# Patient Record
Sex: Male | Born: 1960 | Race: White | Hispanic: No | Marital: Married | Smoking: Current every day smoker
Health system: Southern US, Community
[De-identification: ages and names within clinical notes are randomized; demographics above are authoritative.]

## PROBLEM LIST (undated history)

## (undated) DIAGNOSIS — M792 Neuralgia and neuritis, unspecified: Secondary | ICD-10-CM

## (undated) DIAGNOSIS — M1990 Unspecified osteoarthritis, unspecified site: Secondary | ICD-10-CM

## (undated) DIAGNOSIS — I2510 Atherosclerotic heart disease of native coronary artery without angina pectoris: Secondary | ICD-10-CM

---

## 1999-11-25 ENCOUNTER — Encounter: Payer: Self-pay | Admitting: Neurosurgery

## 1999-11-29 ENCOUNTER — Observation Stay (HOSPITAL_COMMUNITY): Admission: RE | Admit: 1999-11-29 | Discharge: 1999-11-29 | Payer: Self-pay | Admitting: Neurosurgery

## 1999-11-29 ENCOUNTER — Encounter: Payer: Self-pay | Admitting: Neurosurgery

## 1999-12-13 ENCOUNTER — Encounter: Payer: Self-pay | Admitting: Neurosurgery

## 1999-12-13 ENCOUNTER — Encounter: Admission: RE | Admit: 1999-12-13 | Discharge: 1999-12-13 | Payer: Self-pay | Admitting: Neurosurgery

## 2000-01-16 ENCOUNTER — Encounter: Payer: Self-pay | Admitting: Neurosurgery

## 2000-01-16 ENCOUNTER — Encounter: Admission: RE | Admit: 2000-01-16 | Discharge: 2000-01-16 | Payer: Self-pay | Admitting: Neurosurgery

## 2014-03-13 ENCOUNTER — Other Ambulatory Visit: Payer: Self-pay | Admitting: Internal Medicine

## 2014-03-15 ENCOUNTER — Other Ambulatory Visit: Payer: Self-pay | Admitting: Internal Medicine

## 2016-11-11 ENCOUNTER — Emergency Department (HOSPITAL_BASED_OUTPATIENT_CLINIC_OR_DEPARTMENT_OTHER)
Admission: EM | Admit: 2016-11-11 | Discharge: 2016-11-11 | Disposition: A | Discharge status: Home / Self Care | Payer: Managed Care, Other (non HMO) | Attending: Emergency Medicine | Admitting: Emergency Medicine

## 2016-11-11 ENCOUNTER — Encounter (HOSPITAL_BASED_OUTPATIENT_CLINIC_OR_DEPARTMENT_OTHER): Payer: Self-pay | Admitting: Adult Health

## 2016-11-11 DIAGNOSIS — Z791 Long term (current) use of non-steroidal anti-inflammatories (NSAID): Secondary | ICD-10-CM | POA: Diagnosis not present

## 2016-11-11 DIAGNOSIS — F17210 Nicotine dependence, cigarettes, uncomplicated: Secondary | ICD-10-CM | POA: Insufficient documentation

## 2016-11-11 DIAGNOSIS — L03114 Cellulitis of left upper limb: Secondary | ICD-10-CM | POA: Diagnosis not present

## 2016-11-11 DIAGNOSIS — L02219 Cutaneous abscess of trunk, unspecified: Secondary | ICD-10-CM

## 2016-11-11 DIAGNOSIS — L02211 Cutaneous abscess of abdominal wall: Secondary | ICD-10-CM | POA: Diagnosis present

## 2016-11-11 DIAGNOSIS — L03311 Cellulitis of abdominal wall: Secondary | ICD-10-CM | POA: Diagnosis not present

## 2016-11-11 DIAGNOSIS — L03319 Cellulitis of trunk, unspecified: Secondary | ICD-10-CM

## 2016-11-11 HISTORY — DX: Neuralgia and neuritis, unspecified: M79.2

## 2016-11-11 HISTORY — DX: Unspecified osteoarthritis, unspecified site: M19.90

## 2016-11-11 LAB — BASIC METABOLIC PANEL
ANION GAP: 8 (ref 5–15)
BUN: 16 mg/dL (ref 6–20)
CALCIUM: 8.8 mg/dL — AB (ref 8.9–10.3)
CO2: 23 mmol/L (ref 22–32)
Chloride: 106 mmol/L (ref 101–111)
Creatinine, Ser: 1.21 mg/dL (ref 0.61–1.24)
GLUCOSE: 96 mg/dL (ref 65–99)
Potassium: 4.1 mmol/L (ref 3.5–5.1)
Sodium: 137 mmol/L (ref 135–145)

## 2016-11-11 LAB — CBC WITH DIFFERENTIAL/PLATELET
BASOS ABS: 0 10*3/uL (ref 0.0–0.1)
BASOS PCT: 0 %
EOS ABS: 0.3 10*3/uL (ref 0.0–0.7)
Eosinophils Relative: 3 %
HCT: 46.6 % (ref 39.0–52.0)
Hemoglobin: 15.8 g/dL (ref 13.0–17.0)
Lymphocytes Relative: 24 %
Lymphs Abs: 2 10*3/uL (ref 0.7–4.0)
MCH: 30.9 pg (ref 26.0–34.0)
MCHC: 33.9 g/dL (ref 30.0–36.0)
MCV: 91.2 fL (ref 78.0–100.0)
MONO ABS: 0.9 10*3/uL (ref 0.1–1.0)
Monocytes Relative: 11 %
Neutro Abs: 5 10*3/uL (ref 1.7–7.7)
Neutrophils Relative %: 62 %
PLATELETS: 163 10*3/uL (ref 150–400)
RBC: 5.11 MIL/uL (ref 4.22–5.81)
RDW: 13.9 % (ref 11.5–15.5)
WBC: 8.2 10*3/uL (ref 4.0–10.5)

## 2016-11-11 MED ORDER — CEPHALEXIN 250 MG PO CAPS
500.0000 mg | ORAL_CAPSULE | Freq: Once | ORAL | Status: AC
  Administered 2016-11-11: 500 mg via ORAL
  Filled 2016-11-11: qty 2

## 2016-11-11 MED ORDER — SULFAMETHOXAZOLE-TRIMETHOPRIM 800-160 MG PO TABS
1.0000 | ORAL_TABLET | Freq: Once | ORAL | Status: AC
  Administered 2016-11-11: 1 via ORAL
  Filled 2016-11-11: qty 1

## 2016-11-11 MED ORDER — SULFAMETHOXAZOLE-TRIMETHOPRIM 800-160 MG PO TABS: 1.0000 | ORAL_TABLET | Freq: Two times a day (BID) | ORAL | 0 refills | Status: AC

## 2016-11-11 MED ORDER — CEPHALEXIN 500 MG PO CAPS
500.0000 mg | ORAL_CAPSULE | Freq: Four times a day (QID) | ORAL | 0 refills | Status: DC
Start: 2016-11-11 — End: 2017-08-28

## 2016-11-11 MED ORDER — MUPIROCIN CALCIUM 2 % NA OINT: TOPICAL_OINTMENT | NASAL | 0 refills | Status: DC

## 2016-11-11 NOTE — ED Notes (Signed)
EDPA at [facility], wife at [facility], pt al;ert, NAD, calm, interactive.

## 2016-11-11 NOTE — Discharge Instructions (Signed)
Please read and follow all provided instructions.  Your diagnoses today include:  1. Cellulitis and abscess of trunk   2. Cellulitis of left hand     Tests performed today include:  Vital signs. See below for your results today.   Medications prescribed:   Bactrim (trimethoprim/sulfamethoxazole) - antibiotic  You have been prescribed an antibiotic medicine: take the entire course of medicine even if you are feeling better. Stopping early can cause the antibiotic not to work.   Keflex (cephalexin) - antibiotic  You have been prescribed an antibiotic medicine: take the entire course of medicine even if you are feeling better. Stopping early can cause the antibiotic not to work.  Take any prescribed medications only as directed.   Home care instructions:   Follow any educational materials contained in this packet  Follow-up instructions: Please follow-up with your primary care provider in 48 hours for a recheck.   Return instructions:  Return to the Emergency Department if you have:  Fever  Worsening symptoms  Worsening pain  Worsening swelling  Redness of the skin that moves away from the affected area, especially if it streaks away from the affected area   Any other emergent concerns  Additional Information: If you have recurrent abscesses, try both the following. Use a Qtip to apply an over-the-counter antibiotic to the inside of your nostrils, twice a day for 5 days. Wash your body with over-the-counter Hibaclens once a day for one week and then once every two weeks. This can reduce the amount of bacterial on your skin that causes boils and lead to fewer boils. If you continue to have multiple or recurrent boils, you should see a dermatologist (skin doctor).   Your vital signs today were: BP 144/89 (BP Location: Left Arm)    Pulse 80    Temp 98 F (36.7 C) (Oral)    Resp 18    Ht 5\' 11"  (1.803 m)    Wt 111.1 kg    SpO2 97%    BMI 34.17 kg/m  If your blood  pressure (BP) was elevated above 135/85 this visit, please have this repeated by your doctor within one month. --------------

## 2016-11-11 NOTE — ED Provider Notes (Signed)
[facility] Provider Note   CSN: [id number] Arrival date & time: 11/11/16  2006  By signing my name below, I, Emmanuella Mensah, attest that this documentation has been prepared under the direction and in the presence of Renne Crigler, PA-C. Electronically Signed: Angelene Giovanni, ED Scribe. 11/11/16. 10:10 PM.   History   Chief Complaint Chief Complaint  Patient presents with  . Abscess    HPI Comments: Larry [name] is a 55 y.o. male who presents to the Emergency Department complaining of gradually worsening moderately painful area of swelling, induration, and redness to the LLQ of his abdomen onset one week ago. He notes that the area began as a pimple and as it grew, he cut into the area with positive purulent drainage. No other alleviating factors noted. Pt has not tried any medications PTA. He has NKDA. He denies that he has been evaluated by a provider for these symptoms. He states that he has these recurrent areas in multiple places on his body, mainly his left hand. He denies any fever, chills, nausea, vomiting, or any other symptoms.   The history is provided by the patient. No language interpreter was used.    Past Medical History:  Diagnosis Date  . Arthritis   . Nerve pain     There are no active problems to display for this patient.   History reviewed. No pertinent surgical history.     Home Medications    Prior to Admission medications   Medication Sig Start Date End Date Taking? Authorizing Provider  gabapentin (NEURONTIN) 300 MG capsule Take 300 mg by mouth 3 (three) times daily.   Yes Historical Provider, MD  meloxicam (MOBIC) 15 MG tablet Take 15 mg by mouth daily.   Yes Historical Provider, MD  naproxen (NAPROSYN) 500 MG tablet Take 500 mg by mouth 2 (two) times daily with a meal.   Yes Historical Provider, MD  zolpidem (AMBIEN) 10 MG tablet Take 10 mg by mouth at bedtime as needed for sleep.   Yes Historical Provider, MD    Family  History History reviewed. No pertinent family history.  Social History Social History  Substance Use Topics  . Smoking status: Current Every Day Smoker    Types: Cigarettes  . Smokeless tobacco: Never Used  . Alcohol use No     Allergies   Patient has no known allergies.   Review of Systems Review of Systems  Constitutional: Negative for chills and fever.  HENT: Negative for rhinorrhea and sore throat.   Eyes: Negative for redness.  Respiratory: Negative for cough.   Cardiovascular: Negative for chest pain.  Gastrointestinal: Negative for abdominal pain, diarrhea, nausea and vomiting.  Genitourinary: Negative for dysuria.  Musculoskeletal: Negative for myalgias.  Skin: Positive for color change and wound. Negative for rash.       Positive for abscess  Neurological: Negative for headaches.  Hematological: Negative for adenopathy.   Physical Exam Updated Vital Signs BP 144/89 (BP Location: Left Arm)   Pulse 80   Temp 98 F (36.7 C) (Oral)   Resp 18   Ht 5\' 11"  (1.803 m)   Wt 245 lb (111.1 kg)   SpO2 97%   BMI 34.17 kg/m   Physical Exam  Constitutional: He appears well-developed and well-nourished.  HENT:  Head: Normocephalic and atraumatic.  Eyes: Conjunctivae are normal.  Neck: Normal range of motion. Neck supple.  Cardiovascular: Normal rate.   Pulmonary/Chest: Effort normal. No respiratory distress.  Musculoskeletal:  Left hand: 3  cm irregular area of cellulitis on the dorsum of the hand, between thumb and index fingers.   Left lower abdomen: 6cm x 3cm area of cellulitis and healing abscess. No drainage.   Neurological: He is alert.  Skin: Skin is warm and dry.  Psychiatric: He has a normal mood and affect.  Nursing note and vitals reviewed.        ED Treatments / Results  DIAGNOSTIC STUDIES: Oxygen Saturation is 97% on RA, normal by my interpretation.    COORDINATION OF CARE: 10:09 PM- Pt advised of plan for treatment and pt agrees. Pt will  receive Keflex and Bactrim. Advised to follow up with PCP. Recommended to use OTC Hibaclens and Bactroban for one week. Pt will keep area covered while at work.    Labs (all labs ordered are listed, but only abnormal results are displayed) Labs Reviewed  BASIC METABOLIC PANEL - Abnormal; Notable for the following:       Result Value   Calcium 8.8 (*)    All other components within normal limits  CBC WITH DIFFERENTIAL/PLATELET    Procedures Procedures (including critical care time)  Medications Ordered in ED Medications  sulfamethoxazole-trimethoprim (BACTRIM DS,SEPTRA DS) 800-160 MG per tablet 1 tablet (1 tablet Oral Given 11/11/16 2209)  cephALEXin (KEFLEX) capsule 500 mg (500 mg Oral Given 11/11/16 2210)     Initial Impression / Assessment and Plan / ED Course  Renne Crigler, PA-C has reviewed the triage vital signs and the nursing notes.  Pertinent labs & imaging results that were available during my care of the patient were reviewed by me and considered in my medical decision making (see chart for details).  Clinical Course    Patient seen and examined. Work-up initiated. Medications ordered.   Vital signs reviewed and are as follows: BP 132/78 (BP Location: Right Arm)   Pulse 64   Temp 98 F (36.7 C) (Oral)   Resp 18   Ht 5\' 11"  (1.803 m)   Wt 111.1 kg   SpO2 96%   BMI 34.17 kg/m   Patient updated on blood work results.   The patient was urged to return to the Emergency Department urgently with worsening pain, swelling, expanding erythema especially if it streaks away from the affected area, fever, or if they have any other concerns.   The patient was urged to return to the Emergency Department or go to their PCP in 48-72 hours for wound recheck.  The patient verbalized understanding and stated agreement with this plan.   Final Clinical Impressions(s) / ED Diagnoses   Final diagnoses:  Cellulitis and abscess of trunk  Cellulitis of left hand    New  Prescriptions Discharge Medication List as of 11/11/2016 10:51 PM    START taking these medications   Details  cephALEXin (KEFLEX) 500 MG capsule Take 1 capsule (500 mg total) by mouth 4 (four) times daily., Starting Sat 11/11/2016, Print    mupirocin nasal ointment (BACTROBAN) 2 % Apply in each nostril twice daily for 5 days., Print    sulfamethoxazole-trimethoprim (BACTRIM DS,SEPTRA DS) 800-160 MG tablet Take 1 tablet by mouth 2 (two) times daily., Starting Sat 11/11/2016, Until Sat 11/18/2016, Print       I personally performed the services described in this documentation, which was scribed in my presence. The recorded information has been reviewed and is accurate.     Renne Crigler, PA-C 11/11/16 6812    Arby Barrette, MD 11/13/16 0030

## 2016-11-11 NOTE — ED Notes (Signed)
ED PA at [facility] 

## 2016-11-11 NOTE — ED Triage Notes (Signed)
Presents with two red, indurated areas, one to left hand and one to left lower abdomen. He endorses pain and purulent drainage. Endorses cutting into the one on his hand and squeezing the opus out a couple days ago. Both sites are indurated and red. Denies fevers, denies chills reports not feeling well a couple days ago but is feeling okay today.

## 2017-08-28 ENCOUNTER — Inpatient Hospital Stay (HOSPITAL_COMMUNITY): Payer: 59

## 2017-08-28 ENCOUNTER — Inpatient Hospital Stay (HOSPITAL_COMMUNITY)
Admission: EM | Admit: 2017-08-28 | Discharge: 2017-08-30 | DRG: 638 | Disposition: A | Discharge status: Home / Self Care | Payer: 59 | Attending: Internal Medicine | Admitting: Internal Medicine

## 2017-08-28 ENCOUNTER — Emergency Department (HOSPITAL_COMMUNITY): Payer: 59

## 2017-08-28 ENCOUNTER — Encounter (HOSPITAL_COMMUNITY): Payer: Self-pay | Admitting: Family Medicine

## 2017-08-28 DIAGNOSIS — M4802 Spinal stenosis, cervical region: Secondary | ICD-10-CM | POA: Diagnosis not present

## 2017-08-28 DIAGNOSIS — R739 Hyperglycemia, unspecified: Secondary | ICD-10-CM | POA: Diagnosis present

## 2017-08-28 DIAGNOSIS — D696 Thrombocytopenia, unspecified: Secondary | ICD-10-CM | POA: Diagnosis present

## 2017-08-28 DIAGNOSIS — R7989 Other specified abnormal findings of blood chemistry: Secondary | ICD-10-CM | POA: Diagnosis not present

## 2017-08-28 DIAGNOSIS — T380X5A Adverse effect of glucocorticoids and synthetic analogues, initial encounter: Secondary | ICD-10-CM | POA: Diagnosis present

## 2017-08-28 DIAGNOSIS — M1990 Unspecified osteoarthritis, unspecified site: Secondary | ICD-10-CM | POA: Diagnosis present

## 2017-08-28 DIAGNOSIS — E0910 Drug or chemical induced diabetes mellitus with ketoacidosis without coma: Secondary | ICD-10-CM

## 2017-08-28 DIAGNOSIS — Z886 Allergy status to analgesic agent status: Secondary | ICD-10-CM | POA: Diagnosis not present

## 2017-08-28 DIAGNOSIS — Z79899 Other long term (current) drug therapy: Secondary | ICD-10-CM

## 2017-08-28 DIAGNOSIS — E1110 Type 2 diabetes mellitus with ketoacidosis without coma: Secondary | ICD-10-CM | POA: Diagnosis present

## 2017-08-28 DIAGNOSIS — R945 Abnormal results of liver function studies: Secondary | ICD-10-CM

## 2017-08-28 DIAGNOSIS — Z7952 Long term (current) use of systemic steroids: Secondary | ICD-10-CM | POA: Diagnosis not present

## 2017-08-28 DIAGNOSIS — E871 Hypo-osmolality and hyponatremia: Secondary | ICD-10-CM | POA: Diagnosis present

## 2017-08-28 DIAGNOSIS — Z791 Long term (current) use of non-steroidal anti-inflammatories (NSAID): Secondary | ICD-10-CM

## 2017-08-28 DIAGNOSIS — F17210 Nicotine dependence, cigarettes, uncomplicated: Secondary | ICD-10-CM | POA: Diagnosis present

## 2017-08-28 HISTORY — DX: Thrombocytopenia, unspecified: D69.6

## 2017-08-28 LAB — URINALYSIS, ROUTINE W REFLEX MICROSCOPIC
Bacteria, UA: NONE SEEN
Bilirubin Urine: NEGATIVE
HGB URINE DIPSTICK: NEGATIVE
KETONES UR: 5 mg/dL — AB
LEUKOCYTES UA: NEGATIVE
Nitrite: NEGATIVE
PROTEIN: NEGATIVE mg/dL
SQUAMOUS EPITHELIAL / LPF: NONE SEEN
Specific Gravity, Urine: 1.031 — ABNORMAL HIGH (ref 1.005–1.030)
pH: 5 (ref 5.0–8.0)

## 2017-08-28 LAB — CBC WITH DIFFERENTIAL/PLATELET
BASOS ABS: 0 10*3/uL (ref 0.0–0.1)
BASOS PCT: 0 %
Eosinophils Absolute: 0 10*3/uL (ref 0.0–0.7)
Eosinophils Relative: 0 %
HCT: 50 % (ref 39.0–52.0)
Hemoglobin: 17.4 g/dL — ABNORMAL HIGH (ref 13.0–17.0)
Lymphocytes Relative: 17 %
Lymphs Abs: 1.6 10*3/uL (ref 0.7–4.0)
MCH: 31.1 pg (ref 26.0–34.0)
MCHC: 34.8 g/dL (ref 30.0–36.0)
MCV: 89.3 fL (ref 78.0–100.0)
MONO ABS: 0.7 10*3/uL (ref 0.1–1.0)
Monocytes Relative: 7 %
NEUTROS ABS: 7 10*3/uL (ref 1.7–7.7)
Neutrophils Relative %: 76 %
PLATELETS: 109 10*3/uL — AB (ref 150–400)
RBC: 5.6 MIL/uL (ref 4.22–5.81)
RDW: 13.8 % (ref 11.5–15.5)
WBC: 9.3 10*3/uL (ref 4.0–10.5)

## 2017-08-28 LAB — BASIC METABOLIC PANEL
Anion gap: 10 (ref 5–15)
BUN: 17 mg/dL (ref 6–20)
CO2: 23 mmol/L (ref 22–32)
Calcium: 8 mg/dL — ABNORMAL LOW (ref 8.9–10.3)
Chloride: 103 mmol/L (ref 101–111)
Creatinine, Ser: 0.76 mg/dL (ref 0.61–1.24)
GLUCOSE: 289 mg/dL — AB (ref 65–99)
POTASSIUM: 3.5 mmol/L (ref 3.5–5.1)
Sodium: 136 mmol/L (ref 135–145)

## 2017-08-28 LAB — COMPREHENSIVE METABOLIC PANEL
ALBUMIN: 3.5 g/dL (ref 3.5–5.0)
ALT: 245 U/L — ABNORMAL HIGH (ref 17–63)
ANION GAP: 14 (ref 5–15)
AST: 53 U/L — AB (ref 15–41)
Alkaline Phosphatase: 141 U/L — ABNORMAL HIGH (ref 38–126)
BUN: 19 mg/dL (ref 6–20)
CHLORIDE: 95 mmol/L — AB (ref 101–111)
CO2: 21 mmol/L — ABNORMAL LOW (ref 22–32)
Calcium: 8.5 mg/dL — ABNORMAL LOW (ref 8.9–10.3)
Creatinine, Ser: 0.99 mg/dL (ref 0.61–1.24)
GFR calc Af Amer: >60 mL/min (ref >60)
GFR calc non Af Amer: >60 mL/min (ref >60)
GLUCOSE: 563 mg/dL — AB (ref 65–99)
POTASSIUM: 4.5 mmol/L (ref 3.5–5.1)
Sodium: 130 mmol/L — ABNORMAL LOW (ref 135–145)
Total Bilirubin: 1.5 mg/dL — ABNORMAL HIGH (ref 0.3–1.2)
Total Protein: 5.6 g/dL — ABNORMAL LOW (ref 6.5–8.1)

## 2017-08-28 LAB — CBG MONITORING, ED
GLUCOSE-CAPILLARY: 505 mg/dL — AB (ref 65–99)
Glucose-Capillary: 337 mg/dL — ABNORMAL HIGH (ref 65–99)

## 2017-08-28 LAB — GLUCOSE, CAPILLARY
GLUCOSE-CAPILLARY: 176 mg/dL — AB (ref 65–99)
Glucose-Capillary: 170 mg/dL — ABNORMAL HIGH (ref 65–99)
Glucose-Capillary: 243 mg/dL — ABNORMAL HIGH (ref 65–99)
Glucose-Capillary: 278 mg/dL — ABNORMAL HIGH (ref 65–99)

## 2017-08-28 LAB — MRSA PCR SCREENING: MRSA by PCR: NEGATIVE

## 2017-08-28 MED ORDER — ENOXAPARIN SODIUM 40 MG/0.4ML ~~LOC~~ SOLN
40.0000 mg | SUBCUTANEOUS | Status: DC
  Administered 2017-08-28: 40 mg via SUBCUTANEOUS
  Filled 2017-08-28: qty 0.4

## 2017-08-28 MED ORDER — DEXAMETHASONE 2 MG PO TABS
3.0000 mg | ORAL_TABLET | Freq: Three times a day (TID) | ORAL | Status: DC
  Administered 2017-08-28: 3 mg via ORAL
  Filled 2017-08-28: qty 2

## 2017-08-28 MED ORDER — ONDANSETRON HCL 4 MG/2ML IJ SOLN: 4.0000 mg | Freq: Four times a day (QID) | INTRAMUSCULAR | Status: DC | PRN

## 2017-08-28 MED ORDER — ZOLPIDEM TARTRATE 10 MG PO TABS: 10.0000 mg | ORAL_TABLET | Freq: Every evening | ORAL | Status: DC | PRN

## 2017-08-28 MED ORDER — CHLORHEXIDINE GLUCONATE 0.12 % MT SOLN
15.0000 mL | Freq: Two times a day (BID) | OROMUCOSAL | Status: DC
  Administered 2017-08-29 – 2017-08-30 (×4): 15 mL via OROMUCOSAL
  Filled 2017-08-28 (×3): qty 15

## 2017-08-28 MED ORDER — MELOXICAM 15 MG PO TABS
15.0000 mg | ORAL_TABLET | Freq: Every day | ORAL | Status: DC
  Administered 2017-08-28 – 2017-08-30 (×3): 15 mg via ORAL
  Filled 2017-08-28 (×3): qty 1

## 2017-08-28 MED ORDER — ORAL CARE MOUTH RINSE
15.0000 mL | Freq: Two times a day (BID) | OROMUCOSAL | Status: DC
  Administered 2017-08-29: 15 mL via OROMUCOSAL

## 2017-08-28 MED ORDER — DULOXETINE HCL 30 MG PO CPEP
60.0000 mg | ORAL_CAPSULE | Freq: Every day | ORAL | Status: DC
  Administered 2017-08-28 – 2017-08-30 (×3): 60 mg via ORAL
  Filled 2017-08-28 (×3): qty 2

## 2017-08-28 MED ORDER — SODIUM CHLORIDE 0.9 % IV SOLN
INTRAVENOUS | Status: DC
  Administered 2017-08-28: 19:00:00 via INTRAVENOUS

## 2017-08-28 MED ORDER — FAMOTIDINE 20 MG PO TABS
20.0000 mg | ORAL_TABLET | Freq: Two times a day (BID) | ORAL | Status: DC
  Administered 2017-08-28 – 2017-08-30 (×4): 20 mg via ORAL
  Filled 2017-08-28 (×4): qty 1

## 2017-08-28 MED ORDER — CALCIUM CARBONATE ANTACID 500 MG PO CHEW: 1.0000 | CHEWABLE_TABLET | Freq: Once | ORAL | Status: DC

## 2017-08-28 MED ORDER — GABAPENTIN 300 MG PO CAPS
300.0000 mg | ORAL_CAPSULE | Freq: Four times a day (QID) | ORAL | Status: DC
  Administered 2017-08-28 – 2017-08-30 (×6): 300 mg via ORAL
  Filled 2017-08-28 (×6): qty 1

## 2017-08-28 MED ORDER — SODIUM CHLORIDE 0.9 % IV BOLUS (SEPSIS)
2000.0000 mL | Freq: Once | INTRAVENOUS | Status: AC
  Administered 2017-08-28: 2000 mL via INTRAVENOUS

## 2017-08-28 MED ORDER — POTASSIUM CHLORIDE 10 MEQ/100ML IV SOLN
10.0000 meq | INTRAVENOUS | Status: AC
  Administered 2017-08-28: 10 meq via INTRAVENOUS
  Filled 2017-08-28: qty 100

## 2017-08-28 MED ORDER — DEXTROSE-NACL 5-0.45 % IV SOLN
INTRAVENOUS | Status: DC
  Administered 2017-08-28: 23:00:00 via INTRAVENOUS

## 2017-08-28 MED ORDER — SODIUM CHLORIDE 0.9 % IV SOLN
INTRAVENOUS | Status: DC
  Administered 2017-08-28: 2.8 [IU]/h via INTRAVENOUS
  Filled 2017-08-28: qty 1

## 2017-08-28 MED ORDER — ACETAMINOPHEN 325 MG PO TABS: 650.0000 mg | ORAL_TABLET | Freq: Four times a day (QID) | ORAL | Status: DC | PRN

## 2017-08-28 MED ORDER — HYDROCODONE-ACETAMINOPHEN 5-325 MG PO TABS: 1.0000 | ORAL_TABLET | ORAL | Status: DC | PRN

## 2017-08-28 NOTE — ED Notes (Signed)
RN notified of abnormal lab 

## 2017-08-28 NOTE — ED Provider Notes (Signed)
[facility]-EMERGENCY DEPT Provider Note   CSN: [id number] Arrival date & time: 08/28/17  1522     History   Chief Complaint Chief Complaint  Patient presents with  . Hyperglycemia    HPI Larry [name] is a 56 y.o. male.  Patient complains of weakness polydipsia polyuria. He was placed on Decadron 4 mg 3 times a day for approximately 3-4 weeks now after he had neck surgery.   The history is provided by the patient.  Weakness  Primary symptoms include no focal weakness. This is a new problem. The current episode started less than 1 hour ago. The problem has not changed since onset.There was no focality noted. There has been no fever. Pertinent negatives include no shortness of breath, no chest pain and no headaches. Associated medical issues do not include trauma.    Past Medical History:  Diagnosis Date  . Arthritis   . Nerve pain     Patient Active Problem List   Diagnosis Date Noted  . Cervical stenosis of spinal canal 08/28/2017  . Hyponatremia 08/28/2017  . Elevated LFTs 08/28/2017  . Steroid-induced hyperglycemia 08/28/2017  . DKA (diabetic ketoacidoses) (HCC) 08/28/2017    History reviewed. No pertinent surgical history.     Home Medications    Prior to Admission medications   Medication Sig Start Date End Date Taking? Authorizing Provider  acetaminophen (TYLENOL) 500 MG tablet Take 500 mg by mouth daily.   Yes [provider]  dexamethasone (DECADRON) 4 MG tablet Take 4 mg by mouth 3 (three) times daily. 07/24/17  Yes [provider]  DULoxetine (CYMBALTA) 60 MG capsule Take 60 mg by mouth daily. 08/07/17  Yes [provider]  famotidine (PEPCID) 20 MG tablet Take 20 mg by mouth 2 (two) times daily.   Yes [provider]  gabapentin (NEURONTIN) 300 MG capsule Take 300 mg by mouth 4 (four) times daily.    Yes [provider]  meloxicam (MOBIC) 15 MG tablet Take 15 mg by mouth daily.   Yes [provider]    zolpidem (AMBIEN) 10 MG tablet Take 10 mg by mouth at bedtime as needed for sleep.   Yes [provider]  cephALEXin (KEFLEX) 500 MG capsule Take 1 capsule (500 mg total) by mouth 4 (four) times daily. Patient not taking: Reported on 08/28/2017 11/11/16   Renne Crigler, PA-C  mupirocin nasal ointment (BACTROBAN) 2 % Apply in each nostril twice daily for 5 days. Patient not taking: Reported on 08/28/2017 11/11/16   Renne Crigler, PA-C    Family History History reviewed. No pertinent family history.  Social History Social History  Substance Use Topics  . Smoking status: Current Every Day Smoker    Types: Cigarettes  . Smokeless tobacco: Never Used  . Alcohol use No     Allergies   Aspirin   Review of Systems Review of Systems  Constitutional: Negative for appetite change and fatigue.  HENT: Negative for congestion, ear discharge and sinus pressure.   Eyes: Negative for discharge.  Respiratory: Negative for cough and shortness of breath.   Cardiovascular: Negative for chest pain.  Gastrointestinal: Negative for abdominal pain and diarrhea.  Genitourinary: Positive for frequency. Negative for hematuria.  Musculoskeletal: Negative for back pain.  Skin: Negative for rash.  Neurological: Positive for weakness. Negative for focal weakness, seizures and headaches.  Psychiatric/Behavioral: Negative for hallucinations.     Physical Exam Updated Vital Signs BP 136/85   Pulse 74   Temp 97.8 F (36.6 C)  Resp 18   Ht  (1.753 m)   Wt 108.9 kg (240 lb)   SpO2 96%   BMI 35.44 kg/m   Physical Exam  Constitutional: He is oriented to person, place, and time. He appears well-developed.  HENT:  Head: Normocephalic.  Tract mucous membranes  Eyes: Conjunctivae and EOM are normal. No scleral icterus.  Neck: Neck supple. No thyromegaly present.  Cardiovascular: Normal rate and regular rhythm.  Exam reveals no gallop and no friction rub.   No murmur  heard. Pulmonary/Chest: No stridor. He has no wheezes. He has no rales. He exhibits no tenderness.  Abdominal: He exhibits no distension. There is no tenderness. There is no rebound.  Musculoskeletal: Normal range of motion. He exhibits no edema.  Lymphadenopathy:    He has no cervical adenopathy.  Neurological: He is oriented to person, place, and time. He exhibits normal muscle tone. Coordination normal.  Skin: No rash noted. No erythema.  Psychiatric: He has a normal mood and affect. His behavior is normal.     ED Treatments / Results  Labs (all labs ordered are listed, but only abnormal results are displayed) Labs Reviewed  CBC WITH DIFFERENTIAL/PLATELET - Abnormal; Notable for the following:       Result Value   Hemoglobin 17.4 (*)    Platelets 109 (*)    All other components within normal limits  COMPREHENSIVE METABOLIC PANEL - Abnormal; Notable for the following:    Sodium 130 (*)    Chloride 95 (*)    CO2 21 (*)    Glucose, Bld 563 (*)    Calcium 8.5 (*)    Total Protein 5.6 (*)    AST 53 (*)    ALT 245 (*)    Alkaline Phosphatase 141 (*)    Total Bilirubin 1.5 (*)    All other components within normal limits  URINALYSIS, ROUTINE W REFLEX MICROSCOPIC - Abnormal; Notable for the following:    Color, Urine STRAW (*)    Specific Gravity, Urine 1.031 (*)    Glucose, UA >=500 (*)    Ketones, ur 5 (*)    All other components within normal limits  CBG MONITORING, ED - Abnormal; Notable for the following:    Glucose-Capillary 505 (*)    All other components within normal limits    EKG  EKG Interpretation None       Radiology Dg Chest Port 1 View  Result Date: 08/28/2017 CLINICAL DATA:  Hypoglycemia. Generalized "numbness" from the neck down. EXAM: PORTABLE CHEST 1 VIEW COMPARISON:  None. FINDINGS: Suboptimal inspiration accounts for mild atelectasis at the right lung base and accentuates the cardiac silhouette. Taking this into account, cardiac silhouette mildly  to moderately enlarged. Hilar and mediastinal contours otherwise unremarkable. Lungs otherwise clear without airspace consolidation. Normal pulmonary vascularity. No visible pleural effusions. Degenerative changes in both shoulder joints with calcified loose bodies in the joints. IMPRESSION: 1. Suboptimal inspiration accounts for right basilar atelectasis. No acute cardiopulmonary disease otherwise. 2. Mild-to-moderate cardiomegaly without pulmonary edema. Electronically Signed   By: Hulan Saas M.D.   On: 08/28/2017 15:56    Procedures Procedures (including critical care time)  Medications Ordered in ED Medications  sodium chloride 0.9 % bolus 2,000 mL (2,000 mLs Intravenous New Bag/Given 08/28/17 1605)     Initial Impression / Assessment and Plan / ED Course  I have reviewed the triage vital signs and the nursing notes.  Pertinent labs & imaging results that were available during my care of the  patient were reviewed by me and considered in my medical decision making (see chart for details). Patient with  Hyperglycemia.  Patient will be admitted to medicine    Final Clinical Impressions(s) / ED Diagnoses   Final diagnoses:  Hyperglycemia    New Prescriptions New Prescriptions   No medications on file     Bethann Berkshire, MD 08/28/17 1844

## 2017-08-28 NOTE — H&P (Signed)
History and Physical    Larry [name] WGN:[id number] DOB: 08-05-1961 DOA: 08/28/2017  PCP: Patient, No Pcp Per   Patient coming from: Home  Chief Complaint: Polyuria, polydipsia, malaise  HPI: Larry [name] is a 56 y.o. male with medical history significant for osteoarthritis with cervical spinal stenosis status post discectomy last month, now presenting to the emergency department for generalized weakness, polyuria, polydipsia, and malaise.Patient reports that he been recovering well from the surgery he developed the aforementioned symptoms approximately 4 days ago. Since that time, he has had persistent polyuria, polydipsia, generalized weakness, and malaise. Denies headache, change in vision or hearing, or focal numbness or weakness, and denies chest pain or palpitations. There has not been any fevers, chills, dyspnea, or cough associated with this. He has never experienced similar symptoms previously. He has been taking Decadron 4 mg 3 times daily since the surgery. Denies any history of diabetes mellitus. No dysuria or flank pain.   ED Course: Upon arrival to the ED, patient is found to be afebrile, saturating well on room air, and with vitals otherwise stable.Chest x-ray is notable for mild to moderate cardiomegaly without pulmonary edema, and is otherwise negative for acute cardiopulmonary disease.Chemistry panel reveals a sodium of 1:30, glucose 563, total bilirubin 1.5, and ALT of 245. CBC is notable for a new thrombocytopenia with platelets 109,000. Urinalysis features glucosuria and ketonuria. Patient was treated with 2 L normal saline and glucose remained greater than 500. He has remained hemodynamically stable, in no apparent respiratory distress, and will be admitted to stepdown unit for ongoing evaluation and management of mild DKA, possibly steroid-induced.  Review of Systems:  All other systems reviewed and apart from HPI, are negative.  Past Medical History:  Diagnosis Date  .  Arthritis   . Nerve pain     History reviewed. No pertinent surgical history.   reports that he has been smoking Cigarettes.  He has never used smokeless tobacco. He reports that he does not drink alcohol or use drugs.  Allergies  Allergen Reactions  . Aspirin     Bleeding    History reviewed. No pertinent family history.   Prior to Admission medications   Medication Sig Start Date End Date Taking? Authorizing Provider  acetaminophen (TYLENOL) 500 MG tablet Take 500 mg by mouth daily.   Yes [provider]  dexamethasone (DECADRON) 4 MG tablet Take 4 mg by mouth 3 (three) times daily. 07/24/17  Yes [provider]  DULoxetine (CYMBALTA) 60 MG capsule Take 60 mg by mouth daily. 08/07/17  Yes [provider]  famotidine (PEPCID) 20 MG tablet Take 20 mg by mouth 2 (two) times daily.   Yes [provider]  gabapentin (NEURONTIN) 300 MG capsule Take 300 mg by mouth 4 (four) times daily.    Yes [provider]  meloxicam (MOBIC) 15 MG tablet Take 15 mg by mouth daily.   Yes [provider]  zolpidem (AMBIEN) 10 MG tablet Take 10 mg by mouth at bedtime as needed for sleep.   Yes [provider]    Physical Exam: Vitals:   08/28/17 1626 08/28/17 1717 08/28/17 1759 08/28/17 1836  BP: (!) 140/97 126/84 134/86 136/85  Pulse: 79 83 73 74  Resp: Temp:   97.8 F (36.6 C)   TempSrc:      SpO2: 94% 97% 98% 96%  Weight:      Height:          Constitutional:  No respiratory distress, calm, lethargic, easily roused Eyes: PERTLA, lids and conjunctivae normal ENMT: Mucous membranes are dry. Posterior pharynx clear of any exudate or lesions.   Neck: normal, supple, no masses, no thyromegaly Respiratory: Slightly diminished bilaterally, no wheezing, no crackles. Normal respiratory effort.   Cardiovascular: S1 & S2 heard, regular rate and rhythm. No extremity edema. No significant JVD. Abdomen: No distension, mild  generalized tenderness. No masses palpated. Bowel sounds active.  Musculoskeletal: no clubbing / cyanosis. No joint deformity upper and lower extremities.   Skin: no significant rashes, lesions, ulcers. Poor turgor. Neurologic: CN 2-12 grossly intact. Sensation intact. Strength 5/5 in all 4 limbs.  Psychiatric: Alert and oriented x 3. Calm and cooperative.     Labs on Admission: I have personally reviewed following labs and imaging studies  CBC:  Recent Labs Lab 08/28/17 1605  WBC 9.3  NEUTROABS 7.0  HGB 17.4*  HCT 50.0  MCV 89.3  PLT 109*   Basic Metabolic Panel:  Recent Labs Lab 08/28/17 1605  NA 130*  K 4.5  CL 95*  CO2 21*  GLUCOSE 563*  BUN 19  CREATININE 0.99  CALCIUM 8.5*   GFR: Estimated Creatinine Clearance: 102.6 mL/min (by C-G formula based on SCr of 0.99 mg/dL). Liver Function Tests:  Recent Labs Lab 08/28/17 1605  AST 53*  ALT 245*  ALKPHOS 141*  BILITOT 1.5*  PROT 5.6*  ALBUMIN 3.5   No results for input(s): LIPASE, AMYLASE in the last 168 hours. No results for input(s): AMMONIA in the last 168 hours. Coagulation Profile: No results for input(s): INR, PROTIME in the last 168 hours. Cardiac Enzymes: No results for input(s): CKTOTAL, CKMB, CKMBINDEX, TROPONINI in the last 168 hours. BNP (last 3 results) No results for input(s): PROBNP in the last 8760 hours. HbA1C: No results for input(s): HGBA1C in the last 72 hours. CBG:  Recent Labs Lab 08/28/17 1722  GLUCAP 505*   Lipid Profile: No results for input(s): CHOL, HDL, LDLCALC, TRIG, CHOLHDL, LDLDIRECT in the last 72 hours. Thyroid Function Tests: No results for input(s): TSH, T4TOTAL, FREET4, T3FREE, THYROIDAB in the last 72 hours. Anemia Panel: No results for input(s): VITAMINB12, FOLATE, FERRITIN, TIBC, IRON, RETICCTPCT in the last 72 hours. Urine analysis:    Component Value Date/Time   COLORURINE STRAW (A) 08/28/2017 1801   APPEARANCEUR CLEAR 08/28/2017 1801   LABSPEC 1.031  (H) 08/28/2017 1801   PHURINE 5.0 08/28/2017 1801   GLUCOSEU >=500 (A) 08/28/2017 1801   HGBUR NEGATIVE 08/28/2017 1801   BILIRUBINUR NEGATIVE 08/28/2017 1801   KETONESUR 5 (A) 08/28/2017 1801   PROTEINUR NEGATIVE 08/28/2017 1801   NITRITE NEGATIVE 08/28/2017 1801   LEUKOCYTESUR NEGATIVE 08/28/2017 1801   Sepsis Labs: (procalcitonin:4,lacticidven:4) )No results found for this or any previous visit (from the past 240 hour(s)).   Radiological Exams on Admission: Dg Chest Port 1 View  Result Date: 08/28/2017 CLINICAL DATA:  Hypoglycemia. Generalized "numbness" from the neck down. EXAM: PORTABLE CHEST 1 VIEW COMPARISON:  None. FINDINGS: Suboptimal inspiration accounts for mild atelectasis at the right lung base and accentuates the cardiac silhouette. Taking this into account, cardiac silhouette mildly to moderately enlarged. Hilar and mediastinal contours otherwise unremarkable. Lungs otherwise clear without airspace consolidation. Normal pulmonary vascularity. No visible pleural effusions. Degenerative changes in both shoulder joints with calcified loose bodies in the joints. IMPRESSION: 1. Suboptimal inspiration accounts for right basilar atelectasis. No acute cardiopulmonary disease otherwise. 2. Mild-to-moderate cardiomegaly without pulmonary edema. Electronically Signed   By: Hulan Saas  M.D.   On: 08/28/2017 15:56    EKG: Not performed.   Assessment/Plan  1. DKA, mild  - Pt presents with 4 days of gen weakness, malaise, polyuria, and polydipsia  - Found to have serum glucose of 563 with urine ketones and depressed serum bicarbonate  - No hx of DM; likely secondary to Decadron, which he has been taking 4 mg TID since the cervical spine surgery almost 1 month ago  - He was given 2 liters NS in ED, but CBG remained >500  - Plan to start insulin infusion with frequent CBG's, continue IVF hydration, follow serial chem panels, reduce Decadron to 3 mg  2. Cervical spinal  stenosis  - Pt underwent discectomy from anterior approach last month  - He was scheduled to follow-up with NSG at [facility] the day of admission, but was too ill  - He has been managed with Mobic and Decadron 4 mg TID  - Plan to continue Mobic; in light of marked hyperglycemia, may need to taper Decadron  3. Hyponatremia  - Serum sodium is 130 in setting of hypovolemia and marked hyperglycemia  - Anticipate resolution with treatment of #1   - Following serial chem panels    4. Elevated LFT's  - Uncertain etiology  - Mild generalized abdominal discomfort in setting of DKA  - Check RUQ [place], repeat CMP in am    5. Thrombocytopenia  - Platelets 109,000 on admission, previously wnl  - No bleeding identified, other cell-lines okay  - Confirm with repeat CBC in am    DVT prophylaxis: sq Lovenox  Code Status: Full  Family Communication: Family updated at bedside Disposition Plan: Admit to SDU Consults called: None Admission status: Inpatient    Briscoe Deutscher, MD [facility] Pager [phone]  If 7PM-7AM, please contact night-coverage [email or web address] Password [facility]  08/28/2017, 6:42 PM

## 2017-08-28 NOTE — ED Triage Notes (Addendum)
Pt arrives to [facility] via [facility] c/o weakness, increased thirst, urinary frequency. EMS reports blood glucose of 576. No history of diabetes. Pt's wife reports pt has been forgetful since yesterday. Pt reports numbness and tingling from neck down.

## 2017-08-28 NOTE — Care Management (Signed)
CM consult reviewed. Patient is being admitted. CM will follow for discharge needs. Verenise Moulin RN CCM  

## 2017-08-28 NOTE — ED Notes (Signed)
Pt cannot use restroom at this time, aware urine specimen is needed.  

## 2017-08-29 LAB — BASIC METABOLIC PANEL
Anion gap: 8 (ref 5–15)
Anion gap: 8 (ref 5–15)
BUN: 16 mg/dL (ref 6–20)
BUN: 16 mg/dL (ref 6–20)
CALCIUM: 8.1 mg/dL — AB (ref 8.9–10.3)
CO2: 25 mmol/L (ref 22–32)
CO2: 24 mmol/L (ref 22–32)
CREATININE: 0.61 mg/dL (ref 0.61–1.24)
Calcium: 8.2 mg/dL — ABNORMAL LOW (ref 8.9–10.3)
Chloride: 104 mmol/L (ref 101–111)
Chloride: 104 mmol/L (ref 101–111)
Creatinine, Ser: 0.7 mg/dL (ref 0.61–1.24)
GFR calc Af Amer: >60 mL/min (ref >60)
GFR calc Af Amer: >60 mL/min (ref >60)
GFR calc non Af Amer: >60 mL/min (ref >60)
GLUCOSE: 176 mg/dL — AB (ref 65–99)
GLUCOSE: 179 mg/dL — AB (ref 65–99)
POTASSIUM: 3.6 mmol/L (ref 3.5–5.1)
Potassium: 3.7 mmol/L (ref 3.5–5.1)
Sodium: 137 mmol/L (ref 135–145)
Sodium: 136 mmol/L (ref 135–145)

## 2017-08-29 LAB — HEPATIC FUNCTION PANEL
ALK PHOS: 95 U/L (ref 38–126)
ALT: 192 U/L — AB (ref 17–63)
AST: 45 U/L — ABNORMAL HIGH (ref 15–41)
Albumin: 2.9 g/dL — ABNORMAL LOW (ref 3.5–5.0)
BILIRUBIN DIRECT: 0.2 mg/dL (ref 0.1–0.5)
BILIRUBIN INDIRECT: 0.7 mg/dL (ref 0.3–0.9)
TOTAL PROTEIN: 4.9 g/dL — AB (ref 6.5–8.1)
Total Bilirubin: 0.9 mg/dL (ref 0.3–1.2)

## 2017-08-29 LAB — HIV ANTIBODY (ROUTINE TESTING W REFLEX): HIV Screen 4th Generation wRfx: NONREACTIVE

## 2017-08-29 LAB — GLUCOSE, CAPILLARY
GLUCOSE-CAPILLARY: 180 mg/dL — AB (ref 65–99)
GLUCOSE-CAPILLARY: 202 mg/dL — AB (ref 65–99)
GLUCOSE-CAPILLARY: 221 mg/dL — AB (ref 65–99)
GLUCOSE-CAPILLARY: 357 mg/dL — AB (ref 65–99)
GLUCOSE-CAPILLARY: 330 mg/dL — AB (ref 65–99)
Glucose-Capillary: 176 mg/dL — ABNORMAL HIGH (ref 65–99)
Glucose-Capillary: 173 mg/dL — ABNORMAL HIGH (ref 65–99)
Glucose-Capillary: 189 mg/dL — ABNORMAL HIGH (ref 65–99)

## 2017-08-29 LAB — HEMOGLOBIN A1C
Hgb A1c MFr Bld: 10.3 % — ABNORMAL HIGH (ref 4.8–5.6)
Mean Plasma Glucose: 248.91 mg/dL

## 2017-08-29 MED ORDER — INSULIN ASPART 100 UNIT/ML ~~LOC~~ SOLN
0.0000 [IU] | Freq: Every day | SUBCUTANEOUS | Status: DC
  Administered 2017-08-29: 4 [IU] via SUBCUTANEOUS

## 2017-08-29 MED ORDER — INSULIN GLARGINE 100 UNIT/ML ~~LOC~~ SOLN: 12.0000 [IU] | Freq: Every day | SUBCUTANEOUS | Status: DC

## 2017-08-29 MED ORDER — INSULIN GLARGINE 100 UNIT/ML ~~LOC~~ SOLN
15.0000 [IU] | Freq: Every day | SUBCUTANEOUS | Status: DC
  Administered 2017-08-29: 15 [IU] via SUBCUTANEOUS
  Filled 2017-08-29 (×2): qty 0.15

## 2017-08-29 MED ORDER — INSULIN STARTER KIT- PEN NEEDLES (ENGLISH)
1.0000 | Freq: Once | Status: AC
  Administered 2017-08-29: 1
  Filled 2017-08-29: qty 1

## 2017-08-29 MED ORDER — LIVING WELL WITH DIABETES BOOK
Freq: Once | Status: AC
  Administered 2017-08-29: 09:00:00
  Filled 2017-08-29: qty 1

## 2017-08-29 MED ORDER — INSULIN GLARGINE 100 UNIT/ML ~~LOC~~ SOLN
8.0000 [IU] | Freq: Every day | SUBCUTANEOUS | Status: DC
  Administered 2017-08-29: 8 [IU] via SUBCUTANEOUS
  Filled 2017-08-29: qty 0.08

## 2017-08-29 MED ORDER — DEXAMETHASONE 2 MG PO TABS
3.0000 mg | ORAL_TABLET | Freq: Two times a day (BID) | ORAL | Status: DC
  Administered 2017-08-29 – 2017-08-30 (×3): 3 mg via ORAL
  Filled 2017-08-29 (×3): qty 2

## 2017-08-29 MED ORDER — PANTOPRAZOLE SODIUM 40 MG PO TBEC
40.0000 mg | DELAYED_RELEASE_TABLET | Freq: Every day | ORAL | Status: DC
  Administered 2017-08-29 – 2017-08-30 (×2): 40 mg via ORAL
  Filled 2017-08-29 (×2): qty 1

## 2017-08-29 MED ORDER — INSULIN ASPART 100 UNIT/ML ~~LOC~~ SOLN
0.0000 [IU] | Freq: Three times a day (TID) | SUBCUTANEOUS | Status: DC
  Administered 2017-08-29: 3 [IU] via SUBCUTANEOUS
  Administered 2017-08-29: 9 [IU] via SUBCUTANEOUS
  Administered 2017-08-29: 3 [IU] via SUBCUTANEOUS
  Administered 2017-08-30: 5 [IU] via SUBCUTANEOUS
  Administered 2017-08-30: 9 [IU] via SUBCUTANEOUS

## 2017-08-29 NOTE — Progress Notes (Signed)
Spoke with pt & his wife when giving the 10pm insulin and provided instruction on using insulin syringe to give himself injection and they report being instructed on the Pen.  It was mentioned that right now they have no insurance now as the [employer] has not kicked in because they do not have the money to pay the [facility] payment, thus making it harder for them to purchase the needed diabetic supplies upon discharge.

## 2017-08-29 NOTE — Progress Notes (Signed)
Received pt from ICU via wheelchair with nursing staff and family present. Pt denies pain at this time. Pts assessment is unchanged from previous documentation. Will continue to monitor.

## 2017-08-29 NOTE — Progress Notes (Signed)
PROGRESS NOTE        PATIENT DETAILS Name: Larry [name] Age: 56 y.o. Sex: male Date of Birth: 1961/01/08 Admit Date: 08/28/2017 Admitting Physician Briscoe Deutscher, MD RUE:[id number], No Pcp Per  Brief Narrative: Patient is a 56 y.o. male history of cervical stenosis with C5/C6 myelopathy-status post C5/C6 cervical anterior discectomy on 07/30/2017-subsequently placed on Decadron-admitted for mild DKA. Initially placed on insulin infusion, however upon improvement and resolution of anion gap acidosis-transition to subcutaneous insulin. See below for further details.  Subjective: He feels better-no longer has polyuria or polydipsia.  Assessment/Plan: DKA: Resolved-no longer on insulin infusion-transition to subcutaneous insulin.  DM-2: Probably had underlying diabetes-she was probably worsened due to addition of Decadron post cervical surgery. Await A1c-will plan on discharging patient on insulin. Increase Lantus to 15 units, continue SSI. Probably will add metformin on discharge. Begin insulin administration education, show patient diabetic videos-we'll have the patient be evaluated by diabetic coordinator as well.  Elevated liver enzymes: Not sure what the exact etiology is-his AST is significantly elevated compared to his AST-is not having any abdominal pain, Limited RUQ ultrasound showed a fatty liver but without any acute abnormalities. Plans are to follow-hopefully the enzymes were down trend further-if not-we can then consider further workup.  History of cervical stenosis with C5/C6 myelopathy-s/p C5/C6 cervical anterior discectomy on 07/30/2017: Per spouse-he is almost done with a month course of Decadron-we will start tapering. Suspect his sugars will further improve once he is off the Decadron. Per's patients spouse-patient continues to have tingling and numbness even after cervical spine surgery-he does have follow-up with neurosurgery/neurology-since these  are chronic issues-we will defer further the outpatient setting. We will obtain PT evaluation prior to discharge.  Hyponatremia: Suspect this was pseudohyponatremia in a setting of uncontrolled diabetes-this has resolved. Follow electrolytes periodically.  Mild thrombocytopenia: Unsure what the etiology is-but no evidence of bleeding-appears very mild-should be stable for outpatient follow-up.  Telemetry (independently reviewed):NSR  Labs/Imaging ordered: yes  DVT Prophylaxis: SCD's  Code Status: Full code  Family Communication: Spouse at bedside  Disposition Plan: Remain inpatient-transfer to MedSurg-hopefully home on 9/13  Antimicrobial agents: Anti-infectives    None      Procedures: None  CONSULTS:  None  Time spent: 25 minutes-Greater than 50% of this time was spent in counseling, explanation of diagnosis, planning of further management, and coordination of care.  MEDICATIONS: Scheduled Meds: . calcium carbonate  1 tablet Oral Once  . chlorhexidine  15 mL Mouth Rinse BID  . dexamethasone  3 mg Oral Q12H  . DULoxetine  60 mg Oral Daily  . enoxaparin (LOVENOX) injection  40 mg Subcutaneous Q24H  . famotidine  20 mg Oral BID  . gabapentin  300 mg Oral QID  . insulin aspart  0-5 Units Subcutaneous QHS  . insulin aspart  0-9 Units Subcutaneous TID WC  . insulin glargine  15 Units Subcutaneous QHS  . mouth rinse  15 mL Mouth Rinse q12n4p  . meloxicam  15 mg Oral Daily  . pantoprazole  40 mg Oral Daily   Continuous Infusions: . insulin (NOVOLIN-R) infusion Stopped (08/29/17 0431)   PRN Meds:.acetaminophen, HYDROcodone-acetaminophen, ondansetron (ZOFRAN) IV, zolpidem   PHYSICAL EXAM: Vital signs: Vitals:   08/28/17 2300 08/29/17 0000 08/29/17 0341 08/29/17 0400  BP: 126/82 (!) 111/94  (!) 129/95  Pulse: 72 66  65  Resp: Temp: 98.1 F (36.7 C)  97.7 F (36.5 C)   TempSrc: Axillary  Oral   SpO2: 96% 95%  94%  Weight:      Height:        Filed Weights   08/28/17 1543 08/28/17 1907 08/28/17 2000  Weight: 108.9 kg (240 lb) 108.9 kg (240 lb) 97.8 kg (215 lb 9.8 oz)   Body mass index is 31.84 kg/m.   General appearance :Awake, alert, not in any distress. Speech Clear. Not toxic Looking Eyes:, pupils equally reactive to light and accomodation,no scleral icterus.Pink conjunctiva HEENT: Atraumatic and Normocephalic Neck: supple, no JVD. No cervical lymphadenopathy. No thyromegaly Resp:Good air entry bilaterally, no added sounds  CVS: S1 S2 regular, no murmurs.  GI: Bowel sounds present, Non tender and not distended with no gaurding, rigidity or rebound.No organomegaly Extremities: B/L Lower Ext shows no edema, both legs are warm to touch Neurology:  speech clear,Non focal Psychiatric: Normal judgment and insight. Alert and oriented x 3. Normal mood. Musculoskeletal:No digital cyanosis Skin:No Rash, warm and dry Wounds:N/A  I have personally reviewed following labs and imaging studies  LABORATORY DATA: CBC:  Recent Labs Lab 08/28/17 1605  WBC 9.3  NEUTROABS 7.0  HGB 17.4*  HCT 50.0  MCV 89.3  PLT 109*    Basic Metabolic Panel:  Recent Labs Lab 08/28/17 1605 08/28/17 2051 08/29/17 0050 08/29/17 0317  NA 130* 136 137 136  K 4.5 3.5 3.6 3.7  CL 95* 103 104 104  CO2 21* GLUCOSE 563* 289* 179* 176*  BUN CREATININE 0.99 0.76 0.70 0.61  CALCIUM 8.5* 8.0* 8.2* 8.1*    GFR: Estimated Creatinine Clearance: 120.3 mL/min (by C-G formula based on SCr of 0.61 mg/dL).  Liver Function Tests:  Recent Labs Lab 08/28/17 1605 08/29/17 0317  AST 53* 45*  ALT 245* 192*  ALKPHOS 141* 95  BILITOT 1.5* 0.9  PROT 5.6* 4.9*  ALBUMIN 3.5 2.9*   No results for input(s): LIPASE, AMYLASE in the last 168 hours. No results for input(s): AMMONIA in the last 168 hours.  Coagulation Profile: No results for input(s): INR, PROTIME in the last 168 hours.  Cardiac Enzymes: No results for  input(s): CKTOTAL, CKMB, CKMBINDEX, TROPONINI in the last 168 hours.  BNP (last 3 results) No results for input(s): PROBNP in the last 8760 hours.  HbA1C: No results for input(s): HGBA1C in the last 72 hours.  CBG:  Recent Labs Lab 08/28/17 2354 08/29/17 0103 08/29/17 0155 08/29/17 0312 08/29/17 0414  GLUCAP 170* 180* 176* 173* 189*    Lipid Profile: No results for input(s): CHOL, HDL, LDLCALC, TRIG, CHOLHDL, LDLDIRECT in the last 72 hours.  Thyroid Function Tests: No results for input(s): TSH, T4TOTAL, FREET4, T3FREE, THYROIDAB in the last 72 hours.  Anemia Panel: No results for input(s): VITAMINB12, FOLATE, FERRITIN, TIBC, IRON, RETICCTPCT in the last 72 hours.  Urine analysis:    Component Value Date/Time   COLORURINE STRAW (A) 08/28/2017 1801   APPEARANCEUR CLEAR 08/28/2017 1801   LABSPEC 1.031 (H) 08/28/2017 1801   PHURINE 5.0 08/28/2017 1801   GLUCOSEU >=500 (A) 08/28/2017 1801   HGBUR NEGATIVE 08/28/2017 1801   BILIRUBINUR NEGATIVE 08/28/2017 1801   KETONESUR 5 (A) 08/28/2017 1801   PROTEINUR NEGATIVE 08/28/2017 1801   NITRITE NEGATIVE 08/28/2017 1801   LEUKOCYTESUR NEGATIVE 08/28/2017 1801    Sepsis Labs: Lactic Acid, Venous No results found for: LATICACIDVEN  MICROBIOLOGY: Recent  Results (from the past 240 hour(s))  MRSA PCR Screening     Status: None   Collection Time: 08/28/17  9:00 PM  Result Value Ref Range Status   MRSA by PCR NEGATIVE NEGATIVE Final    Comment:        The GeneXpert MRSA Assay (FDA approved for NASAL specimens only), is one component of a comprehensive MRSA colonization surveillance program. It is not intended to diagnose MRSA infection nor to guide or monitor treatment for MRSA infections.     RADIOLOGY STUDIES/RESULTS: Dg Chest Port 1 View  Result Date: 08/28/2017 CLINICAL DATA:  Hypoglycemia. Generalized "numbness" from the neck down. EXAM: PORTABLE CHEST 1 VIEW COMPARISON:  None. FINDINGS: Suboptimal inspiration  accounts for mild atelectasis at the right lung base and accentuates the cardiac silhouette. Taking this into account, cardiac silhouette mildly to moderately enlarged. Hilar and mediastinal contours otherwise unremarkable. Lungs otherwise clear without airspace consolidation. Normal pulmonary vascularity. No visible pleural effusions. Degenerative changes in both shoulder joints with calcified loose bodies in the joints. IMPRESSION: 1. Suboptimal inspiration accounts for right basilar atelectasis. No acute cardiopulmonary disease otherwise. 2. Mild-to-moderate cardiomegaly without pulmonary edema. Electronically Signed   By: Hulan Saas M.D.   On: 08/28/2017 15:56   US Abdomen Limited Ruq  Result Date: 08/28/2017 CLINICAL DATA:  Elevated LFTs EXAM: ULTRASOUND ABDOMEN LIMITED RIGHT UPPER QUADRANT COMPARISON:  None. FINDINGS: Gallbladder: No gallstones or wall thickening visualized. No sonographic Murphy sign noted by sonographer. Common bile duct: Diameter: 3.8 mm. Liver: Diffuse increased echogenicity is noted consistent with fatty infiltration. No focal lesion is noted. Portal vein is patent on color Doppler imaging with normal direction of blood flow towards the liver. IMPRESSION: Fatty liver. No other focal abnormality is noted. Electronically Signed   By: Alcide Clever M.D.   On: 08/28/2017 20:13     LOS: 1 day   Jeoffrey Massed, MD  [facility] Pager:[phone]  If 7PM-7AM, please contact night-coverage [email or web address] Password [facility] 08/29/2017, 7:18 AM

## 2017-08-29 NOTE — Progress Notes (Signed)
Inpatient Diabetes Program Recommendations  AACE/ADA: New Consensus Statement on Inpatient Glycemic Control (2015)  Target Ranges:  Prepandial:   less than 140 mg/dL      Peak postprandial:   less than 180 mg/dL (1-2 hours)      Critically ill patients:  140 - 180 mg/dL   Lab Results  Component Value Date   GLUCAP 202 (H) 08/29/2017    Review of Glycemic Control  Diabetes history: No prior hx  Inpatient Diabetes Program Recommendations:   Spoke with pt and wife @ bedside about new diagnosis. Discussed A1C results with them and explained what an A1C is, basic pathophysiology of DM Type 2, basic home care, basic diabetes diet nutrition principles, importance of checking CBGs and maintaining good CBG control to prevent long-term and short-term complications. Reviewed signs and symptoms of hyperglycemia and hypoglycemia and how to treat hypoglycemia at home. Also reviewed blood sugar goals at home.  RNs to provide ongoing basic DM education at bedside with this patient. Have ordered educational booklet, insulin starter kit, and DM videos. Have also placed RD consult for DM diet education for this patient.  Patient's fasting blood glucose @ [facility] was 198 on 07/27/17. Patient states he has been drinking "a lot" of mountain dew and eating a lot of ice cream since he has had symptoms of dry mouth. Will follow while hospitalized. Educated patient and spouse on insulin pen use at home. Reviewed contents of insulin flexpen starter kit. Reviewed all steps if insulin pen including attachment of needle, 2-unit air shot, dialing up dose, giving injection, removing needle, disposal of sharps, storage of unused insulin, disposal of insulin etc. Patient able to provide successful return demonstration. Also reviewed troubleshooting with insulin pen. MD to give patient Rxs for insulin pens and insulin pen needles if discharged in insulin.  Thank you, Nani Gasser. Jayel Scaduto, RN, MSN, CDE  Diabetes Coordinator Inpatient  Glycemic Control Team Team Pager [phone] (8am-5pm) 08/29/2017 10:01 AM

## 2017-08-29 NOTE — Care Management Note (Signed)
Case Management Note  Patient Details  Name: Larry [name] MRN: [id number] Date of Birth: 12/20/1960  Subjective/Objective:   dka                 Action/Plan: Date:  August 29, 2017 Chart reviewed for concurrent status and case management needs. Will continue to follow patient progress. Discharge Planning: following for needs Expected discharge date: [id number] Marcelle Smiling, BSN, [place], [place]   [phone] Expected Discharge Date:   (unknown)               Expected Discharge Plan:  Home/Self Care  In-House Referral:     Discharge planning Services  CM Consult  Post Acute Care Choice:    Choice offered to:     DME Arranged:    DME Agency:     HH Arranged:    HH Agency:     Status of Service:  In process, will continue to follow  If discussed at Long Length of Stay Meetings, dates discussed:    Additional Comments:  Golda Acre, RN 08/29/2017, 8:17 AM

## 2017-08-30 LAB — GLUCOSE, CAPILLARY
GLUCOSE-CAPILLARY: 294 mg/dL — AB (ref 65–99)
GLUCOSE-CAPILLARY: 385 mg/dL — AB (ref 65–99)

## 2017-08-30 LAB — BASIC METABOLIC PANEL
Anion gap: 7 (ref 5–15)
BUN: 18 mg/dL (ref 6–20)
CO2: 23 mmol/L (ref 22–32)
Calcium: 7.9 mg/dL — ABNORMAL LOW (ref 8.9–10.3)
Chloride: 104 mmol/L (ref 101–111)
Creatinine, Ser: 0.65 mg/dL (ref 0.61–1.24)
GFR calc Af Amer: >60 mL/min (ref >60)
GFR calc non Af Amer: >60 mL/min (ref >60)
GLUCOSE: 319 mg/dL — AB (ref 65–99)
Potassium: 5.1 mmol/L (ref 3.5–5.1)
Sodium: 134 mmol/L — ABNORMAL LOW (ref 135–145)

## 2017-08-30 MED ORDER — INSULIN GLARGINE 100 UNIT/ML SOLOSTAR PEN: 25.0000 [IU] | PEN_INJECTOR | Freq: Every day | SUBCUTANEOUS | 0 refills | Status: DC

## 2017-08-30 MED ORDER — INSULIN PEN NEEDLE 32G X 8 MM MISC: 0 refills | Status: DC

## 2017-08-30 MED ORDER — METFORMIN HCL 500 MG PO TABS: 500.0000 mg | ORAL_TABLET | Freq: Two times a day (BID) | ORAL | 0 refills | Status: DC

## 2017-08-30 MED ORDER — INSULIN STARTER KIT- PEN NEEDLES (ENGLISH)
1.0000 | Freq: Once | Status: AC
  Administered 2017-08-30: 1
  Filled 2017-08-30: qty 1

## 2017-08-30 MED ORDER — FREESTYLE LANCETS MISC: 0 refills | Status: DC

## 2017-08-30 MED ORDER — GLUCOSE BLOOD VI STRP: ORAL_STRIP | 0 refills | Status: DC

## 2017-08-30 MED ORDER — FREESTYLE SYSTEM KIT: 1.0000 | PACK | Freq: Three times a day (TID) | 1 refills | Status: DC

## 2017-08-30 MED ORDER — DEXAMETHASONE 4 MG PO TABS: ORAL_TABLET | ORAL | 0 refills | Status: DC

## 2017-08-30 MED ORDER — INSULIN ASPART 100 UNIT/ML FLEXPEN: 6.0000 [IU] | PEN_INJECTOR | Freq: Three times a day (TID) | SUBCUTANEOUS | 0 refills | Status: DC

## 2017-08-30 MED FILL — NOVOLOG FLEXPEN SYRINGE: 100 | 33 days supply | Qty: 6 | Fill #0

## 2017-08-30 MED FILL — FREESTYLE LITE TEST STRIP: 30 days supply | Qty: 100 | Fill #0

## 2017-08-30 MED FILL — UNIFINE PENTIPS 8MM 31G: 31G X 8 MM | 30 days supply | Qty: 100 | Fill #0

## 2017-08-30 MED FILL — metFORMIN HCL 500 MG TABS: 500 | 30 days supply | Qty: 60 | Fill #0

## 2017-08-30 MED FILL — FREESTYLE LITE METER: 30 days supply | Qty: 1 | Fill #0

## 2017-08-30 MED FILL — LANTUS SOLOSTAR 100 UNITS/M: 100 | 34 days supply | Qty: 9 | Fill #0 | Status: TO

## 2017-08-30 NOTE — Progress Notes (Signed)
Inpatient Diabetes Program Recommendations  AACE/ADA: New Consensus Statement on Inpatient Glycemic Control (2015)  Target Ranges:  Prepandial:   less than 140 mg/dL      Peak postprandial:   less than 180 mg/dL (1-2 hours)      Critically ill patients:  140 - 180 mg/dL   Lab Results  Component Value Date   GLUCAP 294 (H) 08/30/2017   HGBA1C 10.3 (H) 08/29/2017    Review of Glycemic Control  Inpatient Diabetes Program Recommendations:  Reviewed insulin regimen with patient and wife @ bedside after discussing with Dr. Jerral Ralph. Reviewed hypoglycemia 15-15 rule again with patient and wife. Consulted Case manager regarding patient is currently in between insurance and COBRA. Gave patient copay cards for lantus and Novolog to be used after receives [facility] program for the first month. Patient plans to buy a Relion meter from [place] @ this time due to decreased cost. Reviewed importance of checking CBGs and followup with physician appts.  Thank you, Billy Fischer. Maycee Blasco, RN, MSN, CDE  Diabetes Coordinator Inpatient Glycemic Control Team Team Pager [phone] (8am-5pm) 08/30/2017 9:29 AM

## 2017-08-30 NOTE — Discharge Summary (Signed)
PATIENT DETAILS Name: Larry [name] Age: 56 y.o. Sex: male Date of Birth: Aug 27, 1961 MRN: [id number]. Admitting Physician: Vianne Bulls, MD JIR:[id number], No Pcp Per  Admit Date: 08/28/2017 Discharge date: 08/30/2017  Recommendations for Outpatient Follow-up:  1. Follow up with PCP in 1-2 weeks 2. Please obtain complete metabolic panel including LFTs-had mild elevation in LFTs of unknown etiology-see below 3. Has mild thrombocytopenia that appears to be chronic-defer further to the outpatient setting 4. Please ensure follow-up with neurosurgery   Admitted From:  Home  Disposition: [place]: No  Equipment/Devices: None  Discharge Condition: Stable  CODE STATUS: FULL CODE  Diet recommendation:  Heart Healthy / Carb Modified   Brief Summary: See H&P, Labs, Consult and Test reports for all details in brief, patient was admitted for evaluation of mild DKA. See below for further details.  Brief Hospital Course: DKA: Resolved-with initiation of IV insulin  DM-2: Probably had underlying diabetes-probably worsened due to addition of Decadron post cervical surgery. A1c is 10.3. Will increase Lantus to 25 units, and add 6 units of NovoLog with meals. We will also add metformin on discharge. Extensive diabetic education-including about appropriate interventions during hypoglycemia was discussed with both patient and spouse at bedside by myself and the diabetic coordinator. Patient asked to keep a record of both the medial and postnasal CBGs, and take it to their next primary care practitioner visit. Will need ongoing optimization of the insulin regimen-suspect that insulin requirement may come down somewhat once patient is off Decadron.  Elevated liver enzymes: Not sure what the exact etiology is-his AST is significantly elevated compared to his AST-is not having any abdominal pain, Limited RUQ ultrasound showed a fatty liver but without any acute abnormalities. Plans  are to follow-hopefully the enzymes were down trend further-if not-we can then consider further workup.  History of cervical stenosis with C5/C6 myelopathy-s/p C5/C6 cervical anterior discectomy on 07/30/2017: Per spouse-he is almost done with a month course of Decadron-we will start tapering. Suspect his sugars will further improve once he is off the Decadron. Per's patients spouse-patient continues to have tingling and numbness even after cervical spine surgery-he does have follow-up with neurosurgery/neurology-since these are chronic issues-we will defer further the outpatient setting. We will obtain PT evaluation prior to discharge.  Hyponatremia: Suspect this was pseudohyponatremia in a setting of uncontrolled diabetes-this has resolved. Follow electrolytes periodically.  Mild thrombocytopenia: Unsure what the etiology is-but no evidence of bleeding-appears very mild-should be stable for outpatient follow-up.   Procedures/Studies: None  Discharge Diagnoses:  Principal Problem:   DKA (diabetic ketoacidoses) (HCC) Active Problems:   Cervical stenosis of spinal canal   Hyponatremia   Elevated LFTs   Steroid-induced hyperglycemia   Thrombocytopenia (HCC)   Discharge Instructions:  Activity:  As tolerated   Discharge Instructions    Call MD for:  difficulty breathing, headache or visual disturbances    Complete by:  As directed    Call MD for:  persistant dizziness or light-headedness    Complete by:  As directed    Diet - low sodium heart healthy    Complete by:  As directed    Discharge instructions    Complete by:  As directed    Follow with Primary MD in 1 week  Please keep a log/record of your blood sugars-and please take it to your next visit to your PCPs office.  Please get a complete blood count and chemistry panel checked by your Primary MD at your  next visit, and again as instructed by your Primary MD.  Get Medicines reviewed and adjusted: Please take all  your medications with you for your next visit with your Primary MD  Laboratory/radiological data: Please request your Primary MD to go over all hospital tests and procedure/radiological results at the follow up, please ask your Primary MD to get all Hospital records sent to his/her office.  In some cases, they will be blood work, cultures and biopsy results pending at the time of your discharge. Please request that your primary care M.D. follows up on these results.  Also Note the following: If you experience worsening of your admission symptoms, develop shortness of breath, life threatening emergency, suicidal or homicidal thoughts you must seek medical attention immediately by calling 911 or calling your MD immediately  if symptoms less severe.  You must read complete instructions/literature along with all the possible adverse reactions/side effects for all the Medicines you take and that have been prescribed to you. Take any new Medicines after you have completely understood and accpet all the possible adverse reactions/side effects.   Do not drive when taking Pain medications or sleeping medications (Benzodaizepines)  Do not take more than prescribed Pain, Sleep and Anxiety Medications. It is not advisable to combine anxiety,sleep and pain medications without talking with your primary care practitioner  Special Instructions: If you have smoked or chewed Tobacco  in the last 2 yrs please stop smoking, stop any regular Alcohol  and or any Recreational drug use.  Wear Seat belts while driving.  Please note: You were cared for by a hospitalist during your hospital stay. Once you are discharged, your primary care physician will handle any further medical issues. Please note that NO REFILLS for any discharge medications will be authorized once you are discharged, as it is imperative that you return to your primary care physician (or establish a relationship with a primary care physician if you do  not have one) for your post hospital discharge needs so that they can reassess your need for medications and monitor your lab values.   Increase activity slowly    Complete by:  As directed      Allergies as of 08/30/2017      Reactions   Aspirin    Bleeding      Medication List    TAKE these medications   acetaminophen 500 MG tablet Commonly known as:  TYLENOL Take 500 mg by mouth daily.   dexamethasone 4 MG tablet Commonly known as:  DECADRON Take 4 mg BID for 2 days, then Take 4 mg daily for 2 days, then Take 2 mg (0.5 tablet) for 2 days and then stop What changed:  how much to take  how to take this  when to take this  additional instructions   DULoxetine 60 MG capsule Commonly known as:  CYMBALTA Take 60 mg by mouth daily.   famotidine 20 MG tablet Commonly known as:  PEPCID Take 20 mg by mouth 2 (two) times daily.   freestyle lancets Use as instructed   gabapentin 300 MG capsule Commonly known as:  NEURONTIN Take 300 mg by mouth 4 (four) times daily.   glucose blood test strip Commonly known as:  FREESTYLE TEST STRIPS Use as instructed   glucose monitoring kit monitoring kit 1 each by Does not apply route 4 (four) times daily - after meals and at bedtime. 1 month Diabetic Testing Supplies for QAC-QHS accuchecks.   insulin aspart 100 UNIT/ML FlexPen Commonly known  as:  NOVOLOG FLEXPEN Inject 6 Units into the skin 3 (three) times daily with meals.   Insulin Glargine 100 UNIT/ML Solostar Pen Commonly known as:  LANTUS Inject 25 Units into the skin daily at 10 pm.   Insulin Pen Needle 32G X 8 MM Misc Use as directed   meloxicam 15 MG tablet Commonly known as:  MOBIC Take 15 mg by mouth daily.   metFORMIN 500 MG tablet Commonly known as:  GLUCOPHAGE Take 1 tablet (500 mg total) by mouth 2 (two) times daily with a meal.   zolpidem 10 MG tablet Commonly known as:  AMBIEN Take 10 mg by mouth at bedtime as needed for sleep.              Discharge Care Instructions        Start     Ordered   08/30/17 0000  dexamethasone (DECADRON) 4 MG tablet     08/30/17 0935   08/30/17 0000  Insulin Glargine (LANTUS) 100 UNIT/ML Solostar Pen  Daily at 10 pm     08/30/17 0935   08/30/17 0000  glucose monitoring kit (FREESTYLE) monitoring kit  3 times daily after meals & bedtime     08/30/17 0935   08/30/17 0000  glucose blood (FREESTYLE TEST STRIPS) test strip     08/30/17 0935   08/30/17 0000  Lancets (FREESTYLE) lancets     08/30/17 0935   08/30/17 0000  insulin aspart (NOVOLOG FLEXPEN) 100 UNIT/ML FlexPen  3 times daily with meals     08/30/17 0935   08/30/17 0000  Insulin Pen Needle 32G X 8 MM MISC    Comments:  Ok to substitute brand   08/30/17 0935   08/30/17 0000  metFORMIN (GLUCOPHAGE) 500 MG tablet  2 times daily with meals     08/30/17 0935   08/30/17 0000  Increase activity slowly     08/30/17 0937   08/30/17 0000  Diet - low sodium heart healthy     08/30/17 1829   08/30/17 0000  Discharge instructions    Comments:  Follow with Primary MD in 1 week  Please keep a log/record of your blood sugars-and please take it to your next visit to your PCPs office.  Please get a complete blood count and chemistry panel checked by your Primary MD at your next visit, and again as instructed by your Primary MD.  Get Medicines reviewed and adjusted: Please take all your medications with you for your next visit with your Primary MD  Laboratory/radiological data: Please request your Primary MD to go over all hospital tests and procedure/radiological results at the follow up, please ask your Primary MD to get all Hospital records sent to his/her office.  In some cases, they will be blood work, cultures and biopsy results pending at the time of your discharge. Please request that your primary care M.D. follows up on these results.  Also Note the following: If you experience worsening of your admission symptoms, develop shortness  of breath, life threatening emergency, suicidal or homicidal thoughts you must seek medical attention immediately by calling 911 or calling your MD immediately  if symptoms less severe.  You must read complete instructions/literature along with all the possible adverse reactions/side effects for all the Medicines you take and that have been prescribed to you. Take any new Medicines after you have completely understood and accpet all the possible adverse reactions/side effects.   Do not drive when taking Pain medications or sleeping medications (Benzodaizepines)  Do not take more than prescribed Pain, Sleep and Anxiety Medications. It is not advisable to combine anxiety,sleep and pain medications without talking with your primary care practitioner  Special Instructions: If you have smoked or chewed Tobacco  in the last 2 yrs please stop smoking, stop any regular Alcohol  and or any Recreational drug use.  Wear Seat belts while driving.  Please note: You were cared for by a hospitalist during your hospital stay. Once you are discharged, your primary care physician will handle any further medical issues. Please note that NO REFILLS for any discharge medications will be authorized once you are discharged, as it is imperative that you return to your primary care physician (or establish a relationship with a primary care physician if you do not have one) for your post hospital discharge needs so that they can reassess your need for medications and monitor your lab values.   08/30/17 0109   08/30/17 0000  Call MD for:  persistant dizziness or light-headedness     08/30/17 0937   08/30/17 0000  Call MD for:  difficulty breathing, headache or visual disturbances     08/30/17 0937     Follow-up Information    Primary Care MD. Schedule an appointment as soon as possible for a visit in 1 day(s).          Allergies  Allergen Reactions  . Aspirin     Bleeding    Consultations:   None  Other  Procedures/Studies: Dg Chest Port 1 View  Result Date: 08/28/2017 CLINICAL DATA:  Hypoglycemia. Generalized "numbness" from the neck down. EXAM: PORTABLE CHEST 1 VIEW COMPARISON:  None. FINDINGS: Suboptimal inspiration accounts for mild atelectasis at the right lung base and accentuates the cardiac silhouette. Taking this into account, cardiac silhouette mildly to moderately enlarged. Hilar and mediastinal contours otherwise unremarkable. Lungs otherwise clear without airspace consolidation. Normal pulmonary vascularity. No visible pleural effusions. Degenerative changes in both shoulder joints with calcified loose bodies in the joints. IMPRESSION: 1. Suboptimal inspiration accounts for right basilar atelectasis. No acute cardiopulmonary disease otherwise. 2. Mild-to-moderate cardiomegaly without pulmonary edema. Electronically Signed   By: Evangeline Dakin M.D.   On: 08/28/2017 15:56   US Abdomen Limited Ruq  Result Date: 08/28/2017 CLINICAL DATA:  Elevated LFTs EXAM: ULTRASOUND ABDOMEN LIMITED RIGHT UPPER QUADRANT COMPARISON:  None. FINDINGS: Gallbladder: No gallstones or wall thickening visualized. No sonographic Murphy sign noted by sonographer. Common bile duct: Diameter: 3.8 mm. Liver: Diffuse increased echogenicity is noted consistent with fatty infiltration. No focal lesion is noted. Portal vein is patent on color Doppler imaging with normal direction of blood flow towards the liver. IMPRESSION: Fatty liver. No other focal abnormality is noted. Electronically Signed   By: Inez Catalina M.D.   On: 08/28/2017 20:13      TODAY-DAY OF DISCHARGE:  Subjective:   Antony [place] today has no headache,no chest abdominal pain,no new weakness tingling or numbness, feels much better wants to go home today.   Objective:   Blood pressure 123/71, pulse 78, temperature 98.3 F (36.8 C), temperature source Oral, resp. rate 18, height 5' 9"  (1.753 m), weight 97.8 kg (215 lb 9.8 oz), SpO2 97  %.  Intake/Output Summary (Last 24 hours) at 08/30/17 0937 Last data filed at 08/30/17 0856  Gross per 24 hour  Intake           661.76 ml  Output                1  ml  Net           660.76 ml   Filed Weights   08/28/17 1543 08/28/17 1907 08/28/17 2000  Weight: 108.9 kg (240 lb) 108.9 kg (240 lb) 97.8 kg (215 lb 9.8 oz)    Exam: Awake Alert, Oriented *3, No new F.N deficits, Normal affect [place].AT,PERRAL Supple Neck,No JVD, No cervical lymphadenopathy appriciated.  Symmetrical Chest wall movement, Good air movement bilaterally, CTAB RRR,No Gallops,Rubs or new Murmurs, No Parasternal Heave +ve B.Sounds, Abd Soft, Non tender, No organomegaly appriciated, No rebound -guarding or rigidity. No Cyanosis, Clubbing or edema, No new Rash or bruise   PERTINENT RADIOLOGIC STUDIES: Dg Chest Port 1 View  Result Date: 08/28/2017 CLINICAL DATA:  Hypoglycemia. Generalized "numbness" from the neck down. EXAM: PORTABLE CHEST 1 VIEW COMPARISON:  None. FINDINGS: Suboptimal inspiration accounts for mild atelectasis at the right lung base and accentuates the cardiac silhouette. Taking this into account, cardiac silhouette mildly to moderately enlarged. Hilar and mediastinal contours otherwise unremarkable. Lungs otherwise clear without airspace consolidation. Normal pulmonary vascularity. No visible pleural effusions. Degenerative changes in both shoulder joints with calcified loose bodies in the joints. IMPRESSION: 1. Suboptimal inspiration accounts for right basilar atelectasis. No acute cardiopulmonary disease otherwise. 2. Mild-to-moderate cardiomegaly without pulmonary edema. Electronically Signed   By: Evangeline Dakin M.D.   On: 08/28/2017 15:56   US Abdomen Limited Ruq  Result Date: 08/28/2017 CLINICAL DATA:  Elevated LFTs EXAM: ULTRASOUND ABDOMEN LIMITED RIGHT UPPER QUADRANT COMPARISON:  None. FINDINGS: Gallbladder: No gallstones or wall thickening visualized. No sonographic Murphy sign noted by  sonographer. Common bile duct: Diameter: 3.8 mm. Liver: Diffuse increased echogenicity is noted consistent with fatty infiltration. No focal lesion is noted. Portal vein is patent on color Doppler imaging with normal direction of blood flow towards the liver. IMPRESSION: Fatty liver. No other focal abnormality is noted. Electronically Signed   By: Inez Catalina M.D.   On: 08/28/2017 20:13     PERTINENT LAB RESULTS: CBC:  Recent Labs  08/28/17 1605  WBC 9.3  HGB 17.4*  HCT 50.0  PLT 109*   CMET CMP     Component Value Date/Time   NA 134 (L) 08/30/2017 0614   K 5.1 08/30/2017 0614   CL 104 08/30/2017 0614   CO2 23 08/30/2017 0614   GLUCOSE 319 (H) 08/30/2017 0614   BUN 18 08/30/2017 0614   CREATININE 0.65 08/30/2017 0614   CALCIUM 7.9 (L) 08/30/2017 0614   PROT 4.9 (L) 08/29/2017 0317   ALBUMIN 2.9 (L) 08/29/2017 0317   AST 45 (H) 08/29/2017 0317   ALT 192 (H) 08/29/2017 0317   ALKPHOS 95 08/29/2017 0317   BILITOT 0.9 08/29/2017 0317   GFRNONAA >60 08/30/2017 0614   GFRAA >60 08/30/2017 0614    GFR Estimated Creatinine Clearance: 120.3 mL/min (by C-G formula based on SCr of 0.65 mg/dL). No results for input(s): LIPASE, AMYLASE in the last 72 hours. No results for input(s): CKTOTAL, CKMB, CKMBINDEX, TROPONINI in the last 72 hours. Invalid input(s): POCBNP No results for input(s): DDIMER in the last 72 hours.  Recent Labs  08/29/17 0823  HGBA1C 10.3*   No results for input(s): CHOL, HDL, LDLCALC, TRIG, CHOLHDL, LDLDIRECT in the last 72 hours. No results for input(s): TSH, T4TOTAL, T3FREE, THYROIDAB in the last 72 hours.  Invalid input(s): FREET3 No results for input(s): VITAMINB12, FOLATE, FERRITIN, TIBC, IRON, RETICCTPCT in the last 72 hours. Coags: No results for input(s): INR in the last 72 hours.  Invalid  input(s): PT Microbiology: Recent Results (from the past 240 hour(s))  MRSA PCR Screening     Status: None   Collection Time: 08/28/17  9:00 PM  Result  Value Ref Range Status   MRSA by PCR NEGATIVE NEGATIVE Final    Comment:        The GeneXpert MRSA Assay (FDA approved for NASAL specimens only), is one component of a comprehensive MRSA colonization surveillance program. It is not intended to diagnose MRSA infection nor to guide or monitor treatment for MRSA infections.     FURTHER DISCHARGE INSTRUCTIONS:  Get Medicines reviewed and adjusted: Please take all your medications with you for your next visit with your Primary MD  Laboratory/radiological data: Please request your Primary MD to go over all hospital tests and procedure/radiological results at the follow up, please ask your Primary MD to get all Hospital records sent to his/her office.  In some cases, they will be blood work, cultures and biopsy results pending at the time of your discharge. Please request that your primary care M.D. goes through all the records of your hospital data and follows up on these results.  Also Note the following: If you experience worsening of your admission symptoms, develop shortness of breath, life threatening emergency, suicidal or homicidal thoughts you must seek medical attention immediately by calling 911 or calling your MD immediately  if symptoms less severe.  You must read complete instructions/literature along with all the possible adverse reactions/side effects for all the Medicines you take and that have been prescribed to you. Take any new Medicines after you have completely understood and accpet all the possible adverse reactions/side effects.   Do not drive when taking Pain medications or sleeping medications (Benzodaizepines)  Do not take more than prescribed Pain, Sleep and Anxiety Medications. It is not advisable to combine anxiety,sleep and pain medications without talking with your primary care practitioner  Special Instructions: If you have smoked or chewed Tobacco  in the last 2 yrs please stop smoking, stop any regular  Alcohol  and or any Recreational drug use.  Wear Seat belts while driving.  Please note: You were cared for by a hospitalist during your hospital stay. Once you are discharged, your primary care physician will handle any further medical issues. Please note that NO REFILLS for any discharge medications will be authorized once you are discharged, as it is imperative that you return to your primary care physician (or establish a relationship with a primary care physician if you do not have one) for your post hospital discharge needs so that they can reassess your need for medications and monitor your lab values.  Total Time spent coordinating discharge including counseling, education and face to face time equals 45 minutes.  SignedOren Binet 08/30/2017 9:37 AM

## 2017-08-30 NOTE — Plan of Care (Signed)
Problem: Food- and Nutrition-Related Knowledge Deficit (NB-1.1) Goal: Nutrition education Formal process to instruct or train a patient/client in a skill or to impart knowledge to help patients/clients voluntarily manage or modify food choices and eating behavior to maintain or improve health. Outcome: Completed/Met Date Met: 08/30/17  RD consulted for nutrition education regarding diabetes.   Lab Results  Component Value Date   HGBA1C 10.3 (H) 08/29/2017    RD provided "Carbohydrate Counting for People with Diabetes" handout from the Academy of [employer]etics. Discussed different food groups and their effects on blood sugar, emphasizing carbohydrate-containing foods. Provided list of carbohydrates and recommended serving sizes of common foods.  Discussed importance of controlled and consistent carbohydrate intake throughout the day. Provided examples of ways to balance meals/snacks and encouraged intake of high-fiber, whole grain complex carbohydrates. Teach back method used.  Expect good compliance. Pt 's wife present during education, she cooks at home.   Body mass index is 31.84 kg/m. Pt meets criteria for obesity based on current BMI.  Current diet order is CHO modified, patient is consuming approximately 100% of meals at this time. Labs and medications reviewed. No further nutrition interventions warranted at this time. If additional nutrition issues arise, please re-consult RD.  Clayton Bibles, MS, RD, LDN Pager: [phone] After Hours Pager: [phone]

## 2017-08-30 NOTE — Evaluation (Signed)
Physical Therapy Evaluation Patient Details Name: Larry [name] MRN: [id number] DOB: 12-Feb-1961 Today's Date: 08/30/2017   History of Present Illness  Pt is a 56 year old male admitted for DKA with history of cervical stenosis with C5/C6 myelopathy-s/p C5/C6 cervical anterior discectomy on 07/30/2017  Clinical Impression  Patient evaluated by Physical Therapy with no further acute PT needs identified. All education has been completed and the patient has no further questions.  Pt mobilizing well.  Pt reports numbness/tingling from neck and below after recent cervical neck surgery and using SPC as needed.  Pt denies any increased weakness.  Pt educated to check feet and wear closed toe shoes with decreased sensation in feet.  Pt agreeable to no further needs and anticipates d/c home today. PT is signing off. Thank you for this referral.     Follow Up Recommendations No PT follow up    Equipment Recommendations  None recommended by PT    Recommendations for Other Services       Precautions / Restrictions Precautions Precaution Comments: pt reports no brace s/p cervical neck surgery      Mobility  Bed Mobility Overal bed mobility: Modified Independent                Transfers Overall transfer level: Modified independent                  Ambulation/Gait Ambulation/Gait assistance: Supervision;Modified independent (Device/Increase time) Ambulation Distance (Feet): 400 Feet Assistive device: None Gait Pattern/deviations: Step-through pattern     General Gait Details: increased toe out however no other obvious deviations, steady, no LOB, slow pace  Stairs            Wheelchair Mobility    Modified Rankin (Stroke Patients Only)       Balance Overall balance assessment: No apparent balance deficits (not formally assessed) (denies any falls)                                           Pertinent Vitals/Pain Pain Assessment: No/denies  pain    Home Living Family/patient expects to be discharged to:: Private residence Living Arrangements: Spouse/significant other   Type of Home: House         Home Equipment: Gilmer Mor - single point      Prior Function Level of Independence: Independent with assistive device(s)         Comments: uses SPC as needed      Hand Dominance        Extremity/Trunk Assessment        Lower Extremity Assessment Lower Extremity Assessment: Overall WFL for tasks assessed (reports numbness/tingling from neck and below after cervical neck surgery)       Communication   Communication: No difficulties  Cognition Arousal/Alertness: Awake/alert Behavior During Therapy: WFL for tasks assessed/performed Overall Cognitive Status: Within Functional Limits for tasks assessed                                        General Comments      Exercises     Assessment/Plan    PT Assessment Patent does not need any further PT services  PT Problem List         PT Treatment Interventions      PT Goals (Current goals  can be found in the Care Plan section)  Acute Rehab PT Goals PT Goal Formulation: All assessment and education complete, DC therapy    Frequency     Barriers to discharge        Co-evaluation               AM-PAC PT "6 Clicks" Daily Activity  Outcome Measure Difficulty turning over in bed (including adjusting bedclothes, sheets and blankets)?: None Difficulty moving from lying on back to sitting on the side of the bed? : None Difficulty sitting down on and standing up from a chair with arms (e.g., wheelchair, bedside commode, etc,.)?: None Help needed moving to and from a bed to chair (including a wheelchair)?: None Help needed walking in hospital room?: None Help needed climbing 3-5 steps with a railing? : None 6 Click Score: 24    End of Session   Activity Tolerance: Patient tolerated treatment well Patient left: in bed;with call  bell/phone within reach;with family/visitor present Nurse Communication: Mobility status PT Visit Diagnosis: Difficulty in walking, not elsewhere classified (R26.2)    Time: 1499-6924 PT Time Calculation (min) (ACUTE ONLY): 14 min   Charges:   PT Evaluation $PT Eval Low Complexity: 1 Low     PT G CodesZenovia Jarred, PT, DPT 08/30/2017 Pager: [phone]  Maida Sale E 08/30/2017, 10:31 AM

## 2017-08-30 NOTE — Care Management Note (Addendum)
Case Management Note  Patient Details  Name: Larry [name] MRN: [id number] Date of Birth: Feb 13, 1961  Subjective/Objective:                  See below  Action/Plan: Date:  August 30, 2017 Chart reviewed for concurrent status and case management needs. Will continue to follow patient progress. Discharge Planning: [facility] program done for Novalog and Lantus Expected discharge date: [id number] Marcelle Smiling, BSN, [place], [place]   [phone]  Expected Discharge Date:  08/30/2017               Expected Discharge Plan:  Home/Self Care  In-House Referral:     Discharge planning Services  CM Consult  Post Acute Care Choice:    Choice offered to:     DME Arranged:    DME Agency:     HH Arranged:    HH Agency:     Status of Service:  In process, will continue to follow  If discussed at Long Length of Stay Meetings, dates discussed:    Additional Comments:  Golda Acre, RN 08/30/2017, 9:02 AM

## 2020-09-11 ENCOUNTER — Other Ambulatory Visit: Payer: Self-pay

## 2020-09-11 ENCOUNTER — Inpatient Hospital Stay (HOSPITAL_COMMUNITY)
Admission: EM | Admit: 2020-09-11 | Discharge: 2020-09-14 | DRG: 202 | Disposition: A | Discharge status: Home / Self Care | Payer: Medicare HMO | Attending: Internal Medicine | Admitting: Internal Medicine

## 2020-09-11 DIAGNOSIS — B338 Other specified viral diseases: Secondary | ICD-10-CM | POA: Diagnosis present

## 2020-09-11 DIAGNOSIS — I110 Hypertensive heart disease with heart failure: Secondary | ICD-10-CM | POA: Diagnosis present

## 2020-09-11 DIAGNOSIS — E669 Obesity, unspecified: Secondary | ICD-10-CM | POA: Diagnosis present

## 2020-09-11 DIAGNOSIS — D696 Thrombocytopenia, unspecified: Secondary | ICD-10-CM | POA: Diagnosis present

## 2020-09-11 DIAGNOSIS — Z8249 Family history of ischemic heart disease and other diseases of the circulatory system: Secondary | ICD-10-CM

## 2020-09-11 DIAGNOSIS — J205 Acute bronchitis due to respiratory syncytial virus: Secondary | ICD-10-CM | POA: Diagnosis not present

## 2020-09-11 DIAGNOSIS — K219 Gastro-esophageal reflux disease without esophagitis: Secondary | ICD-10-CM | POA: Diagnosis present

## 2020-09-11 DIAGNOSIS — R0602 Shortness of breath: Secondary | ICD-10-CM | POA: Diagnosis not present

## 2020-09-11 DIAGNOSIS — F17210 Nicotine dependence, cigarettes, uncomplicated: Secondary | ICD-10-CM | POA: Diagnosis present

## 2020-09-11 DIAGNOSIS — J9601 Acute respiratory failure with hypoxia: Secondary | ICD-10-CM

## 2020-09-11 DIAGNOSIS — G35 Multiple sclerosis: Secondary | ICD-10-CM | POA: Diagnosis present

## 2020-09-11 DIAGNOSIS — E118 Type 2 diabetes mellitus with unspecified complications: Secondary | ICD-10-CM | POA: Diagnosis present

## 2020-09-11 DIAGNOSIS — R55 Syncope and collapse: Secondary | ICD-10-CM | POA: Diagnosis present

## 2020-09-11 DIAGNOSIS — R7989 Other specified abnormal findings of blood chemistry: Secondary | ICD-10-CM | POA: Diagnosis present

## 2020-09-11 DIAGNOSIS — J441 Chronic obstructive pulmonary disease with (acute) exacerbation: Secondary | ICD-10-CM

## 2020-09-11 DIAGNOSIS — Z79890 Hormone replacement therapy (postmenopausal): Secondary | ICD-10-CM

## 2020-09-11 DIAGNOSIS — Z6831 Body mass index (BMI) 31.0-31.9, adult: Secondary | ICD-10-CM

## 2020-09-11 DIAGNOSIS — G629 Polyneuropathy, unspecified: Secondary | ICD-10-CM

## 2020-09-11 DIAGNOSIS — Z20822 Contact with and (suspected) exposure to covid-19: Secondary | ICD-10-CM | POA: Diagnosis present

## 2020-09-11 DIAGNOSIS — M1990 Unspecified osteoarthritis, unspecified site: Secondary | ICD-10-CM | POA: Diagnosis present

## 2020-09-11 DIAGNOSIS — I5021 Acute systolic (congestive) heart failure: Secondary | ICD-10-CM | POA: Diagnosis present

## 2020-09-11 DIAGNOSIS — Z886 Allergy status to analgesic agent status: Secondary | ICD-10-CM

## 2020-09-11 DIAGNOSIS — I429 Cardiomyopathy, unspecified: Secondary | ICD-10-CM

## 2020-09-11 DIAGNOSIS — E1142 Type 2 diabetes mellitus with diabetic polyneuropathy: Secondary | ICD-10-CM | POA: Diagnosis present

## 2020-09-11 DIAGNOSIS — G4733 Obstructive sleep apnea (adult) (pediatric): Secondary | ICD-10-CM | POA: Diagnosis present

## 2020-09-11 DIAGNOSIS — F329 Major depressive disorder, single episode, unspecified: Secondary | ICD-10-CM | POA: Diagnosis present

## 2020-09-11 DIAGNOSIS — Z79899 Other long term (current) drug therapy: Secondary | ICD-10-CM

## 2020-09-11 DIAGNOSIS — E782 Mixed hyperlipidemia: Secondary | ICD-10-CM | POA: Diagnosis present

## 2020-09-11 DIAGNOSIS — Z720 Tobacco use: Secondary | ICD-10-CM | POA: Diagnosis present

## 2020-09-11 NOTE — ED Triage Notes (Signed)
[facility]. His main complaint is SOB. No known exposure to Covid but has had RSV exposure. No known hx of COPD. Pt was 88% on CPAP at home. He is currently saturating 97% on NRB at 10L. He is febrile with temp 100.65f. He had duonebs, 125 of solu medrol, and 2g of mag via EMS

## 2020-09-12 ENCOUNTER — Observation Stay (HOSPITAL_COMMUNITY): Payer: Medicare HMO

## 2020-09-12 ENCOUNTER — Encounter (HOSPITAL_COMMUNITY): Payer: Self-pay | Admitting: Internal Medicine

## 2020-09-12 ENCOUNTER — Emergency Department (HOSPITAL_COMMUNITY): Payer: Medicare HMO

## 2020-09-12 DIAGNOSIS — G35 Multiple sclerosis: Secondary | ICD-10-CM

## 2020-09-12 DIAGNOSIS — R7989 Other specified abnormal findings of blood chemistry: Secondary | ICD-10-CM | POA: Diagnosis present

## 2020-09-12 DIAGNOSIS — E782 Mixed hyperlipidemia: Secondary | ICD-10-CM | POA: Diagnosis not present

## 2020-09-12 DIAGNOSIS — K219 Gastro-esophageal reflux disease without esophagitis: Secondary | ICD-10-CM

## 2020-09-12 DIAGNOSIS — Z720 Tobacco use: Secondary | ICD-10-CM | POA: Diagnosis present

## 2020-09-12 DIAGNOSIS — R55 Syncope and collapse: Secondary | ICD-10-CM | POA: Diagnosis present

## 2020-09-12 DIAGNOSIS — F329 Major depressive disorder, single episode, unspecified: Secondary | ICD-10-CM

## 2020-09-12 DIAGNOSIS — J9601 Acute respiratory failure with hypoxia: Secondary | ICD-10-CM | POA: Diagnosis present

## 2020-09-12 DIAGNOSIS — I429 Cardiomyopathy, unspecified: Secondary | ICD-10-CM

## 2020-09-12 DIAGNOSIS — I5030 Unspecified diastolic (congestive) heart failure: Secondary | ICD-10-CM | POA: Diagnosis not present

## 2020-09-12 DIAGNOSIS — E118 Type 2 diabetes mellitus with unspecified complications: Secondary | ICD-10-CM | POA: Diagnosis present

## 2020-09-12 DIAGNOSIS — E1142 Type 2 diabetes mellitus with diabetic polyneuropathy: Secondary | ICD-10-CM | POA: Diagnosis present

## 2020-09-12 DIAGNOSIS — G629 Polyneuropathy, unspecified: Secondary | ICD-10-CM

## 2020-09-12 DIAGNOSIS — B338 Other specified viral diseases: Secondary | ICD-10-CM | POA: Diagnosis present

## 2020-09-12 DIAGNOSIS — G4733 Obstructive sleep apnea (adult) (pediatric): Secondary | ICD-10-CM | POA: Diagnosis present

## 2020-09-12 HISTORY — DX: Multiple sclerosis: G35

## 2020-09-12 HISTORY — DX: Gastro-esophageal reflux disease without esophagitis: K21.9

## 2020-09-12 HISTORY — DX: Mixed hyperlipidemia: E78.2

## 2020-09-12 HISTORY — DX: Major depressive disorder, single episode, unspecified: F32.9

## 2020-09-12 LAB — RESP PANEL BY RT PCR (RSV, FLU A&B, COVID)
Influenza A by PCR: NEGATIVE
Influenza B by PCR: NEGATIVE
Respiratory Syncytial Virus by PCR: POSITIVE — AB
SARS Coronavirus 2 by RT PCR: NEGATIVE

## 2020-09-12 LAB — CBC WITH DIFFERENTIAL/PLATELET
Abs Immature Granulocytes: 0.11 10*3/uL — ABNORMAL HIGH (ref 0.00–0.07)
Basophils Absolute: 0 10*3/uL (ref 0.0–0.1)
Basophils Relative: 0 %
Eosinophils Absolute: 0 10*3/uL (ref 0.0–0.5)
Eosinophils Relative: 0 %
HCT: 50.1 % (ref 39.0–52.0)
Hemoglobin: 16.5 g/dL (ref 13.0–17.0)
Immature Granulocytes: 1 %
Lymphocytes Relative: 9 %
Lymphs Abs: 0.8 10*3/uL (ref 0.7–4.0)
MCH: 29.7 pg (ref 26.0–34.0)
MCHC: 32.9 g/dL (ref 30.0–36.0)
MCV: 90.3 fL (ref 80.0–100.0)
Monocytes Absolute: 0.7 10*3/uL (ref 0.1–1.0)
Monocytes Relative: 8 %
Neutro Abs: 7 10*3/uL (ref 1.7–7.7)
Neutrophils Relative %: 82 %
Platelets: 109 10*3/uL — ABNORMAL LOW (ref 150–400)
RBC: 5.55 MIL/uL (ref 4.22–5.81)
RDW: 14.2 % (ref 11.5–15.5)
WBC: 8.5 10*3/uL (ref 4.0–10.5)
nRBC: 0 % (ref 0.0–0.2)

## 2020-09-12 LAB — GLUCOSE, CAPILLARY
Glucose-Capillary: 272 mg/dL — ABNORMAL HIGH (ref 70–99)
Glucose-Capillary: 302 mg/dL — ABNORMAL HIGH (ref 70–99)
Glucose-Capillary: 181 mg/dL — ABNORMAL HIGH (ref 70–99)
Glucose-Capillary: 252 mg/dL — ABNORMAL HIGH (ref 70–99)

## 2020-09-12 LAB — HEMOGLOBIN A1C
Hgb A1c MFr Bld: 6.1 % — ABNORMAL HIGH (ref 4.8–5.6)
Mean Plasma Glucose: 128.37 mg/dL

## 2020-09-12 LAB — BLOOD GAS, ARTERIAL
Acid-base deficit: 2.7 mmol/L — ABNORMAL HIGH (ref 0.0–2.0)
Bicarbonate: 22 mmol/L (ref 20.0–28.0)
Drawn by: 11249
O2 Saturation: 96.1 %
Patient temperature: 99.5
pCO2 arterial: 41 mmHg (ref 32.0–48.0)
pH, Arterial: 7.352 (ref 7.350–7.450)
pO2, Arterial: 93.2 mmHg (ref 83.0–108.0)

## 2020-09-12 LAB — BASIC METABOLIC PANEL
Anion gap: 10 (ref 5–15)
BUN: 8 mg/dL (ref 6–20)
CO2: 24 mmol/L (ref 22–32)
Calcium: 8.2 mg/dL — ABNORMAL LOW (ref 8.9–10.3)
Chloride: 102 mmol/L (ref 98–111)
Creatinine, Ser: 1.09 mg/dL (ref 0.61–1.24)
GFR calc Af Amer: >60 mL/min (ref >60)
GFR calc non Af Amer: >60 mL/min (ref >60)
Glucose, Bld: 155 mg/dL — ABNORMAL HIGH (ref 70–99)
Potassium: 3.8 mmol/L (ref 3.5–5.1)
Sodium: 136 mmol/L (ref 135–145)

## 2020-09-12 LAB — HIV ANTIBODY (ROUTINE TESTING W REFLEX): HIV Screen 4th Generation wRfx: NONREACTIVE

## 2020-09-12 LAB — ECHOCARDIOGRAM COMPLETE
Area-P 1/2: 3.21 cm2
S' Lateral: 4.9 cm

## 2020-09-12 LAB — TROPONIN I (HIGH SENSITIVITY): Troponin I (High Sensitivity): 4 ng/L (ref <18)

## 2020-09-12 LAB — MRSA PCR SCREENING: MRSA by PCR: NEGATIVE

## 2020-09-12 LAB — D-DIMER, QUANTITATIVE: D-Dimer, Quant: 0.88 ug/mL-FEU — ABNORMAL HIGH (ref 0.00–0.50)

## 2020-09-12 LAB — BRAIN NATRIURETIC PEPTIDE: B Natriuretic Peptide: 370 pg/mL — ABNORMAL HIGH (ref 0.0–100.0)

## 2020-09-12 MED ORDER — DULOXETINE HCL 30 MG PO CPEP
60.0000 mg | ORAL_CAPSULE | Freq: Every day | ORAL | Status: DC
  Administered 2020-09-12 – 2020-09-14 (×3): 60 mg via ORAL
  Filled 2020-09-12 (×3): qty 2

## 2020-09-12 MED ORDER — PERFLUTREN LIPID MICROSPHERE
1.0000 mL | INTRAVENOUS | Status: AC | PRN
  Administered 2020-09-12: 2 mL via INTRAVENOUS
  Filled 2020-09-12: qty 10

## 2020-09-12 MED ORDER — ALBUTEROL SULFATE HFA 108 (90 BASE) MCG/ACT IN AERS
2.0000 | INHALATION_SPRAY | RESPIRATORY_TRACT | Status: DC | PRN
  Administered 2020-09-12 – 2020-09-13 (×2): 2 via RESPIRATORY_TRACT
  Filled 2020-09-12: qty 6.7

## 2020-09-12 MED ORDER — ONDANSETRON HCL 4 MG PO TABS: 4.0000 mg | ORAL_TABLET | Freq: Four times a day (QID) | ORAL | Status: DC | PRN

## 2020-09-12 MED ORDER — ALBUTEROL SULFATE HFA 108 (90 BASE) MCG/ACT IN AERS
6.0000 | INHALATION_SPRAY | Freq: Once | RESPIRATORY_TRACT | Status: AC
  Administered 2020-09-12: 6 via RESPIRATORY_TRACT
  Filled 2020-09-12: qty 6.7

## 2020-09-12 MED ORDER — ATORVASTATIN CALCIUM 10 MG PO TABS
10.0000 mg | ORAL_TABLET | Freq: Every day | ORAL | Status: DC
  Administered 2020-09-12 – 2020-09-14 (×3): 10 mg via ORAL
  Filled 2020-09-12 (×3): qty 1

## 2020-09-12 MED ORDER — ZOLPIDEM TARTRATE 5 MG PO TABS
10.0000 mg | ORAL_TABLET | Freq: Every evening | ORAL | Status: DC | PRN
  Administered 2020-09-12 – 2020-09-13 (×2): 10 mg via ORAL
  Filled 2020-09-12 (×2): qty 2

## 2020-09-12 MED ORDER — DIROXIMEL FUMARATE 231 MG PO CPDR
462.0000 mg | DELAYED_RELEASE_CAPSULE | Freq: Every day | ORAL | Status: DC
  Administered 2020-09-12: 462 mg via ORAL

## 2020-09-12 MED ORDER — POLYETHYLENE GLYCOL 3350 17 G PO PACK: 17.0000 g | PACK | Freq: Every day | ORAL | Status: DC | PRN

## 2020-09-12 MED ORDER — ONDANSETRON HCL 4 MG/2ML IJ SOLN: 4.0000 mg | Freq: Four times a day (QID) | INTRAMUSCULAR | Status: DC | PRN

## 2020-09-12 MED ORDER — DEXAMETHASONE SODIUM PHOSPHATE 10 MG/ML IJ SOLN
10.0000 mg | Freq: Once | INTRAMUSCULAR | Status: AC
Start: 2020-09-12 — End: 2020-09-12
  Administered 2020-09-12: 10 mg via INTRAVENOUS
  Filled 2020-09-12: qty 1

## 2020-09-12 MED ORDER — ORAL CARE MOUTH RINSE
15.0000 mL | Freq: Two times a day (BID) | OROMUCOSAL | Status: DC
  Administered 2020-09-12 – 2020-09-14 (×5): 15 mL via OROMUCOSAL

## 2020-09-12 MED ORDER — FUROSEMIDE 10 MG/ML IJ SOLN
40.0000 mg | Freq: Once | INTRAMUSCULAR | Status: AC
  Administered 2020-09-12: 40 mg via INTRAVENOUS
  Filled 2020-09-12: qty 4

## 2020-09-12 MED ORDER — PANTOPRAZOLE SODIUM 40 MG PO TBEC
40.0000 mg | DELAYED_RELEASE_TABLET | Freq: Every day | ORAL | Status: DC
  Administered 2020-09-12 – 2020-09-14 (×3): 40 mg via ORAL
  Filled 2020-09-12 (×3): qty 1

## 2020-09-12 MED ORDER — INSULIN ASPART 100 UNIT/ML ~~LOC~~ SOLN
0.0000 [IU] | Freq: Three times a day (TID) | SUBCUTANEOUS | Status: DC
  Administered 2020-09-12: 11 [IU] via SUBCUTANEOUS
  Administered 2020-09-12: 8 [IU] via SUBCUTANEOUS
  Administered 2020-09-12: 3 [IU] via SUBCUTANEOUS
  Administered 2020-09-12: 8 [IU] via SUBCUTANEOUS
  Administered 2020-09-13: 3 [IU] via SUBCUTANEOUS
  Administered 2020-09-13: 2 [IU] via SUBCUTANEOUS
  Administered 2020-09-13: 5 [IU] via SUBCUTANEOUS
  Administered 2020-09-13: 3 [IU] via SUBCUTANEOUS
  Administered 2020-09-14: 5 [IU] via SUBCUTANEOUS
  Administered 2020-09-14: 2 [IU] via SUBCUTANEOUS
  Filled 2020-09-12: qty 0.15

## 2020-09-12 MED ORDER — ACETAMINOPHEN 650 MG RE SUPP: 650.0000 mg | Freq: Four times a day (QID) | RECTAL | Status: DC | PRN

## 2020-09-12 MED ORDER — CHLORHEXIDINE GLUCONATE CLOTH 2 % EX PADS
6.0000 | MEDICATED_PAD | Freq: Every day | CUTANEOUS | Status: DC
  Administered 2020-09-12 – 2020-09-13 (×2): 6 via TOPICAL

## 2020-09-12 MED ORDER — BUPROPION HCL ER (XL) 150 MG PO TB24
150.0000 mg | ORAL_TABLET | Freq: Every day | ORAL | Status: DC
  Administered 2020-09-12 – 2020-09-14 (×3): 150 mg via ORAL
  Filled 2020-09-12 (×3): qty 1

## 2020-09-12 MED ORDER — METHYLPREDNISOLONE SODIUM SUCC 40 MG IJ SOLR
40.0000 mg | Freq: Two times a day (BID) | INTRAMUSCULAR | Status: DC
  Administered 2020-09-12 – 2020-09-13 (×3): 40 mg via INTRAVENOUS
  Filled 2020-09-12 (×3): qty 1

## 2020-09-12 MED ORDER — PREGABALIN 50 MG PO CAPS: 150.0000 mg | ORAL_CAPSULE | Freq: Three times a day (TID) | ORAL | Status: DC

## 2020-09-12 MED ORDER — LEVOTHYROXINE SODIUM 50 MCG PO TABS
50.0000 ug | ORAL_TABLET | Freq: Every day | ORAL | Status: DC
  Administered 2020-09-12 – 2020-09-14 (×3): 50 ug via ORAL
  Filled 2020-09-12 (×2): qty 1

## 2020-09-12 MED ORDER — ORAL CARE MOUTH RINSE: 15.0000 mL | Freq: Two times a day (BID) | OROMUCOSAL | Status: DC

## 2020-09-12 MED ORDER — PREGABALIN 75 MG PO CAPS
150.0000 mg | ORAL_CAPSULE | Freq: Three times a day (TID) | ORAL | Status: DC
  Administered 2020-09-12 – 2020-09-14 (×7): 150 mg via ORAL
  Filled 2020-09-12 (×7): qty 2

## 2020-09-12 MED ORDER — MIDODRINE HCL 5 MG PO TABS
5.0000 mg | ORAL_TABLET | Freq: Every day | ORAL | Status: DC
  Administered 2020-09-12 – 2020-09-14 (×3): 5 mg via ORAL
  Filled 2020-09-12 (×4): qty 1

## 2020-09-12 MED ORDER — METHOCARBAMOL 500 MG PO TABS: 750.0000 mg | ORAL_TABLET | Freq: Three times a day (TID) | ORAL | Status: DC | PRN

## 2020-09-12 MED ORDER — ACETAMINOPHEN 325 MG PO TABS: 650.0000 mg | ORAL_TABLET | Freq: Four times a day (QID) | ORAL | Status: DC | PRN

## 2020-09-12 MED ORDER — ENOXAPARIN SODIUM 40 MG/0.4ML ~~LOC~~ SOLN
40.0000 mg | SUBCUTANEOUS | Status: DC
  Administered 2020-09-12 – 2020-09-14 (×3): 40 mg via SUBCUTANEOUS
  Filled 2020-09-12 (×3): qty 0.4

## 2020-09-12 NOTE — Assessment & Plan Note (Signed)
   Continue home statin therapy.

## 2020-09-12 NOTE — Assessment & Plan Note (Signed)
   May have contracted from multiple family members who have RSV.  Supportive care.

## 2020-09-12 NOTE — Assessment & Plan Note (Signed)
   Reportedly occurred 4 to 5 days [place].  This was reported by patient's daughter who indicated that after he drove back from the mountains, he has no recollection of how he got back home.  Monitor on telemetry for arrhythmias.  Check TTE.  Patient should not drive for 6 months.

## 2020-09-12 NOTE — Assessment & Plan Note (Signed)
   Patient was diagnosed and hospitalized with diabetes mellitus type 2 with diabetic ketoacidosis in 2018  Patient states that he no longer has diabetes and that he is no longer on therapy.  Since he was initiated the patient on steroid therapy, monitor CBGs and placed on SSI  Follow hemoglobin A1c

## 2020-09-12 NOTE — Hospital Course (Addendum)
59 year old male with PMH of type II DM, no longer on treatment, OSA on nightly CPAP, GERD, peripheral neuropathy, depression, hyperlipidemia, multiple sclerosis, orthostatic hypotension on midodrine tobacco abuse, recent exposure to multiple family members with RSV infection, presented to the ED via EMS due to mostly dry cough, worsening dyspnea and wheezing of 4 days duration.  EMS found him with acute hypoxic respiratory failure while on home CPAP.  He was transitioned to NRB mask and transported to ED.  S/p BiPAP for several hours, IV Lasix, IV Solu-Medrol.  Improved.  Weaned off oxygen during the course of the day 9/27, likely due to desaturation evaluation.    Nasal swab was positive for RSV well with the patient. He was treated supportively. He ambulated prior to discharge and did require O2 at time of discharge which was arranged.   Cardiology consulted, initiating new ARB and recommended monitoring overnight for orthostatic changes given prior history of same for which he is on midodrine as well. He will follow-up further with cardiology outpatient for ongoing CAD work-up.

## 2020-09-12 NOTE — Assessment & Plan Note (Signed)
   Continue home regimen of Vumerity  Follows with neurology at [facility]

## 2020-09-12 NOTE — ED Notes (Signed)
Attempted to call report. No response

## 2020-09-12 NOTE — ED Notes (Signed)
Echo @ bedside

## 2020-09-12 NOTE — ED Notes (Signed)
Off of bipap on 6L HFNC. Will continue to monitor to see how pt tolerates

## 2020-09-12 NOTE — Assessment & Plan Note (Signed)
   As per daughter who is nursing tech at the [facility] ED, home oxygen saturation was 87% on CPAP prior to EMS arrival.  On initial arrival to the ED he was noted to have significant expiratory wheezing with multiple family members noted to be positive for RSV in the past several days.  Patient has tested positive for RSV upon arrival here at [facility] emergency department. Patient is Covid negative.  Chest x-ray does not reveal any definitive infiltrate  D-dimer is only minimally elevated and patient is less likely to be suffering from an acute pulmonary embolism.  No history of recent long distance travel.  Most likely, patient is suffering from an RSV acute bronchitis in a patient with multiple sclerosis and immunocompromise. Alternatively, patient may be suffering from a COPD exacerbation considering his longstanding history from smoking -patient does not carry a diagnosis of COPD.  Although BNP is elevated, low index of suspicion for decompensated CHF.  Continue IV Solu-Medrol, bronchodilator nebulizations, aggressive pulmonary toilet and supportive care.  Discontinued Lasix.  Continue monitoring in stepdown unit overnight.  Was on BiPAP since arrival in the ED until approximately 8 AM today.  BiPAP as needed.  Wean oxygen as needed for oxygen saturations >92%.  Continue prior home CPAP at bedtime.

## 2020-09-12 NOTE — Progress Notes (Signed)
  Echocardiogram 2D Echocardiogram has been performed.  Larry [name] 09/12/2020, 8:46 AM

## 2020-09-12 NOTE — ED Notes (Signed)
Respiratory called to bring bipap machine up to 2W with him

## 2020-09-12 NOTE — Progress Notes (Signed)
Rt took pt off BIPAP and placed pt on 6LPM HFNC. No distress noted at this time.

## 2020-09-12 NOTE — Progress Notes (Signed)
PROGRESS NOTE   Larry [name]  WRU:[id number]    DOB: 1961/01/13    DOA: 09/11/2020  PCP: Roberts Gaudy., MD   I have briefly reviewed patients previous medical records in [facility].  Chief Complaint  Patient presents with  . Shortness of Breath    Brief Narrative:  59 year old male with PMH of type II DM, no longer on treatment, OSA on nightly CPAP, GERD, peripheral neuropathy, depression, hyperlipidemia, multiple sclerosis, tobacco abuse, recent exposure to multiple family members with RSV infection, presented to the ED via EMS due to mostly dry cough, worsening dyspnea and wheezing of 4 days duration.  EMS found him with acute hypoxic respiratory failure while on home CPAP.  He was transitioned to NRB mask and transported to ED.  S/p BiPAP for several hours, IV Lasix, IV Solu-Medrol.  Improved, trying to wean off BiPAP in ED.    Assessment & Plan:  Acute respiratory failure with hypoxia (HCC)  As per daughter who is nursing tech at the [facility] ED, home oxygen saturation was 87% on CPAP prior to EMS arrival.  On initial arrival to the ED he was noted to have significant expiratory wheezing with multiple family members noted to be positive for RSV in the past several days.  Patient has tested positive for RSV upon arrival here at [facility] emergency department. Patient is Covid negative.  Chest x-ray does not reveal any definitive infiltrate  D-dimer is only minimally elevated and patient is less likely to be suffering from an acute pulmonary embolism.  No history of recent long distance travel.  Most likely, patient is suffering from an RSV acute bronchitis in a patient with multiple sclerosis and immunocompromise. Alternatively, patient may be suffering from a COPD exacerbation considering his longstanding history from smoking -patient does not carry a diagnosis of COPD.  Although BNP is elevated, low index of suspicion for decompensated CHF.  Continue IV  Solu-Medrol, bronchodilator nebulizations, aggressive pulmonary toilet and supportive care.  Discontinued Lasix.  Continue monitoring in stepdown unit overnight.  Was on BiPAP since arrival in the ED until approximately 8 AM today.  BiPAP as needed.  Wean oxygen as needed for oxygen saturations >92%.  Continue prior home CPAP at bedtime.   RSV infection  May have contracted from multiple family members who have RSV.  Supportive care.  Multiple sclerosis (HCC)  Continue home regimen of Vumerity  Follows with neurology at [facility]  Major depressive disorder  Continue prior home regimen.  Peripheral polyneuropathy  Continue Lyrica.  Type 2 diabetes mellitus with complication, without long-term current use of insulin (HCC)  Patient was diagnosed and hospitalized with diabetes mellitus type 2 with diabetic ketoacidosis in 2018  Patient states that he no longer has diabetes and that he is no longer on therapy.  Since he was initiated the patient on steroid therapy, monitor CBGs and placed on SSI  Follow hemoglobin A1c  Mixed hyperlipidemia  Continue home statin therapy.  GERD without esophagitis  Continue PPI.  Elevated brain natriuretic peptide (BNP) level  BNP 370 on admission.  Clinically however does not appear overtly volume overloaded.  Reassess in a.m. regarding need for any further IV Lasix.  Troponin negative.  TTE: Poor image quality.  LVEF 40 to 45%.  Global hypokinesis.  Grade 1 diastolic dysfunction.  Tobacco abuse  Smokes up to a pack per day for several years.  Cessation counseled.  Patient declines nicotine patch.  Syncope and collapse  Reportedly occurred  4 to 5 days [place].  This was reported by patient's daughter who indicated that after he drove back from the mountains, he has no recollection of how he got back home.  Monitor on telemetry for arrhythmias.  Check TTE.  Patient should not  drive for 6 months.  Thrombocytopenia (HCC)  Appears to be chronic.  Periodically follow CBC.  Cardiomyopathy (HCC)  TTE 9/26: LVEF 40-45% with global hypokinesis.  Consider cardiology consultation inpatient versus outpatient.    DVT prophylaxis: enoxaparin (LOVENOX) injection 40 mg Start: 09/12/20 1000     Code Status: Full Code  Family Communication: Discussed in detail with patient's daughter at bedside.  She works as a Licensed conveyancer at the [employer] ED.  Updated care and answered questions. Disposition:  Status is: Observation  The patient remains OBS appropriate and will d/c before 2 midnights.  Dispo: The patient is from: Home              Anticipated d/c is to: Home              Anticipated d/c date is: 1 day              Patient currently is not medically stable to d/c.        Consultants:   None  Procedures:   None  Antimicrobials:    Anti-infectives (From admission, onward)   None        Subjective:  Seen in ED.  Daughter at bedside.  Feels much better.  Dyspnea significantly improved.  Has some intermittent dry hacking cough.  No chest pain.  RT arrived and took him off of BiPAP.  Objective:   Vitals:   09/12/20 0757 09/12/20 0900 09/12/20 1000 09/12/20 1100  BP:  (!) 146/81 135/88 (!) 141/76  Pulse:   80 72  Resp:  (!) 27 (!) 23 (!) 30  Temp:      SpO2: 91%  93% 99%    General exam: Pleasant young male, moderately built and obese lying comfortably propped up in bed without distress. Respiratory system: Slightly harsh breath sounds with scattered occasional rhonchi but no crackles.  No increased work of breathing.  Able to speak in full sentences. Cardiovascular system: S1 & S2 heard, RRR. No JVD, murmurs, rubs, gallops or clicks. No pedal edema.  Telemetry personally reviewed: Sinus rhythm. Gastrointestinal system: Abdomen is nondistended, soft and nontender. No organomegaly or masses felt. Normal bowel sounds heard. Central  nervous system: Alert and oriented. No focal neurological deficits. Extremities: Symmetric 5 x 5 power. Skin: No rashes, lesions or ulcers Psychiatry: Judgement and insight appear normal. Mood & affect appropriate.     Data Reviewed:   I have personally reviewed following labs and imaging studies   CBC: Recent Labs  Lab 09/12/20 0015  WBC 8.5  NEUTROABS 7.0  HGB 16.5  HCT 50.1  MCV 90.3  PLT 109*    Basic Metabolic Panel: Recent Labs  Lab 09/12/20 0015  NA 136  K 3.8  CL 102  CO2 24  GLUCOSE 155*  BUN 8  CREATININE 1.09  CALCIUM 8.2*    Liver Function Tests: No results for input(s): AST, ALT, ALKPHOS, BILITOT, PROT, ALBUMIN in the last 168 hours.  CBG: Recent Labs  Lab 09/12/20 1002  GLUCAP 272*    Microbiology Studies:   Recent Results (from the past 240 hour(s))  Resp Panel by RT PCR (RSV, Flu A&B, Covid) -  Nasopharyngeal Swab     Status: Abnormal   Collection Time: 09/12/20 12:15 AM   Specimen: Nasopharyngeal Swab  Result Value Ref Range Status   SARS Coronavirus 2 by RT PCR NEGATIVE NEGATIVE Final    Comment: (NOTE) SARS-CoV-2 target nucleic acids are NOT DETECTED.  The SARS-CoV-2 RNA is generally detectable in upper respiratoy specimens during the acute phase of infection. The lowest concentration of SARS-CoV-2 viral copies this assay can detect is 131 copies/mL. A negative result does not preclude SARS-Cov-2 infection and should not be used as the sole basis for treatment or other patient management decisions. A negative result may occur with  improper specimen collection/handling, submission of specimen other than nasopharyngeal swab, presence of viral mutation(s) within the areas targeted by this assay, and inadequate number of viral copies (<131 copies/mL). A negative result must be combined with clinical observations, patient history, and epidemiological information. The expected result is Negative.  Fact Sheet for Patients:    https://www.moore.com/  Fact Sheet for Healthcare Providers:  https://www.young.biz/  This test is no t yet approved or cleared by the [place] FDA and  has been authorized for detection and/or diagnosis of SARS-CoV-2 by FDA under an Emergency Use Authorization (EUA). This EUA will remain  in effect (meaning this test can be used) for the duration of the COVID-19 declaration under Section 564(b)(1) of the Act, 21 U.S.C. section 360bbb-3(b)(1), unless the authorization is terminated or revoked sooner.     Influenza A by PCR NEGATIVE NEGATIVE Final   Influenza B by PCR NEGATIVE NEGATIVE Final    Comment: (NOTE) The Xpert Xpress SARS-CoV-2/FLU/RSV assay is intended as an aid in  the diagnosis of influenza from Nasopharyngeal swab specimens and  should not be used as a sole basis for treatment. Nasal washings and  aspirates are unacceptable for Xpert Xpress SARS-CoV-2/FLU/RSV  testing.  Fact Sheet for Patients: https://www.moore.com/  Fact Sheet for Healthcare Providers: https://www.young.biz/  This test is not yet approved or cleared by the [place] FDA and  has been authorized for detection and/or diagnosis of SARS-CoV-2 by  FDA under an Emergency Use Authorization (EUA). This EUA will remain  in effect (meaning this test can be used) for the duration of the  Covid-19 declaration under Section 564(b)(1) of the Act, 21  U.S.C. section 360bbb-3(b)(1), unless the authorization is  terminated or revoked.    Respiratory Syncytial Virus by PCR POSITIVE (A) NEGATIVE Final    Comment: (NOTE) Fact Sheet for Patients: https://www.moore.com/  Fact Sheet for Healthcare Providers: https://www.young.biz/  This test is not yet approved or cleared by the [place] FDA and  has been authorized for detection and/or diagnosis of SARS-CoV-2 by  FDA under an  Emergency Use Authorization (EUA). This EUA will remain  in effect (meaning this test can be used) for the duration of the  COVID-19 declaration under Section 564(b)(1) of the Act, 21 U.S.C.  section 360bbb-3(b)(1), unless the authorization is terminated or  revoked. Performed at [facility], [address] [place], [place] [address]   Blood culture (routine x 2)     Status: None (Preliminary result)   Collection Time: 09/12/20 12:15 AM   Specimen: BLOOD  Result Value Ref Range Status   Specimen Description   Final    BLOOD BLOOD RIGHT HAND Performed at [facility], [address] [place], [place] [address]    Special Requests   Final    BOTTLES DRAWN AEROBIC AND ANAEROBIC Blood Culture results  may not be optimal due to an inadequate volume of blood received in culture bottles Performed at [facility], [address] [place], [place] [address]    Culture   Final    NO GROWTH < 12 HOURS Performed at [facility], [address] [place], [place] [address]    Report Status PENDING  Incomplete  Blood culture (routine x 2)     Status: None (Preliminary result)   Collection Time: 09/12/20 12:15 AM   Specimen: BLOOD  Result Value Ref Range Status   Specimen Description   Final    BLOOD RIGHT ANTECUBITAL Performed at [facility], [address], [place], [place] [address]    Special Requests   Final    BOTTLES DRAWN AEROBIC AND ANAEROBIC Blood Culture adequate volume Performed at [facility], [address], [place], [place] [address]    Culture   Final    NO GROWTH < 12 HOURS Performed at [facility], [address], [place], [place] [address]    Report Status PENDING  Incomplete     Radiology Studies:  DG Chest Portable 1 View  Result Date: 09/12/2020 CLINICAL DATA:  Shortness of breath. No known COVID exposure. Current smoker. EXAM: PORTABLE CHEST 1 VIEW  COMPARISON:  None. FINDINGS: Shallow inspiration. Cardiac enlargement. No vascular congestion or edema. No focal consolidation or airspace disease. No pleural effusions. No pneumothorax. Mediastinal contours appear intact. Postoperative changes in the cervical spine. IMPRESSION: Cardiac enlargement. No evidence of active pulmonary disease. Electronically Signed   By: Burman Nieves M.D.   On: 09/12/2020 00:18   ECHOCARDIOGRAM COMPLETE  Result Date: 09/12/2020    ECHOCARDIOGRAM REPORT   Patient Name:   DANTRELL SCHERTZER [facility] Date of Exam: 09/12/2020 Medical Rec #:  [id number]      Height:       69.0 in Accession #:    [id number]     Weight:       215.6 lb Date of Birth:  Apr 04, 1961      BSA:          2.133 m Patient Age:    58 years       BP:           124/78 mmHg Patient Gender: M              HR:           79 bpm. Exam Location:  Inpatient Procedure: 2D Echo, Cardiac Doppler, Color Doppler and Intracardiac            Opacification Agent Indications:    I50.9* Heart failure (unspecified)  History:        Patient has no prior history of Echocardiogram examinations.                 Signs/Symptoms:Dyspnea and Shortness of Breath; Risk                 Factors:Sleep Apnea and Diabetes. Multiple sclerosis. Hypoxia.                 RSV.  Sonographer:    Sheralyn Boatman RDCS Referring Phys: [id number] Deno Lunger [facility]  Sonographer Comments: Technically difficult study due to poor echo windows, Technically challenging study due to limited acoustic windows, suboptimal parasternal window, suboptimal apical window and suboptimal subcostal window. Image acquisition challenging due to patient body habitus and Image acquisition challenging due to respiratory motion. Study is nearly non- diagnostic due  to respiratory motion and poor windows. Patient could not turn or bend left arm. Extremely difficult study. IMPRESSIONS  1. Poor image quality even with administration of Definity contrast. There appears to be global hypokinesis slightly worse  in the septum.. Left ventricular ejection fraction, by estimation, is 40 to 45%. The left ventricle has mildly decreased function.  The left ventricle demonstrates regional wall motion abnormalities (see scoring diagram/findings for description). There is mild concentric left ventricular hypertrophy. Left ventricular diastolic parameters are consistent with Grade I diastolic dysfunction (impaired relaxation).  2. Right ventricular systolic function is mildly reduced. The right ventricular size is normal.  3. The mitral valve is normal in structure. No evidence of mitral valve regurgitation. No evidence of mitral stenosis.  4. The aortic valve is normal in structure. Aortic valve regurgitation is not visualized. No aortic stenosis is present.  5. The inferior vena cava is normal in size with greater than 50% respiratory variability, suggesting right atrial pressure of 3 mmHg. FINDINGS  Left Ventricle: Poor image quality even with administration of Definity contrast. There appears to be global hypokinesis slightly worse in the septum. Left ventricular ejection fraction, by estimation, is 40 to 45%. The left ventricle has mildly decreased function. The left ventricle demonstrates regional wall motion abnormalities. Definity contrast agent was given IV to delineate the left ventricular endocardial borders. The left ventricular internal cavity size was normal in size. There is mild concentric left ventricular hypertrophy. Left ventricular diastolic parameters are consistent with Grade I diastolic dysfunction (impaired relaxation). Normal left ventricular filling pressure. Right Ventricle: The right ventricular size is normal. No increase in right ventricular wall thickness. Right ventricular systolic function is mildly reduced. Left Atrium: Left atrial size was normal in size. Right Atrium: Right atrial size was normal in size. Pericardium: There is no evidence of pericardial effusion. Mitral Valve: The mitral valve is  normal in structure. No evidence of mitral valve regurgitation. No evidence of mitral valve stenosis. Tricuspid Valve: The tricuspid valve is normal in structure. Tricuspid valve regurgitation is trivial. No evidence of tricuspid stenosis. Aortic Valve: The aortic valve is normal in structure. Aortic valve regurgitation is not visualized. No aortic stenosis is present. Pulmonic Valve: The pulmonic valve was normal in structure. Pulmonic valve regurgitation is not visualized. No evidence of pulmonic stenosis. Aorta: The aortic root is normal in size and structure. Venous: The inferior vena cava is normal in size with greater than 50% respiratory variability, suggesting right atrial pressure of 3 mmHg. IAS/Shunts: No atrial level shunt detected by color flow Doppler.  LEFT VENTRICLE PLAX 2D LVIDd:         5.57 cm  Diastology LVIDs:         4.90 cm  LV e' medial:    5.11 cm/s LV PW:         1.15 cm  LV E/e' medial:  8.2 LV IVS:        1.15 cm  LV e' lateral:   8.38 cm/s LVOT diam:     2.25 cm  LV E/e' lateral: 5.0 LV SV:         63 LV SV Index:   29 LVOT Area:     3.98 cm  RIGHT VENTRICLE RV S prime:     11.00 cm/s TAPSE (M-mode): 1.4 cm LEFT ATRIUM             Index       RIGHT ATRIUM  Index LA diam:        3.90 cm 1.83 cm/m  RA Area:     10.40 cm LA Vol (A2C):   29.7 ml 13.92 ml/m RA Volume:   17.90 ml  8.39 ml/m LA Vol (A4C):   13.1 ml 6.14 ml/m LA Biplane Vol: 18.5 ml 8.67 ml/m  AORTIC VALVE LVOT Vmax:   88.80 cm/s LVOT Vmean:  64.700 cm/s LVOT VTI:    0.158 m  AORTA Ao Root diam: 3.90 cm MITRAL VALVE               TRICUSPID VALVE MV Area (PHT): 3.21 cm    TR Peak grad:   7.7 mmHg MV Decel Time: 236 msec    TR Vmax:        139.00 cm/s MV E velocity: 42.00 cm/s MV A velocity: 68.10 cm/s  SHUNTS MV E/A ratio:  0.62        Systemic VTI:  0.16 m                            Systemic Diam: 2.25 cm Chilton Si MD Electronically signed by Chilton Si MD Signature Date/Time: 09/12/2020/11:50:50 AM     Final      Scheduled Meds:   . atorvastatin  10 mg Oral Daily  . buPROPion  150 mg Oral Daily  . Chlorhexidine Gluconate Cloth  6 each Topical Daily  . Diroximel Fumarate  462 mg Oral Daily  . DULoxetine  60 mg Oral Daily  . enoxaparin (LOVENOX) injection  40 mg Subcutaneous Q24H  . insulin aspart  0-15 Units Subcutaneous TID AC & HS  . levothyroxine  50 mcg Oral Q0600  . mouth rinse  15 mL Mouth Rinse BID  . methylPREDNISolone (SOLU-MEDROL) injection  40 mg Intravenous Q12H  . midodrine  5 mg Oral Daily  . pantoprazole  40 mg Oral Daily  . pregabalin  150 mg Oral TID    Continuous Infusions:     LOS: 0 days     Marcellus Scott, MD, [place], [facility]. [facility]    To contact the attending provider between 7A-7P or the covering provider during after hours 7P-7A, please log into the web site [email or web address] and access using universal [place] password for that web site. If you do not have the password, please call the hospital operator.  09/12/2020, 12:14 PM

## 2020-09-12 NOTE — Assessment & Plan Note (Signed)
   Appears to be chronic.  Periodically follow CBC.

## 2020-09-12 NOTE — ED Provider Notes (Signed)
[facility] DEPT Provider Note   CSN: [id number] Arrival date & time: 09/11/20  2328     History Chief Complaint  Patient presents with  . Shortness of Breath    Larry [name] is a 59 y.o. male.  HPI     This a 59 year old male with a history of arthritis, smoking, and diabetes who presents with shortness of breath and cough.  Patient reports onset of symptoms on Tuesday.  Reports progressive shortness of breath.  No known Covid exposures; however, he has 2 grandchildren who tested positive for RSV.  No known history of COPD but is a current smoker.  Was noted to be 88% on CPAP at home.  97% on a nonrebreather by EMS.  He did not note any fevers at home but temperature 100.7 here.  He was given nebulizer, Solu-Medrol, and 2 g of magnesium by EMS.  Patient is not vaccinated against COVID-19.  Past Medical History:  Diagnosis Date  . Arthritis   . Nerve pain     Patient Active Problem List   Diagnosis Date Noted  . Cervical stenosis of spinal canal 08/28/2017  . Hyponatremia 08/28/2017  . Elevated LFTs 08/28/2017  . Steroid-induced hyperglycemia 08/28/2017  . DKA (diabetic ketoacidoses) ([place]) 08/28/2017  . Thrombocytopenia ([place]) 08/28/2017    No past surgical history on file.     No family history on file.  Social History   Tobacco Use  . Smoking status: Current Every Day Smoker    Types: Cigarettes  . Smokeless tobacco: Never Used  Substance Use Topics  . Alcohol use: No  . Drug use: No    Home Medications Prior to Admission medications   Medication Sig Start Date End Date Taking? Authorizing Provider  atorvastatin (LIPITOR) 10 MG tablet Take 10 mg by mouth daily.   Yes [provider]  buPROPion (WELLBUTRIN XL) 150 MG 24 hr tablet Take 150 mg by mouth daily.   Yes [provider]  Diroximel Fumarate (VUMERITY) 231 MG CPDR Take 2 capsules by mouth in the morning and at bedtime.   Yes [provider]    DULoxetine (CYMBALTA) 60 MG capsule Take 60 mg by mouth daily. 08/07/17  Yes [provider]  glucose blood (FREESTYLE TEST STRIPS) test strip Use as instructed 08/30/17  Yes Ghimire, Henreitta Leber, MD  glucose monitoring kit (FREESTYLE) monitoring kit 1 each by Does not apply route 4 (four) times daily - after meals and at bedtime. 1 month Diabetic Testing Supplies for QAC-QHS accuchecks. 08/30/17  Yes Ghimire, Henreitta Leber, MD  Lancets (FREESTYLE) lancets Use as instructed 08/30/17  Yes Ghimire, Henreitta Leber, MD  methocarbamol (ROBAXIN) 750 MG tablet Take 750 mg by mouth 3 (three) times daily.   Yes [provider]  midodrine (PROAMATINE) 5 MG tablet Take 5 mg by mouth daily.   Yes [provider]  omeprazole (PRILOSEC) 40 MG capsule Take 40 mg by mouth daily.   Yes [provider]  pregabalin (LYRICA) 150 MG capsule Take 150 mg by mouth in the morning, at noon, and at bedtime.   Yes [provider]  zolpidem (AMBIEN) 10 MG tablet Take 10 mg by mouth at bedtime as needed for sleep.   Yes [provider]  acetaminophen (TYLENOL) 500 MG tablet Take 500 mg by mouth daily. Patient not taking: Reported on 09/12/2020    [provider]  dexamethasone (DECADRON) 4 MG tablet Take 4 mg BID for 2 days, then Take 4  mg daily for 2 days, then Take 2 mg (0.5 tablet) for 2 days and then stop Patient not taking: Reported on 09/12/2020 08/30/17   Jonetta Osgood, MD  insulin aspart (NOVOLOG FLEXPEN) 100 UNIT/ML FlexPen Inject 6 Units into the skin 3 (three) times daily with meals. Patient not taking: Reported on 09/12/2020 08/30/17   Jonetta Osgood, MD  Insulin Glargine (LANTUS) 100 UNIT/ML Solostar Pen Inject 25 Units into the skin daily at 10 pm. Patient not taking: Reported on 09/12/2020 08/30/17   Jonetta Osgood, MD  Insulin Pen Needle 32G X 8 MM MISC Use as directed Patient not taking: Reported on 09/12/2020 08/30/17   Jonetta Osgood, MD   levothyroxine (SYNTHROID) 50 MCG tablet Take 50 mcg by mouth daily before breakfast.    [provider]  meloxicam (MOBIC) 15 MG tablet Take 15 mg by mouth daily. Patient not taking: Reported on 09/12/2020    [provider]  metFORMIN (GLUCOPHAGE) 500 MG tablet Take 1 tablet (500 mg total) by mouth 2 (two) times daily with a meal. Patient not taking: Reported on 09/12/2020 08/30/17 08/30/18  Jonetta Osgood, MD    Allergies    Aspirin  Review of Systems   Review of Systems  Constitutional: Positive for fever.  Respiratory: Positive for cough, shortness of breath and wheezing.   Cardiovascular: Negative for chest pain and leg swelling.  Gastrointestinal: Negative for abdominal pain, nausea and vomiting.  All other systems reviewed and are negative.   Physical Exam Updated Vital Signs BP (!) 142/88   Pulse 84   Temp 99 F (37.2 C)   Resp (!) 23   SpO2 95%   Physical Exam Vitals and nursing note reviewed.  Constitutional:      Appearance: He is well-developed.     Comments: Ill-appearing but nontoxic  HENT:     Head: Normocephalic and atraumatic.     Mouth/Throat:     Comments: Mucous membranes dry Eyes:     Pupils: Pupils are equal, round, and reactive to light.  Cardiovascular:     Rate and Rhythm: Normal rate and regular rhythm.     Heart sounds: Normal heart sounds. No murmur heard.   Pulmonary:     Effort: Pulmonary effort is normal. Tachypnea present. No respiratory distress.     Breath sounds: Wheezing and rhonchi present.     Comments: Tachypnea noted with increased work of breathing, speaking in short sentences, wheezing and rhonchi in all lung fields Abdominal:     General: Bowel sounds are normal.     Palpations: Abdomen is soft.     Tenderness: There is no abdominal tenderness. There is no rebound.  Musculoskeletal:     Cervical back: Neck supple.     Right lower leg: No tenderness. No edema.     Left lower leg: No tenderness. No  edema.  Lymphadenopathy:     Cervical: No cervical adenopathy.  Skin:    General: Skin is warm and dry.  Neurological:     Mental Status: He is alert and oriented to person, place, and time.  Psychiatric:        Mood and Affect: Mood normal.     ED Results / Procedures / Treatments   Labs (all labs ordered are listed, but only abnormal results are displayed) Labs Reviewed  RESP PANEL BY RT PCR (RSV, FLU A&B, COVID) - Abnormal; Notable for the following components:      Result Value   Respiratory Syncytial  Virus by PCR POSITIVE (*)    All other components within normal limits  CBC WITH DIFFERENTIAL/PLATELET - Abnormal; Notable for the following components:   Platelets 109 (*)    Abs Immature Granulocytes 0.11 (*)    All other components within normal limits  BASIC METABOLIC PANEL - Abnormal; Notable for the following components:   Glucose, Bld 155 (*)    Calcium 8.2 (*)    All other components within normal limits  BLOOD GAS, ARTERIAL - Abnormal; Notable for the following components:   Acid-base deficit 2.7 (*)    All other components within normal limits  CULTURE, BLOOD (ROUTINE X 2)  CULTURE, BLOOD (ROUTINE X 2)  BRAIN NATRIURETIC PEPTIDE    EKG EKG Interpretation  Date/Time:  Saturday September 11 2020 23:57:19 EDT Ventricular Rate:  97 PR Interval:    QRS Duration: 100 QT Interval:  347 QTC Calculation: 441 R Axis:   19 Text Interpretation: Sinus rhythm Borderline repolarization abnormality Confirmed by Thayer Jew [phone]) on 09/12/2020 12:16:34 AM   Radiology DG Chest Portable 1 View  Result Date: 09/12/2020 CLINICAL DATA:  Shortness of breath. No known COVID exposure. Current smoker. EXAM: PORTABLE CHEST 1 VIEW COMPARISON:  None. FINDINGS: Shallow inspiration. Cardiac enlargement. No vascular congestion or edema. No focal consolidation or airspace disease. No pleural effusions. No pneumothorax. Mediastinal contours appear intact. Postoperative changes in  the cervical spine. IMPRESSION: Cardiac enlargement. No evidence of active pulmonary disease. Electronically Signed   By: Lucienne Capers M.D.   On: 09/12/2020 00:18    Procedures Procedures (including critical care time)  CRITICAL CARE Performed by: Merryl Hacker   Total critical care time: 45 minutes  Critical care time was exclusive of separately billable procedures and treating other patients.  Critical care was necessary to treat or prevent imminent or life-threatening deterioration.  Critical care was time spent personally by me on the following activities: development of treatment plan with patient and/or surrogate as well as nursing, discussions with consultants, evaluation of patient's response to treatment, examination of patient, obtaining history from patient or surrogate, ordering and performing treatments and interventions, ordering and review of laboratory studies, ordering and review of radiographic studies, pulse oximetry and re-evaluation of patient's condition. Medications Ordered in ED Medications  albuterol (VENTOLIN HFA) 108 (90 Base) MCG/ACT inhaler 6 puff (6 puffs Inhalation Given 09/12/20 0017)  dexamethasone (DECADRON) injection 10 mg (10 mg Intravenous Given 09/12/20 0017)    ED Course  I have reviewed the triage vital signs and the nursing notes.  Pertinent labs & imaging results that were available during my care of the patient were reviewed by me and considered in my medical decision making (see chart for details).    MDM Rules/Calculators/A&P                          Patient presents with shortness of breath and cough.  He is in respiratory distress on my evaluation with tachypnea and hypoxia.  He is currently on a nonrebreather.  He is not on home oxygen but does wear a CPAP at night.  He has a history of smoking but no formal diagnosis of COPD.  Considerations include but not limited to, pneumonia, COVID-19, RSV given more virulence in adults this  season.  Patient was given an inhaler.  Respiratory viral panel was sent.  Chest x-ray does not show any evidence of pneumonia.  It does show some cardiac enlargement.  EKG without ischemic  or arrhythmic changes.  He is chest pain-free.  Doubt ACS.  BNP is pending.  Patient placed on BiPAP after RVP panel positive for RSV.  4:04 AM On reassessment, patient is much more comfortable on BiPAP.  Given his hypoxia and significant respiratory distress requiring BiPAP, will admit to the hospital for further evaluation.  Suspect he has an acute viral infection with underlying COPD.  Fabio Wah Fillion was evaluated in Emergency Department on 09/12/2020 for the symptoms described in the history of present illness. He was evaluated in the context of the global COVID-19 pandemic, which necessitated consideration that the patient might be at risk for infection with the SARS-CoV-2 virus that causes COVID-19. Institutional protocols and algorithms that pertain to the evaluation of patients at risk for COVID-19 are in a state of rapid change based on information released by regulatory bodies including the CDC and federal and state organizations. These policies and algorithms were followed during the patient's care in the ED.    Final Clinical Impression(s) / ED Diagnoses Final diagnoses:  Acute respiratory failure with hypoxia ([place])  RSV infection  COPD exacerbation ([place])    Rx / DC Orders ED Discharge Orders    None       Dessiree Sze, Barbette Hair, MD 09/12/20 [phone]

## 2020-09-12 NOTE — Assessment & Plan Note (Signed)
·   Smokes up to a pack per day for several years.  Cessation counseled.  Patient declines nicotine patch.

## 2020-09-12 NOTE — Assessment & Plan Note (Signed)
Continue Lyrica. 

## 2020-09-12 NOTE — ED Notes (Signed)
Daughter, Marchelle Folks, [phone], updated on patient status.

## 2020-09-12 NOTE — H&P (Addendum)
History and Physical    Larry [name] MVH:[id number] DOB: [birth year] DOA: 09/11/2020  PCP: [name], MD  Patient coming from: Home   Chief Complaint:  Chief Complaint  Patient presents with   Shortness of Breath     HPI:    59 year old male with past medical history of diabetes mellitus type 2 (diagnosed in 2018, no longer on therapy), obstructive sleep apnea, gastroesophageal reflux disease, peripheral neuropathy, depression, hyperlipidemia, multiple sclerosis who presents to [facility] emergency department with complaints of shortness of breath.  Patient explains that approximately 4 days ago he began to develop shortness of breath. Initially shortness of breath was mild in intensity and progressively became more severe. Shortness of breath is worse with minimal exertion and improved with rest. Shortness of breath associated with intense wheezing and frequent bouts of cough with minimal amounts of clear sputum. Patient is also complaining of subjective fevers, poor appetite and generalized muscle aches.  Patient states that he has had multiple sick contacts, all of which are family and all of which were positive for RSV.  Of note, patient is not vaccinated for COVID-19.  Eventually EMS was called to bring the patient into the hospital. Upon EMS arrival, patient was on his home CPAP machine and was noted to be saturating at 88%. Patient was transitioned to nonrebreather mask and brought to [facility] emergency department for evaluation.  Upon evaluation in the emergency department patient was found to be in respiratory distress. Patient was transitioned from nonrebreather mask to BiPAP for work of breathing. Viral panel came back positive for RSV. Patient was initiated on bronchodilator therapy and given a dose of intravenous steroids and the hospitalist group was then called to assess patient for admission in the hospital.  Review of Systems:   Review of  Systems  Respiratory: Positive for cough, sputum production, shortness of breath and wheezing.   Neurological: Positive for weakness.  All other systems reviewed and are negative.   Past Medical History:  Diagnosis Date   Arthritis    Nerve pain     History reviewed. No pertinent surgical history.   reports that he has been smoking cigarettes. He has never used smokeless tobacco. He reports that he does not drink alcohol and does not use drugs.  Allergies  Allergen Reactions   Aspirin     Bleeding    Family History  Problem Relation Age of Onset   Lung cancer Neg Hx      Prior to Admission medications   Medication Sig Start Date End Date Taking? Authorizing Provider  atorvastatin (LIPITOR) 10 MG tablet Take 10 mg by mouth daily.   Yes [provider]  buPROPion (WELLBUTRIN XL) 150 MG 24 hr tablet Take 150 mg by mouth daily.   Yes [provider]  Diroximel Fumarate (VUMERITY) 231 MG CPDR Take 2 capsules by mouth in the morning and at bedtime.   Yes [provider]  DULoxetine (CYMBALTA) 60 MG capsule Take 60 mg by mouth daily. 08/07/17  Yes [provider]  glucose blood (FREESTYLE TEST STRIPS) test strip Use as instructed 08/30/17  Yes Ghimire, Henreitta Leber, MD  glucose monitoring kit (FREESTYLE) monitoring kit 1 each by Does not apply route 4 (four) times daily - after meals and at bedtime. 1 month Diabetic Testing Supplies for QAC-QHS accuchecks. 08/30/17  Yes Ghimire, Henreitta Leber, MD  Lancets (FREESTYLE) lancets Use as instructed 08/30/17  Yes Ghimire, Henreitta Leber, MD  methocarbamol (ROBAXIN) 750  MG tablet Take 750 mg by mouth 3 (three) times daily.   Yes [provider]  midodrine (PROAMATINE) 5 MG tablet Take 5 mg by mouth daily.   Yes [provider]  omeprazole (PRILOSEC) 40 MG capsule Take 40 mg by mouth daily.   Yes [provider]  pregabalin (LYRICA) 150 MG capsule Take 150 mg by mouth in the morning, at noon,  and at bedtime.   Yes [provider]  zolpidem (AMBIEN) 10 MG tablet Take 10 mg by mouth at bedtime as needed for sleep.   Yes [provider]  acetaminophen (TYLENOL) 500 MG tablet Take 500 mg by mouth daily. Patient not taking: Reported on 09/12/2020    [provider]  dexamethasone (DECADRON) 4 MG tablet Take 4 mg BID for 2 days, then Take 4 mg daily for 2 days, then Take 2 mg (0.5 tablet) for 2 days and then stop Patient not taking: Reported on 09/12/2020 08/30/17   Jonetta Osgood, MD  insulin aspart (NOVOLOG FLEXPEN) 100 UNIT/ML FlexPen Inject 6 Units into the skin 3 (three) times daily with meals. Patient not taking: Reported on 09/12/2020 08/30/17   Jonetta Osgood, MD  Insulin Glargine (LANTUS) 100 UNIT/ML Solostar Pen Inject 25 Units into the skin daily at 10 pm. Patient not taking: Reported on 09/12/2020 08/30/17   Jonetta Osgood, MD  Insulin Pen Needle 32G X 8 MM MISC Use as directed Patient not taking: Reported on 09/12/2020 08/30/17   Jonetta Osgood, MD  levothyroxine (SYNTHROID) 50 MCG tablet Take 50 mcg by mouth daily before breakfast.    [provider]  meloxicam (MOBIC) 15 MG tablet Take 15 mg by mouth daily. Patient not taking: Reported on 09/12/2020    [provider]  metFORMIN (GLUCOPHAGE) 500 MG tablet Take 1 tablet (500 mg total) by mouth 2 (two) times daily with a meal. Patient not taking: Reported on 09/12/2020 08/30/17 08/30/18  Jonetta Osgood, MD    Physical Exam: Vitals:   09/12/20 0530 09/12/20 0600 09/12/20 0630 09/12/20 0700  BP: 140/85 (!) 138/95 (!) 145/93 124/78  Pulse: 85 83 81 79  Resp: 19 (!) 23 (!) 22 (!) 22  Temp:      SpO2: 98% 96% 97% 97%     Constitutional: Acute alert and oriented x3, patient is in respiratory distress. Patient is obese. Skin: no rashes, no lesions, good skin turgor noted. Eyes: Pupils are equally reactive to light.  No evidence of scleral icterus or conjunctival pallor.   ENMT: Moist mucous membranes noted.  Posterior pharynx clear of any exudate or lesions.   Neck: normal, supple, no masses, no thyromegaly.  No evidence of jugular venous distension.   Respiratory: Coarse breath sounds bilaterally with expiratory wheezing and prolonged expiratory phase. No evidence of rales. Patient is tachypneic without evidence of accessory muscle use.  Cardiovascular: Regular rate and rhythm, no murmurs / rubs / gallops. No extremity edema. 2+ pedal pulses. No carotid bruits.  Chest:   Nontender without crepitus or deformity.   Back:   Nontender without crepitus or deformity. Abdomen: Abdomen is protuberant but soft and nontender.  No evidence of intra-abdominal masses.  Positive bowel sounds noted in all quadrants.   Musculoskeletal: No joint deformity upper and lower extremities. Good ROM, no contractures. Normal muscle tone.  Neurologic: CN 2-12 grossly intact. Sensation intact.  Patient moving all 4 extremities spontaneously.  Patient is following all commands.  Patient is responsive to verbal stimuli.  Psychiatric: Patient exhibits normal mood with appropriate affect.  Patient seems to possess insight as to their current situation.     Labs on Admission: I have personally reviewed following labs and imaging studies -   CBC: Recent Labs  Lab 09/12/20 0015  WBC 8.5  NEUTROABS 7.0  HGB 16.5  HCT 50.1  MCV 90.3  PLT 408*   Basic Metabolic Panel: Recent Labs  Lab 09/12/20 0015  NA 136  K 3.8  CL 102  CO2 24  GLUCOSE 155*  BUN 8  CREATININE 1.09  CALCIUM 8.2*   GFR: CrCl cannot be calculated (Unknown ideal weight.). Liver Function Tests: No results for input(s): AST, ALT, ALKPHOS, BILITOT, PROT, ALBUMIN in the last 168 hours. No results for input(s): LIPASE, AMYLASE in the last 168 hours. No results for input(s): AMMONIA in the last 168 hours. Coagulation Profile: No results for input(s): INR, PROTIME in the last 168 hours. Cardiac Enzymes: No  results for input(s): CKTOTAL, CKMB, CKMBINDEX, TROPONINI in the last 168 hours. BNP (last 3 results) No results for input(s): PROBNP in the last 8760 hours. HbA1C: No results for input(s): HGBA1C in the last 72 hours. CBG: No results for input(s): GLUCAP in the last 168 hours. Lipid Profile: No results for input(s): CHOL, HDL, LDLCALC, TRIG, CHOLHDL, LDLDIRECT in the last 72 hours. Thyroid Function Tests: No results for input(s): TSH, T4TOTAL, FREET4, T3FREE, THYROIDAB in the last 72 hours. Anemia Panel: No results for input(s): VITAMINB12, FOLATE, FERRITIN, TIBC, IRON, RETICCTPCT in the last 72 hours. Urine analysis:    Component Value Date/Time   COLORURINE STRAW (A) 08/28/2017 1801   APPEARANCEUR CLEAR 08/28/2017 1801   LABSPEC 1.031 (H) 08/28/2017 1801   PHURINE 5.0 08/28/2017 1801   GLUCOSEU >=500 (A) 08/28/2017 1801   HGBUR NEGATIVE 08/28/2017 1801   BILIRUBINUR NEGATIVE 08/28/2017 1801   KETONESUR 5 (A) 08/28/2017 1801   PROTEINUR NEGATIVE 08/28/2017 1801   NITRITE NEGATIVE 08/28/2017 1801   LEUKOCYTESUR NEGATIVE 08/28/2017 1801    Radiological Exams on Admission - Personally Reviewed: DG Chest Portable 1 View  Result Date: 09/12/2020 CLINICAL DATA:  Shortness of breath. No known COVID exposure. Current smoker. EXAM: PORTABLE CHEST 1 VIEW COMPARISON:  None. FINDINGS: Shallow inspiration. Cardiac enlargement. No vascular congestion or edema. No focal consolidation or airspace disease. No pleural effusions. No pneumothorax. Mediastinal contours appear intact. Postoperative changes in the cervical spine. IMPRESSION: Cardiac enlargement. No evidence of active pulmonary disease. Electronically Signed   By: Lucienne Capers M.D.   On: 09/12/2020 00:18    EKG: Personally reviewed.  Rhythm is normal sinus rhythm with heart rate of 97 bpm.  No dynamic ST segment changes appreciated.  Assessment/Plan Principal Problem:   Acute respiratory failure with hypoxia (HCC)   oxygen  saturations noted to be in the 80s by EMS while on CPAP.  On examination patient noted to have significant expiratory wheezing with multiple family members noted to be positive for RSV in the past several days.  Patient has tested positive for RSV upon arrival here at [facility] emergency department. Patient is Covid negative.  Chest x-ray does not reveal any definitive infiltrate  D-dimer is only minimally elevated and patient is unlikely to be suffering from an acute pulmonary embolism  Most likely, patient is suffering from an RSV infection/bronchitis in a patient with multiple sclerosis and immunocompromise. Alternatively, patient may be suffering from a COPD exacerbation considering his longstanding history from smoking -patient does not carry a diagnosis of COPD.  Finally, with evidence of cardiomegaly on chest x-ray and elevated BNP, there may be a contributing element of acute cardiogenic volume overload.  Placing patient on aggressive bronchodilator therapy, intravenous systemic steroids  Also providing patient with a trial dose of intravenous Lasix and obtaining an echocardiogram.  Admitting patient to stepdown unit, monitoring patient for improvement. Symptomatic  Active Problems:   RSV infection   Please see assessment and plan above    Multiple sclerosis (HCC)   Continue home regimen of Vumerity    Major depressive disorder   Continue home regimen of psychotropic therapies    Peripheral polyneuropathy   Continue home regimen of scheduled Lyrica    Elevated brain natriuretic peptide (BNP) level   Evidence of cardiomegaly on chest x-ray with elevated BNP  Concern for possible underlying cardiomyopathy  Obtaining echocardiogram  Providing patient with a trial dose of intravenous Lasix and monitoring for symptomatic improvement.    Type 2 diabetes mellitus with complication, without long-term current use of insulin ([place])   Patient was diagnosed and  hospitalized with diabetes mellitus type 2 with diabetic ketoacidosis in 2018  Patient states that he no longer has diabetes and that he is no longer on therapy.  Since we have initiated the patient on steroid therapy, I have placed the patient back on Accu-Cheks before every meal and nightly with sliding scale insulin  Obtaining hemoglobin A1c  Will initiate diabetic therapy as a discharge if indicated.    Obstructive sleep apnea   Once patient is transitioned off of BiPAP patient will need to go back to his usual regimen of CPAP nightly.    GERD without esophagitis   PPI daily per home regimen.    Mixed hyperlipidemia  Continue home regimen of statin therapy.   Code Status:  Full code Family Communication: Daughter is at bedside and has been updated on plan of care.  Status is: Observation  The patient remains OBS appropriate and will d/c before 2 midnights.  Dispo: The patient is from: Home              Anticipated d/c is to: Home              Anticipated d/c date is: 2 days              Patient currently is not medically stable to d/c.        Vernelle Emerald MD [facility] Pager [phone]  If 7PM-7AM, please contact night-coverage [email or web address] Use universal [place] password for that web site. If you do not have the password, please call the hospital operator.  09/12/2020, 7:31 AM

## 2020-09-12 NOTE — Assessment & Plan Note (Signed)
   Continue prior home regimen.

## 2020-09-12 NOTE — ED Notes (Signed)
Called report to [place], [place]

## 2020-09-12 NOTE — Progress Notes (Signed)
Updated daughter who works in the ED department.  He will text her to take his medications home tomorrow morning and his [employer] specialty medication being sent to pharmacy to obtain a barcode.

## 2020-09-12 NOTE — Plan of Care (Signed)
Discussed with patient plan of care for the evening, pain management and wearing CPAP at bedtime with some teach back displayed

## 2020-09-12 NOTE — Assessment & Plan Note (Signed)
·   TTE 9/26: LVEF 40-45% with global hypokinesis.  Consider cardiology consultation inpatient versus outpatient.

## 2020-09-12 NOTE — CV Procedure (Signed)
2D echo attempted. Patient with MD, and RT. Will try later.

## 2020-09-12 NOTE — Assessment & Plan Note (Signed)
Continue PPI ?

## 2020-09-12 NOTE — Assessment & Plan Note (Addendum)
   BNP 370 on admission.  Clinically however does not appear overtly volume overloaded.  Reassess in a.m. regarding need for any further IV Lasix.  Troponin negative.  TTE: Poor image quality.  LVEF 40 to 45%.  Global hypokinesis.  Grade 1 diastolic dysfunction.

## 2020-09-13 ENCOUNTER — Encounter (HOSPITAL_COMMUNITY): Payer: Self-pay | Admitting: Internal Medicine

## 2020-09-13 ENCOUNTER — Other Ambulatory Visit: Payer: Self-pay | Admitting: Student

## 2020-09-13 ENCOUNTER — Observation Stay (HOSPITAL_COMMUNITY): Payer: Medicare HMO

## 2020-09-13 DIAGNOSIS — I110 Hypertensive heart disease with heart failure: Secondary | ICD-10-CM | POA: Diagnosis present

## 2020-09-13 DIAGNOSIS — Z79899 Other long term (current) drug therapy: Secondary | ICD-10-CM | POA: Diagnosis not present

## 2020-09-13 DIAGNOSIS — R0602 Shortness of breath: Secondary | ICD-10-CM | POA: Diagnosis present

## 2020-09-13 DIAGNOSIS — J9601 Acute respiratory failure with hypoxia: Secondary | ICD-10-CM

## 2020-09-13 DIAGNOSIS — D696 Thrombocytopenia, unspecified: Secondary | ICD-10-CM | POA: Diagnosis present

## 2020-09-13 DIAGNOSIS — Z79890 Hormone replacement therapy (postmenopausal): Secondary | ICD-10-CM | POA: Diagnosis not present

## 2020-09-13 DIAGNOSIS — I429 Cardiomyopathy, unspecified: Secondary | ICD-10-CM

## 2020-09-13 DIAGNOSIS — M1990 Unspecified osteoarthritis, unspecified site: Secondary | ICD-10-CM | POA: Diagnosis present

## 2020-09-13 DIAGNOSIS — B974 Respiratory syncytial virus as the cause of diseases classified elsewhere: Secondary | ICD-10-CM | POA: Diagnosis not present

## 2020-09-13 DIAGNOSIS — I420 Dilated cardiomyopathy: Secondary | ICD-10-CM

## 2020-09-13 DIAGNOSIS — R55 Syncope and collapse: Secondary | ICD-10-CM

## 2020-09-13 DIAGNOSIS — E1142 Type 2 diabetes mellitus with diabetic polyneuropathy: Secondary | ICD-10-CM | POA: Diagnosis present

## 2020-09-13 DIAGNOSIS — K219 Gastro-esophageal reflux disease without esophagitis: Secondary | ICD-10-CM | POA: Diagnosis present

## 2020-09-13 DIAGNOSIS — E782 Mixed hyperlipidemia: Secondary | ICD-10-CM | POA: Diagnosis present

## 2020-09-13 DIAGNOSIS — F17210 Nicotine dependence, cigarettes, uncomplicated: Secondary | ICD-10-CM | POA: Diagnosis present

## 2020-09-13 DIAGNOSIS — Z8249 Family history of ischemic heart disease and other diseases of the circulatory system: Secondary | ICD-10-CM | POA: Diagnosis not present

## 2020-09-13 DIAGNOSIS — G4733 Obstructive sleep apnea (adult) (pediatric): Secondary | ICD-10-CM | POA: Diagnosis present

## 2020-09-13 DIAGNOSIS — Z886 Allergy status to analgesic agent status: Secondary | ICD-10-CM | POA: Diagnosis not present

## 2020-09-13 DIAGNOSIS — Z20822 Contact with and (suspected) exposure to covid-19: Secondary | ICD-10-CM | POA: Diagnosis present

## 2020-09-13 DIAGNOSIS — J205 Acute bronchitis due to respiratory syncytial virus: Secondary | ICD-10-CM | POA: Diagnosis present

## 2020-09-13 DIAGNOSIS — Z6831 Body mass index (BMI) 31.0-31.9, adult: Secondary | ICD-10-CM | POA: Diagnosis not present

## 2020-09-13 DIAGNOSIS — E669 Obesity, unspecified: Secondary | ICD-10-CM | POA: Diagnosis present

## 2020-09-13 DIAGNOSIS — F329 Major depressive disorder, single episode, unspecified: Secondary | ICD-10-CM | POA: Diagnosis present

## 2020-09-13 DIAGNOSIS — I5021 Acute systolic (congestive) heart failure: Secondary | ICD-10-CM | POA: Diagnosis present

## 2020-09-13 DIAGNOSIS — G35 Multiple sclerosis: Secondary | ICD-10-CM | POA: Diagnosis present

## 2020-09-13 LAB — COMPREHENSIVE METABOLIC PANEL
ALT: 48 U/L — ABNORMAL HIGH (ref 0–44)
AST: 29 U/L (ref 15–41)
Albumin: 4.2 g/dL (ref 3.5–5.0)
Alkaline Phosphatase: 68 U/L (ref 38–126)
Anion gap: 11 (ref 5–15)
BUN: 23 mg/dL — ABNORMAL HIGH (ref 6–20)
CO2: 27 mmol/L (ref 22–32)
Calcium: 9.1 mg/dL (ref 8.9–10.3)
Chloride: 102 mmol/L (ref 98–111)
Creatinine, Ser: 1.1 mg/dL (ref 0.61–1.24)
GFR calc Af Amer: >60 mL/min (ref >60)
GFR calc non Af Amer: >60 mL/min (ref >60)
Glucose, Bld: 204 mg/dL — ABNORMAL HIGH (ref 70–99)
Potassium: 4.6 mmol/L (ref 3.5–5.1)
Sodium: 140 mmol/L (ref 135–145)
Total Bilirubin: 0.4 mg/dL (ref 0.3–1.2)
Total Protein: 7.4 g/dL (ref 6.5–8.1)

## 2020-09-13 LAB — GLUCOSE, CAPILLARY
Glucose-Capillary: 164 mg/dL — ABNORMAL HIGH (ref 70–99)
Glucose-Capillary: 222 mg/dL — ABNORMAL HIGH (ref 70–99)
Glucose-Capillary: 198 mg/dL — ABNORMAL HIGH (ref 70–99)
Glucose-Capillary: 128 mg/dL — ABNORMAL HIGH (ref 70–99)

## 2020-09-13 LAB — CBC WITH DIFFERENTIAL/PLATELET
Abs Immature Granulocytes: 0.05 10*3/uL (ref 0.00–0.07)
Basophils Absolute: 0 10*3/uL (ref 0.0–0.1)
Basophils Relative: 0 %
Eosinophils Absolute: 0 10*3/uL (ref 0.0–0.5)
Eosinophils Relative: 0 %
HCT: 47.4 % (ref 39.0–52.0)
Hemoglobin: 15.8 g/dL (ref 13.0–17.0)
Immature Granulocytes: 0 %
Lymphocytes Relative: 7 %
Lymphs Abs: 0.8 10*3/uL (ref 0.7–4.0)
MCH: 30.3 pg (ref 26.0–34.0)
MCHC: 33.3 g/dL (ref 30.0–36.0)
MCV: 90.8 fL (ref 80.0–100.0)
Monocytes Absolute: 0.7 10*3/uL (ref 0.1–1.0)
Monocytes Relative: 5 %
Neutro Abs: 11.4 10*3/uL — ABNORMAL HIGH (ref 1.7–7.7)
Neutrophils Relative %: 88 %
Platelets: 126 10*3/uL — ABNORMAL LOW (ref 150–400)
RBC: 5.22 MIL/uL (ref 4.22–5.81)
RDW: 13.9 % (ref 11.5–15.5)
WBC: 13 10*3/uL — ABNORMAL HIGH (ref 4.0–10.5)
nRBC: 0 % (ref 0.0–0.2)

## 2020-09-13 LAB — MAGNESIUM: Magnesium: 2.4 mg/dL (ref 1.7–2.4)

## 2020-09-13 MED ORDER — LOSARTAN POTASSIUM 25 MG PO TABS
12.5000 mg | ORAL_TABLET | Freq: Every day | ORAL | Status: DC
  Administered 2020-09-13 – 2020-09-14 (×2): 12.5 mg via ORAL
  Filled 2020-09-13 (×2): qty 0.5

## 2020-09-13 MED ORDER — IOHEXOL 350 MG/ML SOLN
100.0000 mL | Freq: Once | INTRAVENOUS | Status: AC | PRN
  Administered 2020-09-13: 100 mL via INTRAVENOUS

## 2020-09-13 MED ORDER — PREDNISONE 20 MG PO TABS
40.0000 mg | ORAL_TABLET | Freq: Every day | ORAL | Status: DC
  Administered 2020-09-14: 40 mg via ORAL
  Filled 2020-09-13: qty 2

## 2020-09-13 MED ORDER — DIROXIMEL FUMARATE 231 MG PO CPDR
462.0000 mg | DELAYED_RELEASE_CAPSULE | Freq: Two times a day (BID) | ORAL | Status: DC
  Administered 2020-09-13 – 2020-09-14 (×3): 462 mg via ORAL

## 2020-09-13 NOTE — Progress Notes (Signed)
SATURATION QUALIFICATIONS: (This note is used to comply with regulatory documentation for home oxygen)  Patient Saturations on Room Air at Rest = 92%  Patient Saturations on Room Air while Ambulating = 86%  Patient Saturations on 2 Liters of oxygen while Ambulating = 94%  Please briefly explain why patient needs home oxygen: desaturation with exertion

## 2020-09-13 NOTE — Plan of Care (Signed)
Discussed with patient plan of care for the evening, pain management and possible discharge tomorrow morning with some teach back displayed.  Daughter may visit him tonight she is an employee in the ED department at [employer] [facility] [employer].

## 2020-09-13 NOTE — Progress Notes (Signed)
PT Cancellation Note  Patient Details Name: Larry [name] MRN: [id number] DOB: Dec 25, 1960   Cancelled Treatment:    Reason Eval/Treat Not Completed: Other (comment);Medical issues which prohibited therapy (Patient to have CT to rule out acute PE. Will follow up at later time/date as schedule allows and pt medically able to particiapte.)   Renaldo Fiddler PT, DPT Acute Rehabilitation Services  Office [phone] Pager [phone]  09/13/2020 11:26 AM

## 2020-09-13 NOTE — Progress Notes (Signed)
Ordered 30 day Event Monitor for further evaluation of syncope. Ordered coronary CTA for further evaluation of new cardiomyopathy.  Please see consult note from today for more details.  Corrin Parker, PA-C 09/13/2020 4:17 PM

## 2020-09-13 NOTE — Progress Notes (Deleted)
   Spoke with [employer] Rep about ICD Interrogation: - Normal device function. 3 months left to EOL.  - No atrial or ventricular arrhythmias noted. However, device not scheduled to record anything less than 214 bpm.  - 1.9% PVC burden. - Optivol reading indicative of retaining more fluid over the last couple of days.  Discussed these results with Dr. Mayford Knife.  Corrin Parker, PA-C 09/13/2020 4:09 PM   ADDENDUM: ERROR- ENTERED UNDER WRONG PATIENT. WILL DELETE NOTE.

## 2020-09-13 NOTE — Consult Note (Addendum)
Cardiology Consultation:   Patient ID: TORRELL KRUTZ MRN: [id number]; DOB: 03-03-1961  Admit date: 09/11/2020 Date of Consult: 09/13/2020  Primary Care Provider: [name], MD [facility] Cardiologist: New (Dr. Radford Pax) [facility] Electrophysiologist:  None    Patient Profile:   Larry [name] is a 59 y.o. male with a history of multiple sclerosis, obstructive sleep apnea on CPAP, peripheral neuropathy, hypertension, hyperlipidemia, type 2 diabetes, VERD, depression, tobacco abuse, and recent exposure to multiple family members with RSV who is being seen today for the evaluation of syncope and newly diagnosed LV dysfunction at the request of Dr. Algis Liming.  History of Present Illness:   [name] is a 59 year old male with the above history. No known cardiac history. He does not follow with a Cardiologist. He states he previously had a stress test 8-12 years ago for his job that was normal. He has a history of multiple sclerosis and is on Midodrine at home for orthostatic hypotension. He also has significant neck and lower back problems and has had multiple surgeries.   Patient presented to the ED via EMS on 09/11/2020 for further evaluation of shortness of breath after recent exposure to multiple family members with RSV. Also reported an episodes of syncope a few days prior to presentation. Upon EMS arrival, patient was on home CPAP machine and noted to be saturating at 88%. He was placed on non-rebreather mask. Upon arrival to the ED, he was febrile with temp of 110.7 and tachypneic. O2 sats were 91% on 6L of nasal cannula. EKG showed normal sinus rhythm with a lot of baseline artifact but inferior Q waves and non-specific ST/T changes. Chest x-ray showed cardiac enlargement but no acute findings. BNP mildly elevated at 370. WBC 8.5, Hgb 16.5, Plts 109. Na 136, K 3.8, Glucose 155, Cr 1.09. Respiratory panel negative for COVID and Influenza A/B but positive for RSV. D-dimer elevated  at 0.88. Patient was admitted for RSV bronchitis and started on IV steroid, aggressive bronchodilator therapy, and trial of IV Lasix. He did require BiPAP for his respiratory distress. Echo was ordered and showed LVEF of 40-45% with global hypokinesis slightly worse in the septum as well as mild grade 1 diastolic dysfunction. RV was normal but also had mildly reduced systolic function. Therefore, Cardiology consulted for further evaluation.   At the time of this evaluation, patient resting comfortably. Patient on 2L of nasal cannula with O2 sats of 93. He feels like he is breathing much better. Patient reports he is not very active due to being very fatigued with activity as well as some dyspnea with exertion (although fatigue more prominent). He states this has been a problem for about 1 year. He has had worsening shortness of breath, cough, and nasal congestion over the last several days. He was exposed to RSV through multiple grandchildren and step daughter. He denies any shortness of breath at rest prior to this acute illnesses. He has sleep apnea and has no problems breathing at night as long as he uses his CPAP machine. No chest pain. No lower extremity edema. [place] was being repair and patient was up for almost 2 days straight emptying buckets of rain water. He denies any shortness of breath with this activity but does not some shortness of breath and states he was completely exhausted.   During one of these trips, he states he suddenly fell to  the ground and passed out . He denies any prodromal symptoms prior to this - no chest pain, palpitations, lightheadedness, dizziness. He does not think he was unconscious for lung. He denies any bowel or bladder incontinence, tongue biting, or seizure like activity but does describe being a little confused after this (he could not remember when he came home). Patient does state he has  a history of syncope if he stands up too quickly and during significant coughing episodes. Prior to these spells though, he feels like he is going to pass out and states he feels numb. This sounds like orthostatic hypotension and vasovagal syncope.  He denies any hematochezia, melena, or hemoptysis.  Patient has a significant smoking history. He states he started smoking when he was 65, quit for 13 years, and then restarted about 8 years ago. He currently smokes about 1 pack per day. He denies any alcohol use but does report smoking about 1/2 "joint" at night to help him calm down and stop shaking.   He does have a family history of heart disease with his mother having a heart attack 6 months at the age of 53. No other known family history of heart disease.   Past Medical History:  Diagnosis Date  . Arthritis   . Nerve pain     History reviewed. No pertinent surgical history.   Home Medications:  Prior to Admission medications   Medication Sig Start Date End Date Taking? Authorizing Provider  atorvastatin (LIPITOR) 10 MG tablet Take 10 mg by mouth daily.   Yes [provider]  buPROPion (WELLBUTRIN XL) 150 MG 24 hr tablet Take 150 mg by mouth daily.   Yes [provider]  Diroximel Fumarate (VUMERITY) 231 MG CPDR Take 2 capsules by mouth in the morning and at bedtime.   Yes [provider]  DULoxetine (CYMBALTA) 60 MG capsule Take 60 mg by mouth daily. 08/07/17  Yes [provider]  glucose blood (FREESTYLE TEST STRIPS) test strip Use as instructed 08/30/17  Yes Ghimire, Henreitta Leber, MD  glucose monitoring kit (FREESTYLE) monitoring kit 1 each by Does not apply route 4 (four) times daily - after meals and at bedtime. 1 month Diabetic Testing Supplies for QAC-QHS accuchecks. 08/30/17  Yes Ghimire, Henreitta Leber, MD  Lancets (FREESTYLE) lancets Use as instructed 08/30/17  Yes Ghimire, Henreitta Leber, MD  methocarbamol (ROBAXIN) 750 MG tablet Take 750 mg by mouth 3  (three) times daily.   Yes [provider]  midodrine (PROAMATINE) 5 MG tablet Take 5 mg by mouth daily.   Yes [provider]  omeprazole (PRILOSEC) 40 MG capsule Take 40 mg by mouth daily.   Yes [provider]  pregabalin (LYRICA) 150 MG capsule Take 150 mg by mouth in the morning, at noon, and at bedtime.   Yes [provider]  zolpidem (AMBIEN) 10 MG tablet Take 10 mg by mouth at bedtime as needed for sleep.   Yes [provider]  acetaminophen (TYLENOL) 500 MG tablet Take 500 mg by mouth daily. Patient not taking: Reported on 09/12/2020    [provider]  dexamethasone (DECADRON) 4 MG tablet Take 4 mg BID for 2 days, then Take 4 mg daily for 2 days, then Take 2 mg (0.5 tablet) for 2 days and then stop Patient not taking: Reported on 09/12/2020 08/30/17   Jonetta Osgood, MD  insulin aspart (NOVOLOG FLEXPEN) 100 UNIT/ML FlexPen Inject 6 Units into the skin 3 (three) times daily with meals.  Patient not taking: Reported on 09/12/2020 08/30/17   Jonetta Osgood, MD  Insulin Glargine (LANTUS) 100 UNIT/ML Solostar Pen Inject 25 Units into the skin daily at 10 pm. Patient not taking: Reported on 09/12/2020 08/30/17   Jonetta Osgood, MD  Insulin Pen Needle 32G X 8 MM MISC Use as directed Patient not taking: Reported on 09/12/2020 08/30/17   Jonetta Osgood, MD  levothyroxine (SYNTHROID) 50 MCG tablet Take 50 mcg by mouth daily before breakfast.    [provider]  meloxicam (MOBIC) 15 MG tablet Take 15 mg by mouth daily. Patient not taking: Reported on 09/12/2020    [provider]  metFORMIN (GLUCOPHAGE) 500 MG tablet Take 1 tablet (500 mg total) by mouth 2 (two) times daily with a meal. Patient not taking: Reported on 09/12/2020 08/30/17 08/30/18  Jonetta Osgood, MD    Inpatient Medications: Scheduled Meds: . atorvastatin  10 mg Oral Daily  . buPROPion  150 mg Oral Daily  . Chlorhexidine Gluconate Cloth  6 each  Topical Daily  . Diroximel Fumarate  462 mg Oral BID  . DULoxetine  60 mg Oral Daily  . enoxaparin (LOVENOX) injection  40 mg Subcutaneous Q24H  . insulin aspart  0-15 Units Subcutaneous TID AC & HS  . levothyroxine  50 mcg Oral Q0600  . mouth rinse  15 mL Mouth Rinse BID  . methylPREDNISolone (SOLU-MEDROL) injection  40 mg Intravenous Q12H  . midodrine  5 mg Oral Daily  . pantoprazole  40 mg Oral Daily  . pregabalin  150 mg Oral TID   Continuous Infusions:  PRN Meds: acetaminophen **OR** acetaminophen, albuterol, methocarbamol, ondansetron **OR** ondansetron (ZOFRAN) IV, polyethylene glycol, zolpidem  Allergies:    Allergies  Allergen Reactions  . Aspirin     Bleeding    Social History:   Social History   Socioeconomic History  . Marital status: Single    Spouse name: Not on file  . Number of children: Not on file  . Years of education: Not on file  . Highest education level: Not on file  Occupational History  . Not on file  Tobacco Use  . Smoking status: Current Every Day Smoker    Types: Cigarettes  . Smokeless tobacco: Never Used  Substance and Sexual Activity  . Alcohol use: No  . Drug use: No  . Sexual activity: Not on file  Other Topics Concern  . Not on file  Social History Narrative  . Not on file   Social Determinants of Health   Financial Resource Strain:   . Difficulty of Paying Living Expenses: Not on file  Food Insecurity:   . Worried About Charity fundraiser in the Last Year: Not on file  . Ran Out of Food in the Last Year: Not on file  Transportation Needs:   . Lack of Transportation (Medical): Not on file  . Lack of Transportation (Non-Medical): Not on file  Physical Activity:   . Days of Exercise per Week: Not on file  . Minutes of Exercise per Session: Not on file  Stress:   . Feeling of Stress : Not on file  Social Connections:   . Frequency of Communication with Friends and Family: Not on file  . Frequency of Social Gatherings  with Friends and Family: Not on file  . Attends Religious Services: Not on file  . Active Member of Clubs or Organizations: Not on file  . Attends Archivist Meetings: Not on file  .  Marital Status: Not on file  Intimate Partner Violence:   . Fear of Current or Ex-Partner: Not on file  . Emotionally Abused: Not on file  . Physically Abused: Not on file  . Sexually Abused: Not on file    Family History:    Family History  Problem Relation Age of Onset  . Heart attack Mother 29  . Lung cancer Neg Hx      ROS:  Please see the history of present illness.  All other ROS reviewed and negative.     Physical Exam/Data:   Vitals:   09/13/20 0400 09/13/20 0500 09/13/20 0600 09/13/20 0800  BP:    (!) 148/62  Pulse: 69 64 63   Resp: 18 17 17    Temp:    98.6 F (37 C)  TempSrc:    Oral  SpO2: 96% 96% 96%   Weight:      Height:        Intake/Output Summary (Last 24 hours) at 09/13/2020 0908 Last data filed at 09/13/2020 0517 Gross per 24 hour  Intake 720 ml  Output 1000 ml  Net -280 ml   Last 3 Weights 09/12/2020 08/28/2017 08/28/2017  Weight (lbs) 215 lb 9.6 oz 215 lb 9.8 oz 240 lb  Weight (kg) 97.796 kg 97.8 kg 108.863 kg     Body mass index is 31.84 kg/m.  General: 59 y.o. male resting comfortably in no acute distress. HEENT: Normocephalic and atraumatic. Sclera clear.  Neck: Supple. No carotid bruits. No JVD. Heart: RRR. Distinct S1 and S2. No murmurs, gallops, or rubs. Radial pulses 2+ and equal bilaterally. Lungs: No increased work of breathing. Faint expiratory wheezes heart throughout. No crackles or rhonchi. Abdomen: Soft, non-distended, and non-tender to palpation. Bowel sounds present. Extremities: No lower extremity edema.    Skin: Warm and dry. Neuro: Alert and oriented x3. No focal deficits. Psych: Normal affect. Responds appropriately.    EKG:  The EKG was personally reviewed and demonstrates: Normal sinus rhythm with a lot of baseline artifact but  inferior Q waves and non-specific ST/T changes. Telemetry:  Telemetry was personally reviewed and demonstrates:  Normal sinus rhythm with rates in the 70's.  Relevant CV Studies:  Echocardiogram 09/12/2020: Impressions: 1. Poor image quality even with administration of Definity contrast.  There appears to be global hypokinesis slightly worse in the septum.. Left  ventricular ejection fraction, by estimation, is 40 to 45%. The left  ventricle has mildly decreased function.  The left ventricle demonstrates regional wall motion abnormalities (see  scoring diagram/findings for description). There is mild concentric left  ventricular hypertrophy. Left ventricular diastolic parameters are  consistent with Grade I diastolic  dysfunction (impaired relaxation).  2. Right ventricular systolic function is mildly reduced. The right  ventricular size is normal.  3. The mitral valve is normal in structure. No evidence of mitral valve  regurgitation. No evidence of mitral stenosis.  4. The aortic valve is normal in structure. Aortic valve regurgitation is  not visualized. No aortic stenosis is present.  5. The inferior vena cava is normal in size with greater than 50%  respiratory variability, suggesting right atrial pressure of 3 mmHg.   Laboratory Data:  High Sensitivity Troponin:   Recent Labs  Lab 09/12/20 0546  TROPONINIHS 4     Chemistry Recent Labs  Lab 09/12/20 0015 09/13/20 0320  NA 136 140  K 3.8 4.6  CL 102 102  CO2 24 27  GLUCOSE 155* 204*  BUN 8 23*  CREATININE  1.09 1.10  CALCIUM 8.2* 9.1  GFRNONAA >60 >60  GFRAA >60 >60  ANIONGAP 10 11    Recent Labs  Lab 09/13/20 0320  PROT 7.4  ALBUMIN 4.2  AST 29  ALT 48*  ALKPHOS 68  BILITOT 0.4   Hematology Recent Labs  Lab 09/12/20 0015 09/13/20 0320  WBC 8.5 13.0*  RBC 5.55 5.22  HGB 16.5 15.8  HCT 50.1 47.4  MCV 90.3 90.8  MCH 29.7 30.3  MCHC 32.9 33.3  RDW 14.2 13.9  PLT 109* 126*   BNP Recent  Labs  Lab 09/12/20 0015  BNP 370.0*    DDimer  Recent Labs  Lab 09/12/20 0546  DDIMER 0.88*     Radiology/Studies:  DG Chest Portable 1 View  Result Date: 09/12/2020 CLINICAL DATA:  Shortness of breath. No known COVID exposure. Current smoker. EXAM: PORTABLE CHEST 1 VIEW COMPARISON:  None. FINDINGS: Shallow inspiration. Cardiac enlargement. No vascular congestion or edema. No focal consolidation or airspace disease. No pleural effusions. No pneumothorax. Mediastinal contours appear intact. Postoperative changes in the cervical spine. IMPRESSION: Cardiac enlargement. No evidence of active pulmonary disease. Electronically Signed   By: Lucienne Capers M.D.   On: 09/12/2020 00:18   ECHOCARDIOGRAM COMPLETE  Result Date: 09/12/2020    ECHOCARDIOGRAM REPORT   Patient Name:   KALUB MORILLO [facility] Date of Exam: 09/12/2020 Medical Rec #:  [id number]      Height:       69.0 in Accession #:    [id number]     Weight:       215.6 lb Date of Birth:  [birth year]      BSA:          2.133 m Patient Age:    22 years       BP:           124/78 mmHg Patient Gender: M              HR:           79 bpm. Exam Location:  Inpatient Procedure: 2D Echo, Cardiac Doppler, Color Doppler and Intracardiac            Opacification Agent Indications:    I50.9* Heart failure (unspecified)  History:        Patient has no prior history of Echocardiogram examinations.                 Signs/Symptoms:Dyspnea and Shortness of Breath; Risk                 Factors:Sleep Apnea and Diabetes. Multiple sclerosis. Hypoxia.                 RSV.  Sonographer:    Roseanna Rainbow RDCS Referring Phys: [id number] [place]  Sonographer Comments: Technically difficult study due to poor echo windows, Technically challenging study due to limited acoustic windows, suboptimal parasternal window, suboptimal apical window and suboptimal subcostal window. Image acquisition challenging due to patient body habitus and Image acquisition challenging due to respiratory  motion. Study is nearly non- diagnostic due to respiratory motion and poor windows. Patient could not turn or bend left arm. Extremely difficult study. IMPRESSIONS  1. Poor image quality even with administration of Definity contrast. There appears to be global hypokinesis slightly worse in the septum.. Left ventricular ejection fraction, by estimation, is 40 to 45%. The left ventricle has mildly decreased function.  The left ventricle demonstrates regional wall motion abnormalities (see scoring diagram/findings for description). There  is mild concentric left ventricular hypertrophy. Left ventricular diastolic parameters are consistent with Grade I diastolic dysfunction (impaired relaxation).  2. Right ventricular systolic function is mildly reduced. The right ventricular size is normal.  3. The mitral valve is normal in structure. No evidence of mitral valve regurgitation. No evidence of mitral stenosis.  4. The aortic valve is normal in structure. Aortic valve regurgitation is not visualized. No aortic stenosis is present.  5. The inferior vena cava is normal in size with greater than 50% respiratory variability, suggesting right atrial pressure of 3 mmHg. FINDINGS  Left Ventricle: Poor image quality even with administration of Definity contrast. There appears to be global hypokinesis slightly worse in the septum. Left ventricular ejection fraction, by estimation, is 40 to 45%. The left ventricle has mildly decreased function. The left ventricle demonstrates regional wall motion abnormalities. Definity contrast agent was given IV to delineate the left ventricular endocardial borders. The left ventricular internal cavity size was normal in size. There is mild concentric left ventricular hypertrophy. Left ventricular diastolic parameters are consistent with Grade I diastolic dysfunction (impaired relaxation). Normal left ventricular filling pressure. Right Ventricle: The right ventricular size is normal. No increase  in right ventricular wall thickness. Right ventricular systolic function is mildly reduced. Left Atrium: Left atrial size was normal in size. Right Atrium: Right atrial size was normal in size. Pericardium: There is no evidence of pericardial effusion. Mitral Valve: The mitral valve is normal in structure. No evidence of mitral valve regurgitation. No evidence of mitral valve stenosis. Tricuspid Valve: The tricuspid valve is normal in structure. Tricuspid valve regurgitation is trivial. No evidence of tricuspid stenosis. Aortic Valve: The aortic valve is normal in structure. Aortic valve regurgitation is not visualized. No aortic stenosis is present. Pulmonic Valve: The pulmonic valve was normal in structure. Pulmonic valve regurgitation is not visualized. No evidence of pulmonic stenosis. Aorta: The aortic root is normal in size and structure. Venous: The inferior vena cava is normal in size with greater than 50% respiratory variability, suggesting right atrial pressure of 3 mmHg. IAS/Shunts: No atrial level shunt detected by color flow Doppler.  LEFT VENTRICLE PLAX 2D LVIDd:         5.57 cm  Diastology LVIDs:         4.90 cm  LV e' medial:    5.11 cm/s LV PW:         1.15 cm  LV E/e' medial:  8.2 LV IVS:        1.15 cm  LV e' lateral:   8.38 cm/s LVOT diam:     2.25 cm  LV E/e' lateral: 5.0 LV SV:         63 LV SV Index:   29 LVOT Area:     3.98 cm  RIGHT VENTRICLE RV S prime:     11.00 cm/s TAPSE (M-mode): 1.4 cm LEFT ATRIUM             Index       RIGHT ATRIUM           Index LA diam:        3.90 cm 1.83 cm/m  RA Area:     10.40 cm LA Vol (A2C):   29.7 ml 13.92 ml/m RA Volume:   17.90 ml  8.39 ml/m LA Vol (A4C):   13.1 ml 6.14 ml/m LA Biplane Vol: 18.5 ml 8.67 ml/m  AORTIC VALVE LVOT Vmax:   88.80 cm/s LVOT Vmean:  64.700 cm/s LVOT VTI:  0.158 m  AORTA Ao Root diam: 3.90 cm MITRAL VALVE               TRICUSPID VALVE MV Area (PHT): 3.21 cm    TR Peak grad:   7.7 mmHg MV Decel Time: 236 msec    TR Vmax:         139.00 cm/s MV E velocity: 42.00 cm/s MV A velocity: 68.10 cm/s  SHUNTS MV E/A ratio:  0.62        Systemic VTI:  0.16 m                            Systemic Diam: 2.25 cm Skeet Latch MD Electronically signed by Skeet Latch MD Signature Date/Time: 09/12/2020/11:50:50 AM    Final     Assessment and Plan:   Syncope - Patient has a history of recurrent syncope with standing too quickly and coughing episodes. This sounds like this is secondary to orthostatic hypotension and vasovagal syncope. Patient does have a history of multiple sclerosis and is on Midodrine at home. However, he also reports a syncopal episodes last week with no prodroma symptoms that sounds different than other episodes. - No arrhythmias note don telemetry. - No acute ischemic changes on EKG. - Echo showed  LVEF of 40-45% with global hypokinesis slightly worse in the septum as well as mild grade 1 diastolic dysfunction. RV was normal but also had mildly reduced systolic function. - D-dimer elevated. Recommend chest CTA to rule out PE.  - Continue to monitor on telemetry. Likely will need outpatient monitor for further evaluation. Will discuss driving restrictions with MD.  New Cardiomyopathy with Possible Mild Acute Systolic CHF - BNP mildly elevated in the 300's.  - Chest x-ray showed cardiac enlargement but no edema.  - Echo as above. - Received 1 dose of IV Lasix with bump in BUN but stable creatinine.  - Patient appears euvolemic on exam. - No additional Lasix needed at this time. - Would ideally be on ARB and beta-blocker but given history of orthostatic hypotension with recurrent syncope, will discuss with MD. - Patient denies any chest pain but does have multiple CV risk factors including HTN, HLD, DM, and tobacco abuse. He also reports fatigue and dyspnea with activity. I think he would benefit from ischemic evaluation but this can likely be done as outpatient once patient recovers from RSV. Will discuss  with MD.  Acute Hypoxic Respiratory Failure Secondary to RSV Bronchitis - Respiratory panel negative for COVID and Influenza but positive for RSV. - Required BiPAP. Now on nasal cannula 2L. - Continue breathing treatment and steroids. - Management per primary team.  Hypertension  Orthostatic Hypotension - History of essential hypertension but also has orthostatic hypotension and is on Midodrine at home. BP mildly elevated at times.  - Consider adding low dose ARB or beta-blocker given reduced EF. Will discuss with MD given history of orthostatic hypotension on Midodrine.  Hyperlipidemia - Continue Lipitor 52m daily.  Diabetes Mellitus - Management per primary team.  Multiple Sclerosis - Management per primary team.  Tobacco Abuse Marijuana Abuse - Patient has a long smoking history. Currently smokes 1 pack per day. Also reports smoking 1/2 joint every night.  - Will need to continue to discuss importance of complete cessation.  Otherwise, per primary team.  For questions or updates, please contact CPurvisPlease consult [email or web address] for contact info under    Signed, CDarreld Mclean PA-C  09/13/2020 9:08 AM

## 2020-09-13 NOTE — Progress Notes (Signed)
PROGRESS NOTE   Larry [name]  FKC:[id number]    DOB: 10-04-1961    DOA: 09/11/2020  PCP: Roberts Gaudy., MD   I have briefly reviewed patients previous medical records in [facility].  Chief Complaint  Patient presents with  . Shortness of Breath    Brief Narrative:  59 year old male with PMH of type II DM, no longer on treatment, OSA on nightly CPAP, GERD, peripheral neuropathy, depression, hyperlipidemia, multiple sclerosis, orthostatic hypotension on midodrine tobacco abuse, recent exposure to multiple family members with RSV infection, presented to the ED via EMS due to mostly dry cough, worsening dyspnea and wheezing of 4 days duration.  EMS found him with acute hypoxic respiratory failure while on home CPAP.  He was transitioned to NRB mask and transported to ED.  S/p BiPAP for several hours, IV Lasix, IV Solu-Medrol.  Improved.  Weaned off oxygen during the course of the day 9/27, likely due to desaturation evaluation.  Cardiology consulted, initiating new ARB and recommended monitoring overnight for orthostatic changes given prior history of same for which she is on midodrine as well.    Assessment & Plan:  Acute respiratory failure with hypoxia (HCC)  As per daughter who is nursing tech at the [facility] ED, home oxygen saturation was 87% on CPAP prior to EMS arrival.  On initial arrival to the ED he was noted to have significant expiratory wheezing with multiple family members noted to be positive for RSV in the past several days.  Patient has tested positive for RSV upon arrival here at [facility] emergency department. Patient is Covid negative.  Chest x-ray does not reveal any definitive infiltrate  D-dimer is only minimally elevated and patient is less likely to be suffering from an acute pulmonary embolism.  No history of recent long distance travel.  However given recent syncope, after discussion with cardiology, obtained CTA chest to rule out PE and it  was negative for PE.  Most likely, patient is suffering from an RSV acute bronchitis in a patient with multiple sclerosis and immunocompromise. Alternatively, patient may be suffering from a COPD exacerbation considering his longstanding history from smoking -patient does not carry a diagnosis of COPD.  Although BNP is elevated, low index of suspicion for decompensated CHF.  Treated with IV Solu-Medrol, bronchodilator nebulizations, aggressive pulmonary toilet and supportive care.  Discontinued Lasix.  Able to wean off oxygen today.  Was on CPAP last night.  Discontinued IV Solu-Medrol.  Ordered low-dose prednisone 40 mg daily to begin tomorrow but can be reassessed and considered completely stopping if no clinical features of bronchospasm.  RSV infection  May have contracted from multiple family members who have RSV.  Supportive care.  Improved.  Multiple sclerosis (HCC)  Continue home regimen of Vumerity  Follows with [facility]rology at [facility]  Major depressive disorder  Continue prior home regimen.  Peripheral polyneuropathy  Continue Lyrica.  Type 2 diabetes mellitus with complication, without long-term current use of insulin (HCC)  Patient was diagnosed and hospitalized with diabetes mellitus type 2 with diabetic ketoacidosis in 2018  Patient states that he no longer has diabetes and that he is no longer on therapy.  Since he was initiated the patient on steroid therapy, monitor CBGs and placed on SSI  A1c 6.1 suggests good outpatient control.  Hyperglycemia here pertaining to steroids, should improve after cessation of steroids.  Mixed hyperlipidemia  Continue home statin therapy.  GERD without esophagitis  Continue PPI.  Elevated brain natriuretic  peptide (BNP) level  BNP 370 on admission.  Clinically however does not appear overtly volume overloaded.  Troponin negative.  TTE: Poor image quality.  LVEF 40 to 45%.  Global hypokinesis.  Grade 1 diastolic  dysfunction.  Tobacco abuse  Smokes up to a pack per day for several years.  Cessation counseled.  Patient declines nicotine patch.  Syncope and collapse  Reportedly occurred 4 to 5 days P[place]  This was reported by patient's daughter who indicated that after he drove back from the mountains, he has no recollection of how he got back home.  Patient states that his roof was leaking and for 2 days in a row he was emptying buckets of green water and got very little sleep.  No arrhythmias noted on telemetry.  TTE results as noted below.  Patient should not drive for 6 months, this was counseled to today with RN at bedside and his spouse on speaker phone.  He verbalized understanding.  Cardiology was consulted and suspect that the syncopal episode 4 to 5 days prior to admission may have been orthostatic from dehydration in the setting of poor oral intake and RSV infection.  She has history of orthostatic hypotension requiring midodrine in the past.  He also has history of vasovagal syncope with coughing.  Cardiology will arrange outpatient 30-day event monitor and coronary CTA.  Thrombocytopenia (HCC)  Appears to be chronic.  Periodically follow CBC.  Cardiomyopathy (HCC)  TTE 9/26: LVEF 40-45% with global hypokinesis.  Cardiology consulted.  They suspect that his mild biventricular failure could be related to viral cardiomyopathy as he apparently had a normal cardiac work-up with stress test over 10 years ago for work.  However they indicate that his chronic DOE and exertional fatigue and with several coronary risk factors, he should have some form of ischemic work-up and they plan to perform coronary CTA as an outpatient to assess for CAD.  Cardiology plans trial of low-dose ARB rather than a beta-blocker for his cardiomyopathy and would like to start this and monitor him overnight given history of orthostatic hypotension for which he is on  midodrine.  This can be further titrated as outpatient.  Also need to consider if his midodrine which is a small dose can be stopped as outpatient.  CTA Chest shows CAD   DVT prophylaxis: enoxaparin (LOVENOX) injection 40 mg Start: 09/12/20 1000     Code Status: Full Code  Family Communication: Discussed in detail with patient's spouse via his speaker phone at bedside.  Updated care and answered all questions. Disposition:  Status is: Inpatient   Dispo: The patient is from: Home              Anticipated d/c is to: Home              Anticipated d/c date is: 1 day              Patient currently is not medically stable to d/c.        Consultants:   Cardiology  Procedures:   None  Antimicrobials:    Anti-infectives (From admission, onward)   None        Subjective:  Patient seen this morning along with patient's RN and cardiology team at bedside.  Patient's wife was on patient's speaker phone.  Patient reports feeling much better.  Dyspnea and chest congestion have significantly improved likely been resolved.  No chest pain reported.  States that  on Tuesday of last week, while he was working in his [facility] which had a roof leak, he had to constantly empty several pockets of water over the course of 2 days and did not get enough sleep.  He reports passing out for a few seconds without premonitory symptoms and then woke right back up.  His wife stated that he has about a 1 year history of passing out briefly after violent coughing spells.  Objective:   Vitals:   09/13/20 0600 09/13/20 0800 09/13/20 1000 09/13/20 1200  BP:  (!) 148/62  131/67  Pulse: 63  65 71  Resp: 17  (!) 28 14  Temp:  98.6 F (37 C)  98.4 F (36.9 C)  TempSrc:  Oral  Oral  SpO2: 96%  94% 90%  Weight:      Height:        General exam: Pleasant young male, moderately built and obese lying comfortably propped up in bed without distress.  Looks much improved compared to yesterday. Respiratory  system: Much improved breath sounds.  Slightly harsh posteriorly but otherwise clear to auscultation without wheezing, rhonchi or crackles.  No increased work of breathing. Cardiovascular system: S1 & S2 heard, RRR. No JVD, murmurs, rubs, gallops or clicks. No pedal edema.  Telemetry personally reviewed: Sinus rhythm with BBB morphology. Gastrointestinal system: Abdomen is nondistended, soft and nontender. No organomegaly or masses felt. Normal bowel sounds heard. Central nervous system: Alert and oriented. No focal neurological deficits. Extremities: Symmetric 5 x 5 power. Skin: No rashes, lesions or ulcers Psychiatry: Judgement and insight appear normal. Mood & affect appropriate.     Data Reviewed:   I have personally reviewed following labs and imaging studies   CBC: Recent Labs  Lab 09/12/20 0015 09/13/20 0320  WBC 8.5 13.0*  NEUTROABS 7.0 11.4*  HGB 16.5 15.8  HCT 50.1 47.4  MCV 90.3 90.8  PLT 109* 126*    Basic Metabolic Panel: Recent Labs  Lab 09/12/20 0015 09/13/20 0320  NA 136 140  K 3.8 4.6  CL 102 102  CO2 24 27  GLUCOSE 155* 204*  BUN 8 23*  CREATININE 1.09 1.10  CALCIUM 8.2* 9.1  MG  --  2.4    Liver Function Tests: Recent Labs  Lab 09/13/20 0320  AST 29  ALT 48*  ALKPHOS 68  BILITOT 0.4  PROT 7.4  ALBUMIN 4.2    CBG: Recent Labs  Lab 09/13/20 0754 09/13/20 1212 09/13/20 1605  GLUCAP 164* 222* 198*    Microbiology Studies:   Recent Results (from the past 240 hour(s))  Resp Panel by RT PCR (RSV, Flu A&B, Covid) - Nasopharyngeal Swab     Status: Abnormal   Collection Time: 09/12/20 12:15 AM   Specimen: Nasopharyngeal Swab  Result Value Ref Range Status   SARS Coronavirus 2 by RT PCR NEGATIVE NEGATIVE Final    Comment: (NOTE) SARS-CoV-2 target nucleic acids are NOT DETECTED.  The SARS-CoV-2 RNA is generally detectable in upper respiratoy specimens during the acute phase of infection. The lowest concentration of SARS-CoV-2 viral  copies this assay can detect is 131 copies/mL. A negative result does not preclude SARS-Cov-2 infection and should not be used as the sole basis for treatment or other patient management decisions. A negative result may occur with  improper specimen collection/handling, submission of specimen other than nasopharyngeal swab, presence of viral mutation(s) within the areas targeted by this assay, and inadequate number of viral copies (<131 copies/mL). A negative result must  be combined with clinical observations, patient history, and epidemiological information. The expected result is Negative.  Fact Sheet for Patients:  https://www.moore.com/  Fact Sheet for Healthcare Providers:  https://www.young.biz/  This test is no t yet approved or cleared by the [place] FDA and  has been authorized for detection and/or diagnosis of SARS-CoV-2 by FDA under an Emergency Use Authorization (EUA). This EUA will remain  in effect (meaning this test can be used) for the duration of the COVID-19 declaration under Section 564(b)(1) of the Act, 21 U.S.C. section 360bbb-3(b)(1), unless the authorization is terminated or revoked sooner.     Influenza A by PCR NEGATIVE NEGATIVE Final   Influenza B by PCR NEGATIVE NEGATIVE Final    Comment: (NOTE) The Xpert Xpress SARS-CoV-2/FLU/RSV assay is intended as an aid in  the diagnosis of influenza from Nasopharyngeal swab specimens and  should not be used as a sole basis for treatment. Nasal washings and  aspirates are unacceptable for Xpert Xpress SARS-CoV-2/FLU/RSV  testing.  Fact Sheet for Patients: https://www.moore.com/  Fact Sheet for Healthcare Providers: https://www.young.biz/  This test is not yet approved or cleared by the [place] FDA and  has been authorized for detection and/or diagnosis of SARS-CoV-2 by  FDA under an Emergency Use Authorization (EUA). This  EUA will remain  in effect (meaning this test can be used) for the duration of the  Covid-19 declaration under Section 564(b)(1) of the Act, 21  U.S.C. section 360bbb-3(b)(1), unless the authorization is  terminated or revoked.    Respiratory Syncytial Virus by PCR POSITIVE (A) NEGATIVE Final    Comment: (NOTE) Fact Sheet for Patients: https://www.moore.com/  Fact Sheet for Healthcare Providers: https://www.young.biz/  This test is not yet approved or cleared by the [place] FDA and  has been authorized for detection and/or diagnosis of SARS-CoV-2 by  FDA under an Emergency Use Authorization (EUA). This EUA will remain  in effect (meaning this test can be used) for the duration of the  COVID-19 declaration under Section 564(b)(1) of the Act, 21 U.S.C.  section 360bbb-3(b)(1), unless the authorization is terminated or  revoked. Performed at [facility], [address] [place], [place] [address]   Blood culture (routine x 2)     Status: None (Preliminary result)   Collection Time: 09/12/20 12:15 AM   Specimen: BLOOD  Result Value Ref Range Status   Specimen Description   Final    BLOOD BLOOD RIGHT HAND Performed at [facility], [address] [place], [place] [address]    Special Requests   Final    BOTTLES DRAWN AEROBIC AND ANAEROBIC Blood Culture results may not be optimal due to an inadequate volume of blood received in culture bottles Performed at [facility], [address], [place], [place] [address]    Culture   Final    NO GROWTH 1 DAY Performed at [facility], [address], [place], [place] [address]    Report Status PENDING  Incomplete  Blood culture (routine x 2)     Status: None (Preliminary result)   Collection Time: 09/12/20 12:15 AM   Specimen: BLOOD  Result Value Ref Range Status   Specimen Description   Final    BLOOD RIGHT  ANTECUBITAL Performed at [facility], [address], [place], [place] [address]    Special Requests   Final    BOTTLES DRAWN AEROBIC AND ANAEROBIC Blood Culture adequate volume Performed at [facility], [address] [place], [place] [address]    Culture   Final    NO GROWTH 1 DAY Performed at [facility], [address] [place], [place] [address]    Report Status PENDING  Incomplete  MRSA PCR Screening     Status: None   Collection Time: 09/12/20  1:36 PM   Specimen: Nasal Mucosa; Nasopharyngeal  Result Value Ref Range Status   MRSA by PCR NEGATIVE NEGATIVE Final    Comment:        The GeneXpert MRSA Assay (FDA approved for NASAL specimens only), is one component of a comprehensive MRSA colonization surveillance program. It is not intended to diagnose MRSA infection nor to guide or monitor treatment for MRSA infections. Performed at [facility], [address], [place], [place] [address]      Radiology Studies:  CT ANGIO CHEST PE W OR WO CONTRAST  Result Date: 09/13/2020 CLINICAL DATA:  Syncope, elevated D-dimer EXAM: CT ANGIOGRAPHY CHEST WITH CONTRAST TECHNIQUE: Multidetector CT imaging of the chest was performed using the standard protocol during bolus administration of intravenous contrast. Multiplanar CT image reconstructions and MIPs were obtained to evaluate the vascular anatomy. CONTRAST:  OMNIPAQUE IOHEXOL 350 MG/ML SOLN COMPARISON:  Chest x-ray 09/12/2020 FINDINGS: Cardiovascular: No filling defects in the pulmonary arteries to suggest pulmonary emboli. Heart is normal size. Scattered coronary artery calcifications. Aorta normal caliber. Mediastinum/Nodes: No mediastinal, hilar, or axillary adenopathy. Trachea and esophagus are unremarkable. Thyroid unremarkable. Lungs/Pleura: Lungs are clear. No focal airspace opacities or suspicious nodules. No effusions. Upper Abdomen: Diffuse fatty  infiltration of the liver. Musculoskeletal: Chest wall soft tissues are unremarkable. No acute bony abnormality. Review of the MIP images confirms the above findings. IMPRESSION: No evidence of pulmonary embolus. Coronary artery disease. Diffuse fatty infiltration of the liver. No acute cardiopulmonary disease. Electronically Signed   By: Charlett Nose M.D.   On: 09/13/2020 16:15   DG Chest Portable 1 View  Result Date: 09/12/2020 CLINICAL DATA:  Shortness of breath. No known COVID exposure. Current smoker. EXAM: PORTABLE CHEST 1 VIEW COMPARISON:  None. FINDINGS: Shallow inspiration. Cardiac enlargement. No vascular congestion or edema. No focal consolidation or airspace disease. No pleural effusions. No pneumothorax. Mediastinal contours appear intact. Postoperative changes in the cervical spine. IMPRESSION: Cardiac enlargement. No evidence of active pulmonary disease. Electronically Signed   By: Burman Nieves M.D.   On: 09/12/2020 00:18   ECHOCARDIOGRAM COMPLETE  Result Date: 09/12/2020    ECHOCARDIOGRAM REPORT   Patient Name:   Larry [name] [facility] Date of Exam: 09/12/2020 Medical Rec #:  [id number]      Height:       69.0 in Accession #:    [id number]     Weight:       215.6 lb Date of Birth:  1961-01-16      BSA:          2.133 m Patient Age:    58 years       BP:           124/78 mmHg Patient Gender: M              HR:           79 bpm. Exam Location:  Inpatient Procedure: 2D Echo, Cardiac Doppler, Color Doppler and Intracardiac            Opacification Agent Indications:    I50.9* Heart failure (unspecified)  History:  Patient has no prior history of Echocardiogram examinations.                 Signs/Symptoms:Dyspnea and Shortness of Breath; Risk                 Factors:Sleep Apnea and Diabetes. Multiple sclerosis. Hypoxia.                 RSV.  Sonographer:    Sheralyn Boatman RDCS Referring Phys: [id number] Deno Lunger [facility]  Sonographer Comments: Technically difficult study due to poor echo windows,  Technically challenging study due to limited acoustic windows, suboptimal parasternal window, suboptimal apical window and suboptimal subcostal window. Image acquisition challenging due to patient body habitus and Image acquisition challenging due to respiratory motion. Study is nearly non- diagnostic due to respiratory motion and poor windows. Patient could not turn or bend left arm. Extremely difficult study. IMPRESSIONS  1. Poor image quality even with administration of Definity contrast. There appears to be global hypokinesis slightly worse in the septum.. Left ventricular ejection fraction, by estimation, is 40 to 45%. The left ventricle has mildly decreased function.  The left ventricle demonstrates regional wall motion abnormalities (see scoring diagram/findings for description). There is mild concentric left ventricular hypertrophy. Left ventricular diastolic parameters are consistent with Grade I diastolic dysfunction (impaired relaxation).  2. Right ventricular systolic function is mildly reduced. The right ventricular size is normal.  3. The mitral valve is normal in structure. No evidence of mitral valve regurgitation. No evidence of mitral stenosis.  4. The aortic valve is normal in structure. Aortic valve regurgitation is not visualized. No aortic stenosis is present.  5. The inferior vena cava is normal in size with greater than 50% respiratory variability, suggesting right atrial pressure of 3 mmHg. FINDINGS  Left Ventricle: Poor image quality even with administration of Definity contrast. There appears to be global hypokinesis slightly worse in the septum. Left ventricular ejection fraction, by estimation, is 40 to 45%. The left ventricle has mildly decreased function. The left ventricle demonstrates regional wall motion abnormalities. Definity contrast agent was given IV to delineate the left ventricular endocardial borders. The left ventricular internal cavity size was normal in size. There is  mild concentric left ventricular hypertrophy. Left ventricular diastolic parameters are consistent with Grade I diastolic dysfunction (impaired relaxation). Normal left ventricular filling pressure. Right Ventricle: The right ventricular size is normal. No increase in right ventricular wall thickness. Right ventricular systolic function is mildly reduced. Left Atrium: Left atrial size was normal in size. Right Atrium: Right atrial size was normal in size. Pericardium: There is no evidence of pericardial effusion. Mitral Valve: The mitral valve is normal in structure. No evidence of mitral valve regurgitation. No evidence of mitral valve stenosis. Tricuspid Valve: The tricuspid valve is normal in structure. Tricuspid valve regurgitation is trivial. No evidence of tricuspid stenosis. Aortic Valve: The aortic valve is normal in structure. Aortic valve regurgitation is not visualized. No aortic stenosis is present. Pulmonic Valve: The pulmonic valve was normal in structure. Pulmonic valve regurgitation is not visualized. No evidence of pulmonic stenosis. Aorta: The aortic root is normal in size and structure. Venous: The inferior vena cava is normal in size with greater than 50% respiratory variability, suggesting right atrial pressure of 3 mmHg. IAS/Shunts: No atrial level shunt detected by color flow Doppler.  LEFT VENTRICLE PLAX 2D LVIDd:         5.57 cm  Diastology LVIDs:  4.90 cm  LV e' medial:    5.11 cm/s LV PW:         1.15 cm  LV E/e' medial:  8.2 LV IVS:        1.15 cm  LV e' lateral:   8.38 cm/s LVOT diam:     2.25 cm  LV E/e' lateral: 5.0 LV SV:         63 LV SV Index:   29 LVOT Area:     3.98 cm  RIGHT VENTRICLE RV S prime:     11.00 cm/s TAPSE (M-mode): 1.4 cm LEFT ATRIUM             Index       RIGHT ATRIUM           Index LA diam:        3.90 cm 1.83 cm/m  RA Area:     10.40 cm LA Vol (A2C):   29.7 ml 13.92 ml/m RA Volume:   17.90 ml  8.39 ml/m LA Vol (A4C):   13.1 ml 6.14 ml/m LA Biplane  Vol: 18.5 ml 8.67 ml/m  AORTIC VALVE LVOT Vmax:   88.80 cm/s LVOT Vmean:  64.700 cm/s LVOT VTI:    0.158 m  AORTA Ao Root diam: 3.90 cm MITRAL VALVE               TRICUSPID VALVE MV Area (PHT): 3.21 cm    TR Peak grad:   7.7 mmHg MV Decel Time: 236 msec    TR Vmax:        139.00 cm/s MV E velocity: 42.00 cm/s MV A velocity: 68.10 cm/s  SHUNTS MV E/A ratio:  0.62        Systemic VTI:  0.16 m                            Systemic Diam: 2.25 cm Chilton Si MD Electronically signed by Chilton Si MD Signature Date/Time: 09/12/2020/11:50:50 AM    Final      Scheduled Meds:   . atorvastatin  10 mg Oral Daily  . buPROPion  150 mg Oral Daily  . Chlorhexidine Gluconate Cloth  6 each Topical Daily  . Diroximel Fumarate  462 mg Oral BID  . DULoxetine  60 mg Oral Daily  . enoxaparin (LOVENOX) injection  40 mg Subcutaneous Q24H  . insulin aspart  0-15 Units Subcutaneous TID AC & HS  . levothyroxine  50 mcg Oral Q0600  . mouth rinse  15 mL Mouth Rinse BID  . methylPREDNISolone (SOLU-MEDROL) injection  40 mg Intravenous Q12H  . midodrine  5 mg Oral Daily  . pantoprazole  40 mg Oral Daily  . pregabalin  150 mg Oral TID    Continuous Infusions:     LOS: 0 days     Marcellus Scott, MD, [place], [facility]. [facility]    To contact the attending provider between 7A-7P or the covering provider during after hours 7P-7A, please log into the web site [email or web address] and access using universal [place] password for that web site. If you do not have the password, please call the hospital operator.  09/13/2020, 4:30 PM

## 2020-09-14 ENCOUNTER — Telehealth: Payer: Self-pay | Admitting: *Deleted

## 2020-09-14 ENCOUNTER — Encounter: Payer: Self-pay | Admitting: *Deleted

## 2020-09-14 LAB — GLUCOSE, CAPILLARY
Glucose-Capillary: 122 mg/dL — ABNORMAL HIGH (ref 70–99)
Glucose-Capillary: 218 mg/dL — ABNORMAL HIGH (ref 70–99)

## 2020-09-14 MED ORDER — ALBUTEROL SULFATE HFA 108 (90 BASE) MCG/ACT IN AERS: 2.0000 | INHALATION_SPRAY | Freq: Four times a day (QID) | RESPIRATORY_TRACT | 2 refills | Status: AC | PRN

## 2020-09-14 MED ORDER — LOSARTAN POTASSIUM 25 MG PO TABS
12.5000 mg | ORAL_TABLET | Freq: Every day | ORAL | 3 refills | Status: DC
Start: 2020-09-15 — End: 2022-01-13

## 2020-09-14 NOTE — Telephone Encounter (Signed)
No answer, voicemail not set up. 

## 2020-09-14 NOTE — Progress Notes (Signed)
Pt discharged. PIVs removed and AVS went over with patient. He stated he had no questions. Pt was wheeled down to front.

## 2020-09-14 NOTE — Progress Notes (Signed)
PT Cancellation Note / Screen  Patient Details Name: Larry [name] MRN: [id number] DOB: 07-23-1961   Cancelled Treatment:    Reason Eval/Treat Not Completed: PT screened, no needs identified, will sign off  RN has ambulated pt and reports no PT needs at this time. Pt also to discharge today.   Sicily Zaragoza,KATHrine E 09/14/2020, 12:14 PM Paulino Door, DPT Acute Rehabilitation Services Pager: [phone] Office: [phone]

## 2020-09-14 NOTE — Progress Notes (Signed)
Pt ambulated half a lap around the unit on room air and sats maintained above 88%. His sats dipped to 88 two times but popped back up to 94% quickly.

## 2020-09-15 NOTE — Discharge Summary (Addendum)
Physician Discharge Summary  Dean P Burdett MRN:[id number] DOB: 09/22/1961 DOA: 09/11/2020  PCP: Burns, Kevin L., MD  Admit date: 09/11/2020 Discharge date: 09/15/2020  Admitted From: home Disposition:  home Discharging physician: David Girguis, MD  Recommendations for Outpatient Follow-up:  1. Wean off O2 as able 2. Follow up with cardiology (started on ARB prior to d/c)   Equipment/Devices: Home O2  Patient discharged to home in Discharge Condition: stable CODE STATUS: Full Diet recommendation:  Diet Orders (From admission, onward)    Start     Ordered   09/14/20 0000  Diet Carb Modified        09/14/20 1108          Hospital Course: 59-year-old male with PMH of type II DM, no longer on treatment, OSA on nightly CPAP, GERD, peripheral neuropathy, depression, hyperlipidemia, multiple sclerosis, orthostatic hypotension on midodrine tobacco abuse, recent exposure to multiple family members with RSV infection, presented to the ED via EMS due to mostly dry cough, worsening dyspnea and wheezing of 4 days duration.  EMS found him with acute hypoxic respiratory failure while on home CPAP.  He was transitioned to NRB mask and transported to ED.  S/p BiPAP for several hours, IV Lasix, IV Solu-Medrol.  Improved.  Weaned off oxygen during the course of the day 9/27, likely due to desaturation evaluation.    Nasal swab was positive for RSV well with the patient. He was treated supportively. He ambulated prior to discharge and did require O2 at time of discharge which was arranged.   Cardiology consulted, initiating new ARB and recommended monitoring overnight for orthostatic changes given prior history of same for which he is on midodrine as well. He will follow-up further with cardiology outpatient for ongoing CAD work-up.     Acute respiratory failure with hypoxia (HCC)  As per daughter who is nursing tech at the [facility] ED, home oxygen saturation was 87% on CPAP prior to  EMS arrival.  On initial arrival to the ED he was noted to have significant expiratory wheezing with multiple family members noted to be positive for RSV in the past several days.  Patient has tested positive for RSV upon arrival here at [facility] emergency department. Patient is Covid negative.  Chest x-ray does not reveal any definitive infiltrate  D-dimer is only minimally elevated and patient is less likely to be suffering from an acute pulmonary embolism.  No history of recent long distance travel.  Most likely, patient is suffering from an RSV acute bronchitis in a patient with multiple sclerosis and immunocompromise. patient does not carry a diagnosis of COPD.  Although BNP is elevated, low index of suspicion for decompensated CHF.  Continue IV Solu-Medrol, bronchodilator nebulizations, aggressive pulmonary toilet and supportive care.  Discontinued Lasix.  Continue monitoring in stepdown unit overnight.  Was on BiPAP since arrival in the ED until approximately 8 AM today.  BiPAP as needed.  Wean oxygen as needed for oxygen saturations >92%.  Continue prior home CPAP at bedtime.   RSV infection  May have contracted from multiple family members who have RSV.  Supportive care.  Multiple sclerosis (HCC)  Continue home regimen of Vumerity  Follows with neurology at [facility]  Major depressive disorder  Continue prior home regimen.  Peripheral polyneuropathy  Continue Lyrica.  Type 2 diabetes mellitus with complication, without long-term current use of insulin (HCC)  Patient was diagnosed and hospitalized with diabetes mellitus type 2 with diabetic ketoacidosis in 2018  Patient states   that he no longer has diabetes and that he is no longer on therapy.  Since he was initiated the patient on steroid therapy, monitor CBGs and placed on SSI  Follow hemoglobin A1c  Mixed hyperlipidemia  Continue home statin therapy.  GERD without esophagitis  Continue  PPI.  Elevated brain natriuretic peptide (BNP) level  BNP 370 on admission.  Clinically however does not appear overtly volume overloaded.  Reassess in a.m. regarding need for any further IV Lasix.  Troponin negative.  TTE: Poor image quality.  LVEF 40 to 45%.  Global hypokinesis.  Grade 1 diastolic dysfunction.  Tobacco abuse  Smokes up to a pack per day for several years.  Cessation counseled.  Patient declines nicotine patch.  Syncope and collapse  Reportedly occurred 4 to 5 days [place].  This was reported by patient's daughter who indicated that after he drove back from the mountains, he has no recollection of how he got back home.  Monitor on telemetry for arrhythmias.  Check TTE.  Patient should not drive for 6 months.  Thrombocytopenia (HCC)  Appears to be chronic.  Periodically follow CBC.  Cardiomyopathy (HCC)  TTE 9/26: LVEF 40-45% with global hypokinesis.  Consider cardiology consultation inpatient versus outpatient.    The patient's chronic medical conditions were treated accordingly per the patient's home medication regimen except as noted.  On day of discharge, patient was felt deemed stable for discharge. Patient/family member advised to call PCP or come back to ER if needed.   Discharge Diagnoses:   Principal Diagnosis: Acute respiratory failure with hypoxia (HCC)  Active Hospital Problems   Diagnosis Date Noted  . Acute respiratory failure with hypoxia (HCC) 09/12/2020  . RSV infection 09/12/2020  . GERD without esophagitis 09/12/2020  . Multiple sclerosis (HCC) 09/12/2020  . Major depressive disorder 09/12/2020  . Mixed hyperlipidemia 09/12/2020  . Peripheral polyneuropathy 09/12/2020  . Elevated brain natriuretic peptide (BNP) level 09/12/2020  . Type 2 diabetes mellitus with complication, without long-term current use of insulin (HCC) 09/12/2020  . Obstructive sleep apnea 09/12/2020  . Tobacco  abuse 09/12/2020  . Syncope and collapse 09/12/2020  . Cardiomyopathy (HCC) 09/12/2020  . Thrombocytopenia (HCC) 08/28/2017    Resolved Hospital Problems  No resolved problems to display.    Discharge Instructions    Diet Carb Modified   Complete by: As directed    Increase activity slowly   Complete by: As directed      Allergies as of 09/14/2020      Reactions   Aspirin    Bleeding      Medication List    STOP taking these medications   acetaminophen 500 MG tablet Commonly known as: TYLENOL   dexamethasone 4 MG tablet Commonly known as: DECADRON   insulin aspart 100 UNIT/ML FlexPen Commonly known as: NovoLOG FlexPen   insulin glargine 100 UNIT/ML Solostar Pen Commonly known as: LANTUS   Insulin Pen Needle 32G X 8 MM Misc   meloxicam 15 MG tablet Commonly known as: MOBIC   metFORMIN 500 MG tablet Commonly known as: Glucophage     TAKE these medications   albuterol 108 (90 Base) MCG/ACT inhaler Commonly known as: VENTOLIN HFA Inhale 2 puffs into the lungs every 6 (six) hours as needed for wheezing or shortness of breath.   atorvastatin 10 MG tablet Commonly known as: LIPITOR Take 10 mg by mouth daily. Notes to patient: 09/15/20   buPROPion 150 MG 24 hr tablet   Commonly known as: WELLBUTRIN XL Take 150 mg by mouth daily. Notes to patient: 09/15/20   DULoxetine 60 MG capsule Commonly known as: CYMBALTA Take 60 mg by mouth daily. Notes to patient: 09/15/20   freestyle lancets Use as instructed   glucose blood test strip Commonly known as: FREESTYLE TEST STRIPS Use as instructed   glucose monitoring kit monitoring kit 1 each by Does not apply route 4 (four) times daily - after meals and at bedtime. 1 month Diabetic Testing Supplies for QAC-QHS accuchecks.   levothyroxine 50 MCG tablet Commonly known as: SYNTHROID Take 50 mcg by mouth daily before breakfast. Notes to patient: 09/15/20   losartan 25 MG tablet Commonly known as: COZAAR Take 0.5  tablets (12.5 mg total) by mouth daily. Notes to patient: 09/15/20   methocarbamol 750 MG tablet Commonly known as: ROBAXIN Take 750 mg by mouth 3 (three) times daily. Notes to patient: NOT GIVEN THIS ADMIT   midodrine 5 MG tablet Commonly known as: PROAMATINE Take 5 mg by mouth daily. Notes to patient: 09/15/20   omeprazole 40 MG capsule Commonly known as: PRILOSEC Take 40 mg by mouth daily. Notes to patient: 09/15/20   pregabalin 150 MG capsule Commonly known as: LYRICA Take 150 mg by mouth in the morning, at noon, and at bedtime. Notes to patient: 09/14/20   Vumerity 231 MG Cpdr Generic drug: Diroximel Fumarate Take 2 capsules by mouth in the morning and at bedtime. Notes to patient: 09/14/20   zolpidem 10 MG tablet Commonly known as: AMBIEN Take 10 mg by mouth at bedtime as needed for sleep. Notes to patient: TAKE AS BEFORE       Follow-up Information    Turner, Traci R, MD Follow up.   Specialty: Cardiology Why: Our office will contact you to arrange coronary CT and heart monitor and to schedule follow-up appointment.  Contact information: [address] [place] [address] [phone]              Allergies  Allergen Reactions  . Aspirin     Bleeding    Consultations: Cardiology   Discharge Exam: BP 114/62   Pulse 73   Temp 98.1 F (36.7 C) (Oral)   Resp 19   Ht 5' 9" (1.753 m)   Wt 97.8 kg   SpO2 93%   BMI 31.84 kg/m  General appearance: alert, cooperative and no distress Head: Normocephalic, without obvious abnormality, atraumatic Eyes: EOMI Lungs: mostly CTABL Heart: regular rate and rhythm and S1, S2 normal Abdomen: normal findings: bowel sounds normal and soft, non-tender Extremities: no edema Skin: mobility and turgor normal Neurologic: Grossly normal  The results of significant diagnostics from this hospitalization (including imaging, microbiology, ancillary and laboratory) are listed below for reference.    Microbiology: Recent Results (from the past 240 hour(s))  Resp Panel by RT PCR (RSV, Flu A&B, Covid) - Nasopharyngeal Swab     Status: Abnormal   Collection Time: 09/12/20 12:15 AM   Specimen: Nasopharyngeal Swab  Result Value Ref Range Status   SARS Coronavirus 2 by RT PCR NEGATIVE NEGATIVE Final    Comment: (NOTE) SARS-CoV-2 target nucleic acids are NOT DETECTED.  The SARS-CoV-2 RNA is generally detectable in upper respiratoy specimens during the acute phase of infection. The lowest concentration of SARS-CoV-2 viral copies this assay can detect is 131 copies/mL. A negative result does not preclude SARS-Cov-2 infection and should not be used as the sole basis for treatment or other patient management decisions. A   negative result may occur with  improper specimen collection/handling, submission of specimen other than nasopharyngeal swab, presence of viral mutation(s) within the areas targeted by this assay, and inadequate number of viral copies (<131 copies/mL). A negative result must be combined with clinical observations, patient history, and epidemiological information. The expected result is Negative.  Fact Sheet for Patients:  PinkCheek.be  Fact Sheet for Healthcare Providers:  GravelBags.it  This test is no t yet approved or cleared by the [place] FDA and  has been authorized for detection and/or diagnosis of SARS-CoV-2 by FDA under an Emergency Use Authorization (EUA). This EUA will remain  in effect (meaning this test can be used) for the duration of the COVID-19 declaration under Section 564(b)(1) of the Act, 21 U.S.C. section 360bbb-3(b)(1), unless the authorization is terminated or revoked sooner.     Influenza A by PCR NEGATIVE NEGATIVE Final   Influenza B by PCR NEGATIVE NEGATIVE Final    Comment: (NOTE) The Xpert Xpress SARS-CoV-2/FLU/RSV assay is intended as an aid in  the diagnosis of influenza  from Nasopharyngeal swab specimens and  should not be used as a sole basis for treatment. Nasal washings and  aspirates are unacceptable for Xpert Xpress SARS-CoV-2/FLU/RSV  testing.  Fact Sheet for Patients: PinkCheek.be  Fact Sheet for Healthcare Providers: GravelBags.it  This test is not yet approved or cleared by the [place] FDA and  has been authorized for detection and/or diagnosis of SARS-CoV-2 by  FDA under an Emergency Use Authorization (EUA). This EUA will remain  in effect (meaning this test can be used) for the duration of the  Covid-19 declaration under Section 564(b)(1) of the Act, 21  U.S.C. section 360bbb-3(b)(1), unless the authorization is  terminated or revoked.    Respiratory Syncytial Virus by PCR POSITIVE (A) NEGATIVE Final    Comment: (NOTE) Fact Sheet for Patients: PinkCheek.be  Fact Sheet for Healthcare Providers: GravelBags.it  This test is not yet approved or cleared by the [place] FDA and  has been authorized for detection and/or diagnosis of SARS-CoV-2 by  FDA under an Emergency Use Authorization (EUA). This EUA will remain  in effect (meaning this test can be used) for the duration of the  COVID-19 declaration under Section 564(b)(1) of the Act, 21 U.S.C.  section 360bbb-3(b)(1), unless the authorization is terminated or  revoked. Performed at [facility], [place] [address], [place], [place] [address]   Blood culture (routine x 2)     Status: None (Preliminary result)   Collection Time: 09/12/20 12:15 AM   Specimen: BLOOD  Result Value Ref Range Status   Specimen Description   Final    BLOOD BLOOD RIGHT HAND Performed at [facility] [address] [place], [place] [address]    Special Requests   Final    BOTTLES DRAWN AEROBIC AND ANAEROBIC Blood Culture results may not be optimal  due to an inadequate volume of blood received in culture bottles Performed at [facility] [address] [place], [place] [address]    Culture   Final    NO GROWTH 3 DAYS Performed at [facility], [place] [address] [place], [place] [address]    Report Status PENDING  Incomplete  Blood culture (routine x 2)     Status: None (Preliminary result)   Collection Time: 09/12/20 12:15 AM   Specimen: BLOOD  Result Value Ref Range Status   Specimen Description   Final    BLOOD RIGHT  ANTECUBITAL Performed at [facility], [place] [address] [place], [place] [address]    Special Requests   Final    BOTTLES DRAWN AEROBIC AND ANAEROBIC Blood Culture adequate volume Performed at [place] [address], [place], [place] [address]    Culture   Final    NO GROWTH 3 DAYS Performed at [facility], [place] [address] [place], [place] [address]    Report Status PENDING  Incomplete  MRSA PCR Screening     Status: None   Collection Time: 09/12/20  1:36 PM   Specimen: Nasal Mucosa; Nasopharyngeal  Result Value Ref Range Status   MRSA by PCR NEGATIVE NEGATIVE Final    Comment:        The GeneXpert MRSA Assay (FDA approved for NASAL specimens only), is one component of a comprehensive MRSA colonization surveillance program. It is not intended to diagnose MRSA infection nor to guide or monitor treatment for MRSA infections. Performed at [facility], [place] [address], [place], [place] [address]      Labs: BNP (last 3 results) Recent Labs    09/12/20 0015  BNP 937.9*   Basic Metabolic Panel: Recent Labs  Lab 09/12/20 0015 09/13/20 0320  NA 136 140  K 3.8 4.6  CL 102 102  CO2 24 27  GLUCOSE 155* 204*  BUN 8 23*  CREATININE 1.09 1.10  CALCIUM 8.2* 9.1  MG  --  2.4   Liver Function Tests: Recent Labs  Lab 09/13/20 0320  AST 29  ALT 48*  ALKPHOS 68  BILITOT 0.4  PROT 7.4  ALBUMIN 4.2    No results for input(s): LIPASE, AMYLASE in the last 168 hours. No results for input(s): AMMONIA in the last 168 hours. CBC: Recent Labs  Lab 09/12/20 0015 09/13/20 0320  WBC 8.5 13.0*  NEUTROABS 7.0 11.4*  HGB 16.5 15.8  HCT 50.1 47.4  MCV 90.3 90.8  PLT 109* 126*   Cardiac Enzymes: No results for input(s): CKTOTAL, CKMB, CKMBINDEX, TROPONINI in the last 168 hours. BNP: Invalid input(s): POCBNP CBG: Recent Labs  Lab 09/13/20 1212 09/13/20 1605 09/13/20 2054 09/14/20 0751 09/14/20 1155  GLUCAP 222* 198* 128* 122* 218*   D-Dimer No results for input(s): DDIMER in the last 72 hours. Hgb A1c No results for input(s): HGBA1C in the last 72 hours. Lipid Profile No results for input(s): CHOL, HDL, LDLCALC, TRIG, CHOLHDL, LDLDIRECT in the last 72 hours. Thyroid function studies No results for input(s): TSH, T4TOTAL, T3FREE, THYROIDAB in the last 72 hours.  Invalid input(s): FREET3 Anemia work up No results for input(s): VITAMINB12, FOLATE, FERRITIN, TIBC, IRON, RETICCTPCT in the last 72 hours. Urinalysis    Component Value Date/Time   COLORURINE STRAW (A) 08/28/2017 1801   APPEARANCEUR CLEAR 08/28/2017 1801   LABSPEC 1.031 (H) 08/28/2017 1801   PHURINE 5.0 08/28/2017 1801   GLUCOSEU >=500 (A) 08/28/2017 1801   HGBUR NEGATIVE 08/28/2017 1801   BILIRUBINUR NEGATIVE 08/28/2017 1801   KETONESUR 5 (A) 08/28/2017 1801   PROTEINUR NEGATIVE 08/28/2017 1801   NITRITE NEGATIVE 08/28/2017 1801   LEUKOCYTESUR NEGATIVE 08/28/2017 1801   Sepsis Labs Invalid input(s): PROCALCITONIN,  WBC,  LACTICIDVEN Microbiology Recent Results (from the past 240 hour(s))  Resp Panel by RT PCR (RSV, Flu A&B, Covid) - Nasopharyngeal Swab     Status: Abnormal   Collection Time: 09/12/20 12:15 AM   Specimen: Nasopharyngeal Swab  Result Value Ref Range Status   SARS Coronavirus 2 by  RT PCR NEGATIVE NEGATIVE Final    Comment: (NOTE) SARS-CoV-2 target nucleic acids are NOT DETECTED.  The  SARS-CoV-2 RNA is generally detectable in upper respiratoy specimens during the acute phase of infection. The lowest concentration of SARS-CoV-2 viral copies this assay can detect is 131 copies/mL. A negative result does not preclude SARS-Cov-2 infection and should not be used as the sole basis for treatment or other patient management decisions. A negative result may occur with  improper specimen collection/handling, submission of specimen other than nasopharyngeal swab, presence of viral mutation(s) within the areas targeted by this assay, and inadequate number of viral copies (<131 copies/mL). A negative result must be combined with clinical observations, patient history, and epidemiological information. The expected result is Negative.  Fact Sheet for Patients:  https://www.fda.gov/media/142436/download  Fact Sheet for Healthcare Providers:  https://www.fda.gov/media/142435/download  This test is no t yet approved or cleared by the [place] FDA and  has been authorized for detection and/or diagnosis of SARS-CoV-2 by FDA under an Emergency Use Authorization (EUA). This EUA will remain  in effect (meaning this test can be used) for the duration of the COVID-19 declaration under Section 564(b)(1) of the Act, 21 U.S.C. section 360bbb-3(b)(1), unless the authorization is terminated or revoked sooner.     Influenza A by PCR NEGATIVE NEGATIVE Final   Influenza B by PCR NEGATIVE NEGATIVE Final    Comment: (NOTE) The Xpert Xpress SARS-CoV-2/FLU/RSV assay is intended as an aid in  the diagnosis of influenza from Nasopharyngeal swab specimens and  should not be used as a sole basis for treatment. Nasal washings and  aspirates are unacceptable for Xpert Xpress SARS-CoV-2/FLU/RSV  testing.  Fact Sheet for Patients: https://www.fda.gov/media/142436/download  Fact Sheet for Healthcare Providers: https://www.fda.gov/media/142435/download  This test is not yet approved or cleared  by the [place] FDA and  has been authorized for detection and/or diagnosis of SARS-CoV-2 by  FDA under an Emergency Use Authorization (EUA). This EUA will remain  in effect (meaning this test can be used) for the duration of the  Covid-19 declaration under Section 564(b)(1) of the Act, 21  U.S.C. section 360bbb-3(b)(1), unless the authorization is  terminated or revoked.    Respiratory Syncytial Virus by PCR POSITIVE (A) NEGATIVE Final    Comment: (NOTE) Fact Sheet for Patients: https://www.fda.gov/media/142436/download  Fact Sheet for Healthcare Providers: https://www.fda.gov/media/142435/download  This test is not yet approved or cleared by the [place] FDA and  has been authorized for detection and/or diagnosis of SARS-CoV-2 by  FDA under an Emergency Use Authorization (EUA). This EUA will remain  in effect (meaning this test can be used) for the duration of the  COVID-19 declaration under Section 564(b)(1) of the Act, 21 U.S.C.  section 360bbb-3(b)(1), unless the authorization is terminated or  revoked. Performed at [facility], [address] [place], [place] [address]   Blood culture (routine x 2)     Status: None (Preliminary result)   Collection Time: 09/12/20 12:15 AM   Specimen: BLOOD  Result Value Ref Range Status   Specimen Description   Final    BLOOD BLOOD RIGHT HAND Performed at [facility], [address] [place], [place] [address]    Special Requests   Final    BOTTLES DRAWN AEROBIC AND ANAEROBIC Blood Culture results may not be optimal due to an inadequate volume of blood received in culture bottles Performed at [facility], [address] [place], [place] [address]    Culture     Final    NO GROWTH 3 DAYS Performed at [facility], [address] [place], [place] [address]    Report Status PENDING  Incomplete  Blood culture (routine x 2)     Status: None (Preliminary result)    Collection Time: 09/12/20 12:15 AM   Specimen: BLOOD  Result Value Ref Range Status   Specimen Description   Final    BLOOD RIGHT ANTECUBITAL Performed at [facility], [address] [place], [place] [address]    Special Requests   Final    BOTTLES DRAWN AEROBIC AND ANAEROBIC Blood Culture adequate volume Performed at [facility], [address] [place], [place] [address]    Culture   Final    NO GROWTH 3 DAYS Performed at [facility], [address] [place],  [address]    Report Status PENDING  Incomplete  MRSA PCR Screening     Status: None   Collection Time: 09/12/20  1:36 PM   Specimen: Nasal Mucosa; Nasopharyngeal  Result Value Ref Range Status   MRSA by PCR NEGATIVE NEGATIVE Final    Comment:        The GeneXpert MRSA Assay (FDA approved for NASAL specimens only), is one component of a comprehensive MRSA colonization surveillance program. It is not intended to diagnose MRSA infection nor to guide or monitor treatment for MRSA infections. Performed at  [facility], [address] ,  [address]     Procedures/Studies: CT ANGIO CHEST PE W OR WO CONTRAST  Result Date: 09/13/2020 CLINICAL DATA:  Syncope, elevated D-dimer EXAM: CT ANGIOGRAPHY CHEST WITH CONTRAST TECHNIQUE: Multidetector CT imaging of the chest was performed using the standard protocol during bolus administration of intravenous contrast. Multiplanar CT image reconstructions and MIPs were obtained to evaluate the vascular anatomy. CONTRAST:  100mL OMNIPAQUE IOHEXOL 350 MG/ML SOLN COMPARISON:  Chest x-ray 09/12/2020 FINDINGS: Cardiovascular: No filling defects in the pulmonary arteries to suggest pulmonary emboli. Heart is normal size. Scattered coronary artery calcifications. Aorta normal caliber. Mediastinum/Nodes: No mediastinal, hilar, or axillary adenopathy. Trachea and esophagus are unremarkable. Thyroid unremarkable.  Lungs/Pleura: Lungs are clear. No focal airspace opacities or suspicious nodules. No effusions. Upper Abdomen: Diffuse fatty infiltration of the liver. Musculoskeletal: Chest wall soft tissues are unremarkable. No acute bony abnormality. Review of the MIP images confirms the above findings. IMPRESSION: No evidence of pulmonary embolus. Coronary artery disease. Diffuse fatty infiltration of the liver. No acute cardiopulmonary disease. Electronically Signed   By: Kevin  Dover M.D.   On: 09/13/2020 16:15   DG Chest Portable 1 View  Result Date: 09/12/2020 CLINICAL DATA:  Shortness of breath. No known COVID exposure. Current smoker. EXAM: PORTABLE CHEST 1 VIEW COMPARISON:  None. FINDINGS: Shallow inspiration. Cardiac enlargement. No vascular congestion or edema. No focal consolidation or airspace disease. No pleural effusions. No pneumothorax. Mediastinal contours appear intact. Postoperative changes in the cervical spine. IMPRESSION: Cardiac enlargement. No evidence of active pulmonary disease. Electronically Signed   By: William  Stevens M.D.   On: 09/12/2020 00:18   ECHOCARDIOGRAM COMPLETE  Result Date: 09/12/2020    ECHOCARDIOGRAM REPORT   Patient Name:   Sharmarke P Stout Date of Exam: 09/12/2020 Medical Rec #:  [id number]      Height:       69.0 in Accession #:    [id number]     Weight:       215.6 lb Date of Birth:  11/08/1961        BSA:          2.133 m Patient Age:    59 years       BP:           124/78 mmHg Patient Gender: M              HR:           79 bpm. Exam Location:  Inpatient Procedure: 2D Echo, Cardiac Doppler, Color Doppler and Intracardiac            Opacification Agent Indications:    I50.9* Heart failure (unspecified)  History:        Patient has no prior history of Echocardiogram examinations.                 Signs/Symptoms:Dyspnea and Shortness of Breath; Risk                 Factors:Sleep Apnea and Diabetes. Multiple sclerosis. Hypoxia.                 RSV.  Sonographer:    Roseanna Rainbow RDCS  Referring Phys: [id number] [place]  Sonographer Comments: Technically difficult study due to poor echo windows, Technically challenging study due to limited acoustic windows, suboptimal parasternal window, suboptimal apical window and suboptimal subcostal window. Image acquisition challenging due to patient body habitus and Image acquisition challenging due to respiratory motion. Study is nearly non- diagnostic due to respiratory motion and poor windows. Patient could not turn or bend left arm. Extremely difficult study. IMPRESSIONS  1. Poor image quality even with administration of Definity contrast. There appears to be global hypokinesis slightly worse in the septum.. Left ventricular ejection fraction, by estimation, is 40 to 45%. The left ventricle has mildly decreased function.  The left ventricle demonstrates regional wall motion abnormalities (see scoring diagram/findings for description). There is mild concentric left ventricular hypertrophy. Left ventricular diastolic parameters are consistent with Grade I diastolic dysfunction (impaired relaxation).  2. Right ventricular systolic function is mildly reduced. The right ventricular size is normal.  3. The mitral valve is normal in structure. No evidence of mitral valve regurgitation. No evidence of mitral stenosis.  4. The aortic valve is normal in structure. Aortic valve regurgitation is not visualized. No aortic stenosis is present.  5. The inferior vena cava is normal in size with greater than 50% respiratory variability, suggesting right atrial pressure of 3 mmHg. FINDINGS  Left Ventricle: Poor image quality even with administration of Definity contrast. There appears to be global hypokinesis slightly worse in the septum. Left ventricular ejection fraction, by estimation, is 40 to 45%. The left ventricle has mildly decreased function. The left ventricle demonstrates regional wall motion abnormalities. Definity contrast agent was given IV to  delineate the left ventricular endocardial borders. The left ventricular internal cavity size was normal in size. There is mild concentric left ventricular hypertrophy. Left ventricular diastolic parameters are consistent with Grade I diastolic dysfunction (impaired relaxation). Normal left ventricular filling pressure. Right Ventricle: The right ventricular size is normal. No increase in right ventricular wall thickness. Right ventricular systolic function is mildly reduced. Left Atrium: Left atrial size was normal in size. Right Atrium: Right atrial size was normal in size. Pericardium: There is no evidence of pericardial effusion. Mitral Valve: The mitral valve is normal in structure. No evidence of mitral valve regurgitation. No evidence of mitral valve stenosis. Tricuspid Valve: The tricuspid valve is normal in structure. Tricuspid valve regurgitation is trivial. No evidence of tricuspid  stenosis. Aortic Valve: The aortic valve is normal in structure. Aortic valve regurgitation is not visualized. No aortic stenosis is present. Pulmonic Valve: The pulmonic valve was normal in structure. Pulmonic valve regurgitation is not visualized. No evidence of pulmonic stenosis. Aorta: The aortic root is normal in size and structure. Venous: The inferior vena cava is normal in size with greater than 50% respiratory variability, suggesting right atrial pressure of 3 mmHg. IAS/Shunts: No atrial level shunt detected by color flow Doppler.  LEFT VENTRICLE PLAX 2D LVIDd:         5.57 cm  Diastology LVIDs:         4.90 cm  LV e' medial:    5.11 cm/s LV PW:         1.15 cm  LV E/e' medial:  8.2 LV IVS:        1.15 cm  LV e' lateral:   8.38 cm/s LVOT diam:     2.25 cm  LV E/e' lateral: 5.0 LV SV:         63 LV SV Index:   29 LVOT Area:     3.98 cm  RIGHT VENTRICLE RV S prime:     11.00 cm/s TAPSE (M-mode): 1.4 cm LEFT ATRIUM             Index       RIGHT ATRIUM           Index LA diam:        3.90 cm 1.83 cm/m  RA Area:     10.40  cm LA Vol (A2C):   29.7 ml 13.92 ml/m RA Volume:   17.90 ml  8.39 ml/m LA Vol (A4C):   13.1 ml 6.14 ml/m LA Biplane Vol: 18.5 ml 8.67 ml/m  AORTIC VALVE LVOT Vmax:   88.80 cm/s LVOT Vmean:  64.700 cm/s LVOT VTI:    0.158 m  AORTA Ao Root diam: 3.90 cm MITRAL VALVE               TRICUSPID VALVE MV Area (PHT): 3.21 cm    TR Peak grad:   7.7 mmHg MV Decel Time: 236 msec    TR Vmax:        139.00 cm/s MV E velocity: 42.00 cm/s MV A velocity: 68.10 cm/s  SHUNTS MV E/A ratio:  0.62        Systemic VTI:  0.16 m                            Systemic Diam: 2.25 cm Skeet Latch MD Electronically signed by Skeet Latch MD Signature Date/Time: 09/12/2020/11:50:50 AM    Final      Time coordinating discharge: Over 30 minutes    Dwyane Dee, MD  [facility] 09/15/2020, 6:20 PM Pager: Secure chat  If 7PM-7AM, please contact night-coverage [email or web address] Password [email or web address]

## 2020-09-16 ENCOUNTER — Encounter (HOSPITAL_COMMUNITY): Payer: Self-pay | Admitting: Emergency Medicine

## 2020-09-16 ENCOUNTER — Emergency Department (HOSPITAL_COMMUNITY): Payer: Medicare HMO

## 2020-09-16 ENCOUNTER — Telehealth: Payer: Self-pay | Admitting: Student

## 2020-09-16 ENCOUNTER — Emergency Department (HOSPITAL_COMMUNITY)
Admission: EM | Admit: 2020-09-16 | Discharge: 2020-09-16 | Disposition: A | Discharge status: Home / Self Care | Payer: Medicare HMO | Attending: Emergency Medicine | Admitting: Emergency Medicine

## 2020-09-16 DIAGNOSIS — E119 Type 2 diabetes mellitus without complications: Secondary | ICD-10-CM | POA: Diagnosis not present

## 2020-09-16 DIAGNOSIS — Z7984 Long term (current) use of oral hypoglycemic drugs: Secondary | ICD-10-CM | POA: Diagnosis not present

## 2020-09-16 DIAGNOSIS — R55 Syncope and collapse: Secondary | ICD-10-CM | POA: Diagnosis present

## 2020-09-16 DIAGNOSIS — F17210 Nicotine dependence, cigarettes, uncomplicated: Secondary | ICD-10-CM | POA: Insufficient documentation

## 2020-09-16 LAB — BASIC METABOLIC PANEL
Anion gap: 12 (ref 5–15)
BUN: 9 mg/dL (ref 6–20)
CO2: 26 mmol/L (ref 22–32)
Calcium: 8.8 mg/dL — ABNORMAL LOW (ref 8.9–10.3)
Chloride: 101 mmol/L (ref 98–111)
Creatinine, Ser: 1.22 mg/dL (ref 0.61–1.24)
GFR calc Af Amer: >60 mL/min (ref >60)
GFR calc non Af Amer: >60 mL/min (ref >60)
Glucose, Bld: 137 mg/dL — ABNORMAL HIGH (ref 70–99)
Potassium: 3.7 mmol/L (ref 3.5–5.1)
Sodium: 139 mmol/L (ref 135–145)

## 2020-09-16 LAB — CBC WITH DIFFERENTIAL/PLATELET
Abs Immature Granulocytes: 0.06 10*3/uL (ref 0.00–0.07)
Basophils Absolute: 0 10*3/uL (ref 0.0–0.1)
Basophils Relative: 1 %
Eosinophils Absolute: 0.1 10*3/uL (ref 0.0–0.5)
Eosinophils Relative: 1 %
HCT: 51.8 % (ref 39.0–52.0)
Hemoglobin: 17.1 g/dL — ABNORMAL HIGH (ref 13.0–17.0)
Immature Granulocytes: 1 %
Lymphocytes Relative: 14 %
Lymphs Abs: 1.1 10*3/uL (ref 0.7–4.0)
MCH: 29.6 pg (ref 26.0–34.0)
MCHC: 33 g/dL (ref 30.0–36.0)
MCV: 89.6 fL (ref 80.0–100.0)
Monocytes Absolute: 0.8 10*3/uL (ref 0.1–1.0)
Monocytes Relative: 11 %
Neutro Abs: 5.5 10*3/uL (ref 1.7–7.7)
Neutrophils Relative %: 72 %
Platelets: 170 10*3/uL (ref 150–400)
RBC: 5.78 MIL/uL (ref 4.22–5.81)
RDW: 13.6 % (ref 11.5–15.5)
WBC: 7.6 10*3/uL (ref 4.0–10.5)
nRBC: 0 % (ref 0.0–0.2)

## 2020-09-16 LAB — TROPONIN I (HIGH SENSITIVITY)
Troponin I (High Sensitivity): 5 ng/L (ref <18)
Troponin I (High Sensitivity): 4 ng/L (ref <18)

## 2020-09-16 LAB — CBG MONITORING, ED
Glucose-Capillary: 143 mg/dL — ABNORMAL HIGH (ref 70–99)
Glucose-Capillary: 128 mg/dL — ABNORMAL HIGH (ref 70–99)

## 2020-09-16 NOTE — ED Notes (Addendum)
Patient ambulated in hallway with a steady gait.  [place] PA-C notified.

## 2020-09-16 NOTE — Telephone Encounter (Signed)
   Pt's wife calling, she said pt been not feeling well and need to get CT morphe schedule as soon as possible, secured chat  Florina Ou, she said she will work the authorization and advised pt's wife CT scheduler will reach out soon. She understood.

## 2020-09-16 NOTE — ED Provider Notes (Signed)
[facility] EMERGENCY DEPARTMENT Provider Note   CSN: [id number] Arrival date & time: 09/16/20  1427     History Chief Complaint  Patient presents with  . Loss of Consciousness    Larry [name] is a 59 y.o. male has a past medical history of type 2 diabetes, syncope, multiple sclerosis, recently discharged for acute respiratory failure due to RSV on 09/14/2020.  He is presenting today for a recurrent episode of syncope.  He was sitting in his chair and had a fit of coughing.  When he leaned over to spit out his phlegm, he syncopized.  His wife states he was diaphoretic after the syncope.  He did not have any injuries.  Is not the first time this is occurred, and it was worked up during his admission this week.  He has outpatient Holter monitoring scheduled as well as CT of his coronaries scheduled outpatient for further work-up of the syncope.  He states his prior episodes of syncope occur when he changes positions or when he had bent over to grab something below his waist as well. No injuries from the episode today.  Denies chest pain or changes in shortness of breath, palpitations, or other complaints.  The history is provided by the patient and medical records.       Past Medical History:  Diagnosis Date  . Arthritis   . Nerve pain     Patient Active Problem List   Diagnosis Date Noted  . Acute respiratory failure with hypoxia ([place]) 09/12/2020  . RSV infection 09/12/2020  . GERD without esophagitis 09/12/2020  . Multiple sclerosis ([place]) 09/12/2020  . Major depressive disorder 09/12/2020  . Mixed hyperlipidemia 09/12/2020  . Peripheral polyneuropathy 09/12/2020  . Elevated brain natriuretic peptide (BNP) level 09/12/2020  . Type 2 diabetes mellitus with complication, without long-term current use of insulin ([place]) 09/12/2020  . Obstructive sleep apnea 09/12/2020  . Tobacco abuse 09/12/2020  . Syncope and collapse 09/12/2020  . Cardiomyopathy ([place])  09/12/2020  . Cervical stenosis of spinal canal 08/28/2017  . Hyponatremia 08/28/2017  . Elevated LFTs 08/28/2017  . Steroid-induced hyperglycemia 08/28/2017  . DKA (diabetic ketoacidoses) ([place]) 08/28/2017  . Thrombocytopenia ([place]) 08/28/2017    History reviewed. No pertinent surgical history.     Family History  Problem Relation Age of Onset  . Heart attack Mother 34  . Lung cancer Neg Hx     Social History   Tobacco Use  . Smoking status: Current Every Day Smoker    Types: Cigarettes  . Smokeless tobacco: Never Used  Substance Use Topics  . Alcohol use: No  . Drug use: No    Home Medications Prior to Admission medications   Medication Sig Start Date End Date Taking? Authorizing Provider  albuterol (VENTOLIN HFA) 108 (90 Base) MCG/ACT inhaler Inhale 2 puffs into the lungs every 6 (six) hours as needed for wheezing or shortness of breath. 09/14/20  Yes Dwyane Dee, MD  atorvastatin (LIPITOR) 10 MG tablet Take 10 mg by mouth daily.   Yes [provider]  azithromycin (ZITHROMAX) 250 MG tablet Take 250-500 mg by mouth See admin instructions. Take 2 tablets (500 mg totally) by mouth in day 1; then 1 tablet (250 mg totally) by mouth day 2 to 5 09/15/20  Yes [provider]  buPROPion (WELLBUTRIN XL) 150 MG 24 hr tablet Take 150 mg by mouth daily.   Yes [provider]  Diroximel Fumarate (VUMERITY) 231 MG CPDR Take 2 capsules by  mouth in the morning and at bedtime.   Yes [provider]  DULoxetine (CYMBALTA) 60 MG capsule Take 60 mg by mouth daily. 08/07/17  Yes [provider]  levothyroxine (SYNTHROID) 50 MCG tablet Take 50 mcg by mouth daily before breakfast.   Yes [provider]  losartan (COZAAR) 25 MG tablet Take 0.5 tablets (12.5 mg total) by mouth daily. 09/15/20  Yes Dwyane Dee, MD  methocarbamol (ROBAXIN) 750 MG tablet Take 750 mg by mouth 3 (three) times daily.   Yes [provider]  midodrine  (PROAMATINE) 5 MG tablet Take 5 mg by mouth daily.   Yes [provider]  Multiple Vitamin (MULTIVITAMIN) tablet Take 1 tablet by mouth daily.   Yes [provider]  omeprazole (PRILOSEC) 40 MG capsule Take 40 mg by mouth daily.   Yes [provider]  pregabalin (LYRICA) 150 MG capsule Take 150 mg by mouth in the morning, at noon, and at bedtime.   Yes [provider]  zolpidem (AMBIEN) 10 MG tablet Take 10 mg by mouth at bedtime as needed for sleep.   Yes [provider]  glucose blood (FREESTYLE TEST STRIPS) test strip Use as instructed 08/30/17   Ghimire, Henreitta Leber, MD  glucose monitoring kit (FREESTYLE) monitoring kit 1 each by Does not apply route 4 (four) times daily - after meals and at bedtime. 1 month Diabetic Testing Supplies for QAC-QHS accuchecks. 08/30/17   Ghimire, Henreitta Leber, MD  Lancets (FREESTYLE) lancets Use as instructed 08/30/17   Ghimire, Henreitta Leber, MD    Allergies    Aspirin  Review of Systems   Review of Systems  Respiratory: Positive for cough.   Cardiovascular: Negative for chest pain and palpitations.  Neurological: Positive for syncope.  All other systems reviewed and are negative.   Physical Exam Updated Vital Signs BP 117/70 (BP Location: Right Arm)   Pulse 85   Temp 98.9 F (37.2 C) (Oral)   Resp 20   SpO2 95%   Physical Exam Vitals and nursing note reviewed.  Constitutional:      General: He is not in acute distress.    Appearance: He is well-developed.  HENT:     Head: Normocephalic and atraumatic.  Eyes:     Extraocular Movements: Extraocular movements intact.     Conjunctiva/sclera: Conjunctivae normal.     Pupils: Pupils are equal, round, and reactive to light.  Cardiovascular:     Rate and Rhythm: Normal rate and regular rhythm.  Pulmonary:     Effort: Pulmonary effort is normal. No respiratory distress.     Breath sounds: Wheezing present.  Abdominal:     General: Bowel sounds are normal.      Palpations: Abdomen is soft.     Tenderness: There is no abdominal tenderness.  Musculoskeletal:     Right lower leg: No edema.     Left lower leg: No edema.  Skin:    General: Skin is warm.  Neurological:     General: No focal deficit present.     Mental Status: He is alert.  Psychiatric:        Behavior: Behavior normal.     ED Results / Procedures / Treatments   Labs (all labs ordered are listed, but only abnormal results are displayed) Labs Reviewed  BASIC METABOLIC PANEL - Abnormal; Notable for the following components:      Result Value   Glucose, Bld 137 (*)    Calcium 8.8 (*)  All other components within normal limits  CBC WITH DIFFERENTIAL/PLATELET - Abnormal; Notable for the following components:   Hemoglobin 17.1 (*)    All other components within normal limits  CBG MONITORING, ED - Abnormal; Notable for the following components:   Glucose-Capillary 143 (*)    All other components within normal limits  CBG MONITORING, ED - Abnormal; Notable for the following components:   Glucose-Capillary 128 (*)    All other components within normal limits  TROPONIN I (HIGH SENSITIVITY)  TROPONIN I (HIGH SENSITIVITY)    EKG EKG Interpretation  Date/Time:  Thursday September 16 2020 17:21:05 EDT Ventricular Rate:  85 PR Interval:  144 QRS Duration: 110 QT Interval:  391 QTC Calculation: 465 R Axis:   1 Text Interpretation: Sinus rhythm No significant change since last tracing Confirmed by Isla Pence [id number]) on 09/16/2020 6:15:43 PM   Radiology DG Chest 2 View  Result Date: 09/16/2020 CLINICAL DATA:  Syncope EXAM: CHEST - 2 VIEW COMPARISON:  09/12/2020 FINDINGS: Surgical hardware in the cervical spine. No focal opacity or pleural effusion. Stable enlarged cardiomediastinal silhouette. No pneumothorax. IMPRESSION: No active cardiopulmonary disease. Cardiomegaly. Electronically Signed   By: Donavan Foil M.D.   On: 09/16/2020 18:20    Procedures Procedures  (including critical care time)  Medications Ordered in ED Medications - No data to display  ED Course  I have reviewed the triage vital signs and the nursing notes.  Pertinent labs & imaging results that were available during my care of the patient were reviewed by me and considered in my medical decision making (see chart for details).    MDM Rules/Calculators/A&P                          Patient presenting to the ED for syncopal episode.  Patient states he was sitting in his chair, had a coughing fit, and when he leaned over to spit out his sputum he syncopized.  This is not the first occurrence, and he was worked up in the inpatient setting just a few days ago during an admission for hypoxia due to RSV.  His breathing is improved.  He has had similar episodes of syncope in the past though states today was different because he was sitting when it happened.  His prior episodes of syncope were with position changes, mostly bending forward or going from sitting to standing.  He has outpatient Holter monitor arranged as well as CT of his coronaries scheduled outpatient for further evaluation of his recurrent syncope.  He is currently asymptomatic in the ED, no acute distress is noted. VS are stable, no hypotension or tachycardia. No arrhythmia on ECG.   Labs are unremarkable, troponins are negative.  Chest x-ray is negative.  He is ambulating in the ED without recurrence of lightheadedness or near syncope.  Patient is instructed to follow closely outpatient.  Do not believe there is any other emergent intervention necessary at this time, patient can continue to be managed outpatient.  Appropriate for discharge.  Discussed results, findings, treatment and follow up. Patient advised of return precautions. Patient verbalized understanding and agreed with plan.   Final Clinical Impression(s) / ED Diagnoses Final diagnoses:  Syncope and collapse    Rx / DC Orders ED Discharge Orders    None        Armanie Ullmer, [place] N, PA-C 09/16/20 2126    Isla Pence, MD 09/16/20 2136

## 2020-09-16 NOTE — ED Triage Notes (Signed)
Pt here with c/o syncopal episode after bending over to spit , wife told ems that he was out for approx 2 minutes , pt awake and alert on arrival

## 2020-09-16 NOTE — ED Notes (Signed)
Stuck pt for lab draw X3 with no success RN aware

## 2020-09-16 NOTE — Discharge Instructions (Addendum)
Please follow closely with your primary care provider and cardiologist regarding your visit today. Please stay hydrated. Be cautious when you are changing positions, move slowly to prevent recurrent fainting. Avoid driving until cleared by your doctor.

## 2020-09-16 NOTE — ED Notes (Signed)
Pt's daughter notified on Pt's status and plan of care.

## 2020-09-16 NOTE — ED Notes (Signed)
Larry [name]  daug [phone] call with an update

## 2020-09-17 LAB — CULTURE, BLOOD (ROUTINE X 2)
Culture: NO GROWTH
Culture: NO GROWTH
Special Requests: ADEQUATE

## 2020-09-22 ENCOUNTER — Ambulatory Visit (INDEPENDENT_AMBULATORY_CARE_PROVIDER_SITE_OTHER): Payer: Medicare HMO

## 2020-09-22 DIAGNOSIS — R55 Syncope and collapse: Secondary | ICD-10-CM | POA: Diagnosis not present

## 2020-09-24 ENCOUNTER — Other Ambulatory Visit (HOSPITAL_COMMUNITY): Payer: Self-pay | Admitting: Emergency Medicine

## 2020-09-24 ENCOUNTER — Telehealth (HOSPITAL_COMMUNITY): Payer: Self-pay | Admitting: Emergency Medicine

## 2020-09-24 MED ORDER — IVABRADINE HCL 5 MG PO TABS: 10.0000 mg | ORAL_TABLET | Freq: Once | ORAL | 0 refills | Status: AC

## 2020-09-24 NOTE — Telephone Encounter (Signed)
Reaching out to patient to offer assistance regarding upcoming cardiac imaging study; pt verbalizes understanding of appt date/time, parking situation and where to check in, pre-test NPO status and medications ordered, and verified current allergies; name and call back number provided for further questions should they arise Rockwell Alexandria RN Navigator Cardiac Imaging [facility] [phone] office [phone] cell    Pt with hx orthostatic hypotension requiring midodrine. Per turner, cannot tolerate BB.  10mg  ivabradine ordered for HR control. pts wife verbalized understanding

## 2020-09-24 NOTE — Progress Notes (Signed)
Pt scheduled for CTA. Resting HR 85 and hx of orthostatic hypotension on midodrine.  Order for Ivabradine for HR control requested. Pt intolerant of BB.   Rockwell Alexandria RN Navigator Cardiac Imaging [facility] [phone] Office  [phone] Cell

## 2020-09-27 ENCOUNTER — Ambulatory Visit (HOSPITAL_COMMUNITY)
Admission: RE | Admit: 2020-09-27 | Discharge: 2020-09-27 | Disposition: A | Discharge status: Home / Self Care | Payer: Medicare HMO | Source: Ambulatory Visit | Attending: Student | Admitting: Student

## 2020-09-27 ENCOUNTER — Other Ambulatory Visit: Payer: Self-pay

## 2020-09-27 ENCOUNTER — Encounter (HOSPITAL_COMMUNITY): Payer: Self-pay

## 2020-09-27 DIAGNOSIS — I2582 Chronic total occlusion of coronary artery: Secondary | ICD-10-CM | POA: Diagnosis not present

## 2020-09-27 DIAGNOSIS — I2510 Atherosclerotic heart disease of native coronary artery without angina pectoris: Secondary | ICD-10-CM | POA: Diagnosis not present

## 2020-09-27 DIAGNOSIS — I429 Cardiomyopathy, unspecified: Secondary | ICD-10-CM | POA: Diagnosis not present

## 2020-09-27 MED ORDER — NITROGLYCERIN 0.4 MG SL SUBL
0.8000 mg | SUBLINGUAL_TABLET | Freq: Once | SUBLINGUAL | Status: AC
  Administered 2020-09-27: 0.8 mg via SUBLINGUAL

## 2020-09-27 MED ORDER — IOHEXOL 350 MG/ML SOLN
80.0000 mL | Freq: Once | INTRAVENOUS | Status: AC | PRN
  Administered 2020-09-27: 80 mL via INTRAVENOUS

## 2020-09-27 MED ORDER — NITROGLYCERIN 0.4 MG SL SUBL
SUBLINGUAL_TABLET | SUBLINGUAL | Status: AC
  Filled 2020-09-27: qty 1

## 2020-09-29 ENCOUNTER — Telehealth: Payer: Self-pay | Admitting: Medical

## 2020-09-29 MED ORDER — NITROGLYCERIN 0.4 MG SL SUBL: 0.4000 mg | SUBLINGUAL_TABLET | SUBLINGUAL | 3 refills | Status: DC | PRN

## 2020-09-29 NOTE — Telephone Encounter (Signed)
° °  Called patient to discuss Coronary CTA results suggestive of moderate-severe CAD. Patient does reports some intermittent chest pain and DOE which have been relieved with rest. Currently chest pain free. I informed him that to better assess his symptoms and coordinate additional testing he will need to be seen in our office. I have arranged a follow-up visit with Nada Boozer, NP 10/01/20 at 3:15pm.   I encouraged him to start aspirin 81mg  daily. Will send an Rx for SL nitro prn - administration instructions reviewed. ED precautions reviewed as well. Patient and wife were in agreement with the plan and appreciative of the call.  , PA-C 09/29/20; 3:27 PM

## 2020-10-01 ENCOUNTER — Ambulatory Visit: Payer: Medicare HMO | Admitting: Cardiology

## 2020-10-01 ENCOUNTER — Encounter: Payer: Self-pay | Admitting: Cardiology

## 2020-10-01 ENCOUNTER — Other Ambulatory Visit: Payer: Self-pay

## 2020-10-01 VITALS — BP 112/76 | HR 78 | Ht 69.0 in | Wt 248.0 lb

## 2020-10-01 DIAGNOSIS — R931 Abnormal findings on diagnostic imaging of heart and coronary circulation: Secondary | ICD-10-CM | POA: Diagnosis not present

## 2020-10-01 DIAGNOSIS — I209 Angina pectoris, unspecified: Secondary | ICD-10-CM | POA: Diagnosis not present

## 2020-10-01 DIAGNOSIS — G35 Multiple sclerosis: Secondary | ICD-10-CM

## 2020-10-01 DIAGNOSIS — E118 Type 2 diabetes mellitus with unspecified complications: Secondary | ICD-10-CM

## 2020-10-01 DIAGNOSIS — G4733 Obstructive sleep apnea (adult) (pediatric): Secondary | ICD-10-CM

## 2020-10-01 DIAGNOSIS — Z720 Tobacco use: Secondary | ICD-10-CM

## 2020-10-01 DIAGNOSIS — I429 Cardiomyopathy, unspecified: Secondary | ICD-10-CM | POA: Diagnosis not present

## 2020-10-01 DIAGNOSIS — G35D Multiple sclerosis, unspecified: Secondary | ICD-10-CM

## 2020-10-01 DIAGNOSIS — E039 Hypothyroidism, unspecified: Secondary | ICD-10-CM

## 2020-10-01 DIAGNOSIS — R55 Syncope and collapse: Secondary | ICD-10-CM | POA: Diagnosis not present

## 2020-10-01 DIAGNOSIS — E782 Mixed hyperlipidemia: Secondary | ICD-10-CM

## 2020-10-01 NOTE — Progress Notes (Signed)
Cardiology Office Note   Date:  10/01/2020   ID:  Larry [name], DOB [birth year], MRN [id number]  PCP:  Roberts Gaudy., MD  Cardiologist:  Dr. Mayford Knife    Chief Complaint  Patient presents with  . Hospitalization Follow-up      History of Present Illness: Larry [name] is a 59 y.o. male who presents for hospitalization and abnormal cardiac CTA.  Hx of MS, OSA on CPAP, peripheral neuropathy HTN, HLD, DM-2 tobacco use, and recent RSV.   Hx of orthostatic hypotension.    Echo was ordered and showed LVEF of 40-45% with global hypokinesis slightly worse in the septum as well as mild grade 1 diastolic dysfunction. RV was normal but also had mildly reduced systolic function  Was seen in hospital for syncope, and hx of recurrent syncope with no prodroma symptoms. No arrhythmias on tele.  He was seen and evaluated by Dr. 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    Symptoms of DOE and exertional fatigue for the past year and has several CRFs including ongoing tobacco use, HTN, HLD, DM2 and fm hx of CAD (although at later age in life). He was started on losartan.  After discharge he presented with syncope.  BP was stable. Last orthostatic BP was stable.    Cardiac CTA is positive. Total RCA,  Reconstitutes distally from collateral flow.  LAD with 50-69% stenosis, CT FFR across lesion in .61 plaque has high risk features.   LCX non dominant mid LCX with 50-69% stenosis, CT FFR 0.76  Cardiac cath recommended.    Today pt continues with ++SOB with any exertion.  Has diaphoretic episodes and some chest pressure.  He is wearing 30 day monitor now.  No further syncope though if he does much activity he is SOB and like he may pass out.    Discussed at length cardiac cath, reason for recommendation. Risk and answered questions.      Past  Medical History:  Diagnosis Date  . Arthritis   . Nerve pain     History reviewed. No pertinent surgical history.   Current Outpatient Medications  Medication Sig Dispense Refill  . albuterol (VENTOLIN HFA) 108 (90 Base) MCG/ACT inhaler Inhale 2 puffs into the lungs every 6 (six) hours as needed for wheezing or shortness of breath. 8 g 2  . aspirin EC 81 MG tablet Take 81 mg by mouth daily. Swallow whole.    Marland Kitchen atorvastatin (LIPITOR) 10 MG tablet Take 10 mg by mouth daily.    Marland Kitchen buPROPion (WELLBUTRIN XL) 150 MG 24 hr tablet Take 150 mg by mouth daily.    . Diroximel Fumarate (VUMERITY) 231 MG CPDR Take 2 capsules by mouth in the morning and at bedtime.    . DULoxetine (CYMBALTA) 60 MG capsule Take 60 mg by mouth daily.  5  . levothyroxine (SYNTHROID) 50 MCG tablet Take 50 mcg by mouth daily before breakfast.    . losartan (COZAAR) 25 MG tablet Take 0.5 tablets (12.5 mg total) by mouth daily. 30 tablet 3  . methocarbamol (ROBAXIN) 750 MG tablet Take 750 mg by mouth 3 (three) times daily.    . midodrine (PROAMATINE) 5 MG tablet Take 5 mg by mouth daily.    . Multiple Vitamin (MULTIVITAMIN) tablet Take 1 tablet by mouth daily.    . nitroGLYCERIN (NITROSTAT)  0.4 MG SL tablet Place 1 tablet (0.4 mg total) under the tongue every 5 (five) minutes as needed for chest pain. 25 tablet 3  . omeprazole (PRILOSEC) 40 MG capsule Take 40 mg by mouth daily.    . pregabalin (LYRICA) 150 MG capsule Take 150 mg by mouth in the morning, at noon, and at bedtime.    Marland Kitchen zolpidem (AMBIEN) 10 MG tablet Take 10 mg by mouth at bedtime as needed for sleep.     No current facility-administered medications for this visit.    Allergies:   Aspirin    Social History:  The patient  reports that he has been smoking cigarettes. He has never used smokeless tobacco. He reports that he does not drink alcohol and does not use drugs.   Family History:  The patient's family history includes Heart attack (age of onset: 79) in  his mother.    ROS:  General:no colds or fevers, no weight changes Skin:no rashes or ulcers HEENT:no blurred vision, no congestion CV:see HPI PUL:see HPI GI:no diarrhea constipation or melena, no indigestion GU:no hematuria, no dysuria MS:no joint pain, no claudication Neuro:no syncope, no lightheadedness Endo:no diabetes, no thyroid disease  Wt Readings from Last 3 Encounters:  10/01/20 248 lb (112.5 kg)  09/12/20 215 lb 9.6 oz (97.8 kg)  08/28/17 215 lb 9.8 oz (97.8 kg)     PHYSICAL EXAM: VS:  BP 112/76   Pulse 78   Ht 5\' 9"  (1.753 m)   Wt 248 lb (112.5 kg)   SpO2 96%   BMI 36.62 kg/m  , BMI Body mass index is 36.62 kg/m. General:Pleasant affect, NAD Skin:Warm and dry, brisk capillary refill HEENT:normocephalic, sclera clear, mucus membranes moist Neck:supple, no JVD, no bruits  Heart:S1S2 RRR without murmur, gallup, rub or click Lungs:clear without rales, rhonchi, or wheezes , non tender, + BS, do not palpate liver spleen or masses Ext:no lower ext edema, 2+ pedal pulses, 2+ radial pulses Neuro:alert and oriented, MAE, follows commands, + facial symmetry    EKG:  EKG is NOT ordered today. The ekg ordered today demonstrates none today     Recent Labs: 09/12/2020: B Natriuretic Peptide 370.0 09/13/2020: ALT 48; Magnesium 2.4 09/16/2020: BUN 9; Creatinine, Ser 1.22; Hemoglobin 17.1; Platelets 170; Potassium 3.7; Sodium 139    Lipid Panel No results found for: CHOL, TRIG, HDL, CHOLHDL, VLDL, LDLCALC, LDLDIRECT     Other studies Reviewed: Additional studies/ records that were reviewed today include: . Echo 09/12/20 IMPRESSIONS    1. Poor image quality even with administration of Definity contrast.  There appears to be global hypokinesis slightly worse in the septum.. Left  ventricular ejection fraction, by estimation, is 40 to 45%. The left  ventricle has mildly decreased function.  The left ventricle demonstrates regional wall motion  abnormalities (see  scoring diagram/findings for description). There is mild concentric left  ventricular hypertrophy. Left ventricular diastolic parameters are  consistent with Grade I diastolic  dysfunction (impaired relaxation).  2. Right ventricular systolic function is mildly reduced. The right  ventricular size is normal.  3. The mitral valve is normal in structure. No evidence of mitral valve  regurgitation. No evidence of mitral stenosis.  4. The aortic valve is normal in structure. Aortic valve regurgitation is  not visualized. No aortic stenosis is present.  5. The inferior vena cava is normal in size with greater than 50%  respiratory variability, suggesting right atrial pressure of 3 mmHg.   FINDINGS  Left Ventricle: Poor image quality  even with administration of Definity  contrast. There appears to be global hypokinesis slightly worse in the  septum. Left ventricular ejection fraction, by estimation, is 40 to 45%.  The left ventricle has mildly  decreased function. The left ventricle demonstrates regional wall motion  abnormalities. Definity contrast agent was given IV to delineate the left  ventricular endocardial borders. The left ventricular internal cavity size  was normal in size. There is  mild concentric left ventricular hypertrophy. Left ventricular diastolic  parameters are consistent with Grade I diastolic dysfunction (impaired  relaxation). Normal left ventricular filling pressure.   Right Ventricle: The right ventricular size is normal. No increase in  right ventricular wall thickness. Right ventricular systolic function is  mildly reduced.   Left Atrium: Left atrial size was normal in size.   Right Atrium: Right atrial size was normal in size.   Pericardium: There is no evidence of pericardial effusion.   Mitral Valve: The mitral valve is normal in structure. No evidence of  mitral valve regurgitation. No evidence of mitral valve stenosis.    Tricuspid Valve: The tricuspid valve is normal in structure. Tricuspid  valve regurgitation is trivial. No evidence of tricuspid stenosis.   Aortic Valve: The aortic valve is normal in structure. Aortic valve  regurgitation is not visualized. No aortic stenosis is present.   Pulmonic Valve: The pulmonic valve was normal in structure. Pulmonic valve  regurgitation is not visualized. No evidence of pulmonic stenosis.   Aorta: The aortic root is normal in size and structure.   Venous: The inferior vena cava is normal in size with greater than 50%  respiratory variability, suggesting right atrial pressure of 3 mmHg.   IAS/Shunts: No atrial level shunt detected by color flow Doppler.     LEFT VENTRICLE  PLAX 2D  LVIDd:     5.57 cm Diastology  LVIDs:     4.90 cm LV e' medial:  5.11 cm/s  LV PW:     1.15 cm LV E/e' medial: 8.2  LV IVS:    1.15 cm LV e' lateral:  8.38 cm/s  LVOT diam:   2.25 cm LV E/e' lateral: 5.0  LV SV:     63  LV SV Index:  29  LVOT Area:   3.98 cm     RIGHT VENTRICLE  RV S prime:   11.00 cm/s  TAPSE (M-mode): 1.4 cm   LEFT ATRIUM       Index    RIGHT ATRIUM      Index  LA diam:    3.90 cm 1.83 cm/m RA Area:   10.40 cm  LA Vol (A2C):  29.7 ml 13.92 ml/m RA Volume:  17.90 ml 8.39 ml/m  LA Vol (A4C):  13.1 ml 6.14 ml/m  LA Biplane Vol: 18.5 ml 8.67 ml/m  AORTIC VALVE  LVOT Vmax:  88.80 cm/s  LVOT Vmean: 64.700 cm/s  LVOT VTI:  0.158 m    AORTA  Ao Root diam: 3.90 cm    Cardiac CTA 09/27/20 Coronary Arteries:  Normal coronary origin.  Right dominance.  RCA is a large dominant artery. Total occlusion in proximal RCA. Reconstitutes distally from collateral flow  Left main is a large artery that gives rise to LAD and LCX arteries.  LAD is a large vessel. There is mixed plaque in the mid LAD causing 50-69% stenosis. CT FFR across lesion is 0.61. Plaque has high  risk features, including low attenuation, positive remodeling, and spotty calcifications.  LCX is a non-dominant  artery. Calcified plaque in the proximal LCX causes 0-24% stenosis. Noncalcified plaque in mid LCX causes 50 to 69% stenosis. CT FFR across lesion is 0.76. Vessel is small, ~49mm  Other findings:  Left Ventricle: Mild dilatation  Left Atrium: Normal size  Pulmonary Veins: Normal configuration  Right Ventricle: Mild dilatation  Right Atrium: Normal size  Cardiac valves: No calcifications  Thoracic aorta: Normal size  Pulmonary Arteries: Normal size  Systemic Veins: Normal drainage  Pericardium: Normal thickness  IMPRESSION: 1. Coronary calcium score of 109. This was 74th percentile for age and sex matched control.  2.  Multivessel obstructive CAD  3. Mixed plaque in mid LAD causes moderate (50-69%) stenosis. CT FFR across lesion is 0.61, suggesting lesion is functionally significant. Plaque has high risk features, including low attenuation, positive remodeling, and spotty calcifications.  4. Total occlusion in proximal RCA. Reconstitutes distally from collateral flow  5. Noncalcified plaque in mid LCX causes moderate (50-69%) stenosis. CT FFR across lesion is 0.76, suggesting lesion is functionally significant. However LCX is small vessel, with diameter ~34mm.  CAD-RADS 4 Severe stenosis. (70-99%). Cardiac catheterization is recommended. Consider symptom-guided anti-ischemic pharmacotherapy as well as risk factor modification per guideline directed care.  ASSESSMENT AND PLAN:  1.  Anginal symptoms of significant SOB with exertion.  Lungs clear on exam. Risk for CAD with DM, Tobacco use, HLD, and abnormal CTA with total RCA collateral circulation from LAD with LAD obstruction.   Discussed cardiac cath and possible outcomes.  He is on ASA 81 mg daily, and statin.  No BB with syncope   The patient understands that risks included but are  not limited to stroke (1 in 1000), death (1 in 1000), kidney failure [usually temporary] (1 in 500), bleeding (1 in 200), allergic reaction [possibly serious] (1 in 200).  Plan for cath on Tuesday with Dr. Eldridge Dace.  2.  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  HLD on statin continue and may need higher dose  4.  Multiple sclerosis per Neuro  5.  Hypothyroid - per IM hx of night sweats   6.  OSA on CPAP with nasal pillow  7.  Orthostatic hypotension- with syncope had a year ago and now syncope has returned. Wearing 30 day event monitor.   (neg chest CTA for PE)  8.  Recent RSV infection now resolved  9. Tobacco use, instructed to stop   Review of hospitalizations, ER visit, echo and cardiac CTA   Current medicines are reviewed with the patient today.  The patient Has no concerns regarding medicines.  The following changes have been made:  See above Labs/ tests ordered today include:see above  Disposition:   FU:  see above  Signed, Nada Boozer, NP  10/01/2020 3:28 PM    [facility] [address] [place], [place], [place]  [address]/ [address] [place], [place] Phone: [phone]; Fax: [phone]  [phone]

## 2020-10-01 NOTE — Patient Instructions (Addendum)
Medication Instructions:  Your physician recommends that you continue on your current medications as directed. Please refer to the Current Medication list given to you today.  *If you need a refill on your cardiac medications before your next appointment, please call your pharmacy*   Lab Work: TODAY:  BMET & CBC  If you have labs (blood work) drawn today and your tests are completely normal, you will receive your results only by: Marland Kitchen MyChart Message (if you have MyChart) OR . A paper copy in the mail If you have any lab test that is abnormal or we need to change your treatment, we will call you to review the results.   Testing/Procedures: Your physician has requested that you have a cardiac catheterization. Cardiac catheterization is used to diagnose and/or treat various heart conditions. Doctors may recommend this procedure for a number of different reasons. The most common reason is to evaluate chest pain. Chest pain can be a symptom of coronary artery disease (CAD), and cardiac catheterization can show whether plaque is narrowing or blocking your heart's arteries. This procedure is also used to evaluate the valves, as well as measure the blood flow and oxygen levels in different parts of your heart. For further information please visit [email or web address] Please follow instruction sheet, BELOW:     [facility] CARDIOVASCULAR DIVISION [facility] OFFICE [address] [place], [address] [place] [address] Dept: [phone] Loc: [phone]  Larry [name]  10/01/2020  You are scheduled for a Cardiac Catheterization on Tuesday, October 19 with Dr. Lance Muss.  COVID TESTING:    Due to recent COVID-19 restrictions implemented by our local and state authorities and in an effort to keep both patients and staff as safe as possible, our hospital system requires COVID-19 testing prior to certain scheduled hospital procedures.  Please go to [phone]  [facility] [place], [place] [address] on 09/02/20 at 11:45 am  .  This is a drive up testing site.  You will not need to exit your vehicle.  You will not be billed at the time of testing but may receive a bill later depending on your insurance. You must agree to self-quarantine from the time of your testing until the procedure date on 09/05/20.  This should included staying home with ONLY the people you live with.  Avoid take-out, grocery store shopping or leaving the house for any non-emergent reason.  Failure to have your COVID-19 test done on the date and time you have been scheduled will result in cancellation of your procedure.  Please call our office at [phone] if you have any questions.   1. Please arrive at the [facility] (Main Entrance A) at [facility]: [address] [place], [place] [address] at 7:00 AM (This time is two hours before your procedure to ensure your preparation). Free valet parking service is available.   Special note: Every effort is made to have your procedure done on time. Please understand that emergencies sometimes delay scheduled procedures.  2. Diet: Do not eat solid foods after midnight.  The patient may have clear liquids until 5am upon the day of the procedure.  3. Labs: You will need to have blood drawn on TODAY   4. Medication instructions in preparation for your procedure:   Contrast Allergy: No  DO NOT TAKE THE LOSARTAN ON THE MORNING OF YOUR CATH   On the morning of your procedure, take your Aspirin and any morning medicines NOT listed above.  You may use sips of water.  5. Plan for one night stay--bring personal belongings. 6. Bring a current list of your medications and current insurance cards. 7. You MUST have a responsible person to drive you home. 8. Someone MUST be with you the first 24 hours after you arrive home or your discharge will be delayed. 9. Please wear clothes that are easy to get on and off and wear slip-on  shoes.  Thank you for allowing [place] to care for you!   -- [name]       Follow-Up: At [facility], you and your health needs are our priority.  As part of our continuing mission to provide you with exceptional heart care, we have created designated Provider Care Teams.  These Care Teams include your primary Cardiologist (physician) and Advanced Practice Providers (APPs -  Physician Assistants and Nurse Practitioners) who all work together to provide you with the care you need, when you need it.  We recommend signing up for the patient portal called "MyChart".  Sign up information is provided on this After Visit Summary.  MyChart is used to connect with patients for Virtual Visits (Telemedicine).  Patients are able to view lab/test results, encounter notes, upcoming appointments, etc.  Non-urgent messages can be sent to your provider as well.   To learn more about what you can do with MyChart, go to [email or web address].    Your next appointment:   2 week(s)  The format for your next appointment:   In Person  Provider:   You may see Armanda Magic, MD or one of the following Advanced Practice Providers on your designated Care Team:    Ronie Spies, PA-C  Jacolyn Reedy, PA-C    Other Instructions

## 2020-10-01 NOTE — H&P (View-Only) (Signed)
Cardiology Office Note   Date:  10/01/2020   ID:  Larry [name], DOB [birth year], MRN [id number]  PCP:  Roberts Gaudy., MD  Cardiologist:  Dr. Mayford Knife    Chief Complaint  Patient presents with  . Hospitalization Follow-up      History of Present Illness: Larry [name] is a 59 y.o. male who presents for hospitalization and abnormal cardiac CTA.  Hx of MS, OSA on CPAP, peripheral neuropathy HTN, HLD, DM-2 tobacco use, and recent RSV.   Hx of orthostatic hypotension.    Echo was ordered and showed LVEF of 40-45% with global hypokinesis slightly worse in the septum as well as mild grade 1 diastolic dysfunction. RV was normal but also had mildly reduced systolic function  Was seen in hospital for syncope, and hx of recurrent syncope with no prodroma symptoms. No arrhythmias on tele.  He was seen and evaluated by Dr. 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    Symptoms of DOE and exertional fatigue for the past year and has several CRFs including ongoing tobacco use, HTN, HLD, DM2 and fm hx of CAD (although at later age in life). He was started on losartan.  After discharge he presented with syncope.  BP was stable. Last orthostatic BP was stable.    Cardiac CTA is positive. Total RCA,  Reconstitutes distally from collateral flow.  LAD with 50-69% stenosis, CT FFR across lesion in .61 plaque has high risk features.   LCX non dominant mid LCX with 50-69% stenosis, CT FFR 0.76  Cardiac cath recommended.    Today pt continues with ++SOB with any exertion.  Has diaphoretic episodes and some chest pressure.  He is wearing 30 day monitor now.  No further syncope though if he does much activity he is SOB and like he may pass out.    Discussed at length cardiac cath, reason for recommendation. Risk and answered questions.      Past  Medical History:  Diagnosis Date  . Arthritis   . Nerve pain     History reviewed. No pertinent surgical history.   Current Outpatient Medications  Medication Sig Dispense Refill  . albuterol (VENTOLIN HFA) 108 (90 Base) MCG/ACT inhaler Inhale 2 puffs into the lungs every 6 (six) hours as needed for wheezing or shortness of breath. 8 g 2  . aspirin EC 81 MG tablet Take 81 mg by mouth daily. Swallow whole.    Marland Kitchen atorvastatin (LIPITOR) 10 MG tablet Take 10 mg by mouth daily.    Marland Kitchen buPROPion (WELLBUTRIN XL) 150 MG 24 hr tablet Take 150 mg by mouth daily.    . Diroximel Fumarate (VUMERITY) 231 MG CPDR Take 2 capsules by mouth in the morning and at bedtime.    . DULoxetine (CYMBALTA) 60 MG capsule Take 60 mg by mouth daily.  5  . levothyroxine (SYNTHROID) 50 MCG tablet Take 50 mcg by mouth daily before breakfast.    . losartan (COZAAR) 25 MG tablet Take 0.5 tablets (12.5 mg total) by mouth daily. 30 tablet 3  . methocarbamol (ROBAXIN) 750 MG tablet Take 750 mg by mouth 3 (three) times daily.    . midodrine (PROAMATINE) 5 MG tablet Take 5 mg by mouth daily.    . Multiple Vitamin (MULTIVITAMIN) tablet Take 1 tablet by mouth daily.    . nitroGLYCERIN (NITROSTAT)  0.4 MG SL tablet Place 1 tablet (0.4 mg total) under the tongue every 5 (five) minutes as needed for chest pain. 25 tablet 3  . omeprazole (PRILOSEC) 40 MG capsule Take 40 mg by mouth daily.    . pregabalin (LYRICA) 150 MG capsule Take 150 mg by mouth in the morning, at noon, and at bedtime.    Marland Kitchen zolpidem (AMBIEN) 10 MG tablet Take 10 mg by mouth at bedtime as needed for sleep.     No current facility-administered medications for this visit.    Allergies:   Aspirin    Social History:  The patient  reports that he has been smoking cigarettes. He has never used smokeless tobacco. He reports that he does not drink alcohol and does not use drugs.   Family History:  The patient's family history includes Heart attack (age of onset: 79) in  his mother.    ROS:  General:no colds or fevers, no weight changes Skin:no rashes or ulcers HEENT:no blurred vision, no congestion CV:see HPI PUL:see HPI GI:no diarrhea constipation or melena, no indigestion GU:no hematuria, no dysuria MS:no joint pain, no claudication Neuro:no syncope, no lightheadedness Endo:no diabetes, no thyroid disease  Wt Readings from Last 3 Encounters:  10/01/20 248 lb (112.5 kg)  09/12/20 215 lb 9.6 oz (97.8 kg)  08/28/17 215 lb 9.8 oz (97.8 kg)     PHYSICAL EXAM: VS:  BP 112/76   Pulse 78   Ht 5\' 9"  (1.753 m)   Wt 248 lb (112.5 kg)   SpO2 96%   BMI 36.62 kg/m  , BMI Body mass index is 36.62 kg/m. General:Pleasant affect, NAD Skin:Warm and dry, brisk capillary refill HEENT:normocephalic, sclera clear, mucus membranes moist Neck:supple, no JVD, no bruits  Heart:S1S2 RRR without murmur, gallup, rub or click Lungs:clear without rales, rhonchi, or wheezes , non tender, + BS, do not palpate liver spleen or masses Ext:no lower ext edema, 2+ pedal pulses, 2+ radial pulses Neuro:alert and oriented, MAE, follows commands, + facial symmetry    EKG:  EKG is NOT ordered today. The ekg ordered today demonstrates none today     Recent Labs: 09/12/2020: B Natriuretic Peptide 370.0 09/13/2020: ALT 48; Magnesium 2.4 09/16/2020: BUN 9; Creatinine, Ser 1.22; Hemoglobin 17.1; Platelets 170; Potassium 3.7; Sodium 139    Lipid Panel No results found for: CHOL, TRIG, HDL, CHOLHDL, VLDL, LDLCALC, LDLDIRECT     Other studies Reviewed: Additional studies/ records that were reviewed today include: . Echo 09/12/20 IMPRESSIONS    1. Poor image quality even with administration of Definity contrast.  There appears to be global hypokinesis slightly worse in the septum.. Left  ventricular ejection fraction, by estimation, is 40 to 45%. The left  ventricle has mildly decreased function.  The left ventricle demonstrates regional wall motion  abnormalities (see  scoring diagram/findings for description). There is mild concentric left  ventricular hypertrophy. Left ventricular diastolic parameters are  consistent with Grade I diastolic  dysfunction (impaired relaxation).  2. Right ventricular systolic function is mildly reduced. The right  ventricular size is normal.  3. The mitral valve is normal in structure. No evidence of mitral valve  regurgitation. No evidence of mitral stenosis.  4. The aortic valve is normal in structure. Aortic valve regurgitation is  not visualized. No aortic stenosis is present.  5. The inferior vena cava is normal in size with greater than 50%  respiratory variability, suggesting right atrial pressure of 3 mmHg.   FINDINGS  Left Ventricle: Poor image quality  even with administration of Definity  contrast. There appears to be global hypokinesis slightly worse in the  septum. Left ventricular ejection fraction, by estimation, is 40 to 45%.  The left ventricle has mildly  decreased function. The left ventricle demonstrates regional wall motion  abnormalities. Definity contrast agent was given IV to delineate the left  ventricular endocardial borders. The left ventricular internal cavity size  was normal in size. There is  mild concentric left ventricular hypertrophy. Left ventricular diastolic  parameters are consistent with Grade I diastolic dysfunction (impaired  relaxation). Normal left ventricular filling pressure.   Right Ventricle: The right ventricular size is normal. No increase in  right ventricular wall thickness. Right ventricular systolic function is  mildly reduced.   Left Atrium: Left atrial size was normal in size.   Right Atrium: Right atrial size was normal in size.   Pericardium: There is no evidence of pericardial effusion.   Mitral Valve: The mitral valve is normal in structure. No evidence of  mitral valve regurgitation. No evidence of mitral valve stenosis.    Tricuspid Valve: The tricuspid valve is normal in structure. Tricuspid  valve regurgitation is trivial. No evidence of tricuspid stenosis.   Aortic Valve: The aortic valve is normal in structure. Aortic valve  regurgitation is not visualized. No aortic stenosis is present.   Pulmonic Valve: The pulmonic valve was normal in structure. Pulmonic valve  regurgitation is not visualized. No evidence of pulmonic stenosis.   Aorta: The aortic root is normal in size and structure.   Venous: The inferior vena cava is normal in size with greater than 50%  respiratory variability, suggesting right atrial pressure of 3 mmHg.   IAS/Shunts: No atrial level shunt detected by color flow Doppler.     LEFT VENTRICLE  PLAX 2D  LVIDd:     5.57 cm Diastology  LVIDs:     4.90 cm LV e' medial:  5.11 cm/s  LV PW:     1.15 cm LV E/e' medial: 8.2  LV IVS:    1.15 cm LV e' lateral:  8.38 cm/s  LVOT diam:   2.25 cm LV E/e' lateral: 5.0  LV SV:     63  LV SV Index:  29  LVOT Area:   3.98 cm     RIGHT VENTRICLE  RV S prime:   11.00 cm/s  TAPSE (M-mode): 1.4 cm   LEFT ATRIUM       Index    RIGHT ATRIUM      Index  LA diam:    3.90 cm 1.83 cm/m RA Area:   10.40 cm  LA Vol (A2C):  29.7 ml 13.92 ml/m RA Volume:  17.90 ml 8.39 ml/m  LA Vol (A4C):  13.1 ml 6.14 ml/m  LA Biplane Vol: 18.5 ml 8.67 ml/m  AORTIC VALVE  LVOT Vmax:  88.80 cm/s  LVOT Vmean: 64.700 cm/s  LVOT VTI:  0.158 m    AORTA  Ao Root diam: 3.90 cm    Cardiac CTA 09/27/20 Coronary Arteries:  Normal coronary origin.  Right dominance.  RCA is a large dominant artery. Total occlusion in proximal RCA. Reconstitutes distally from collateral flow  Left main is a large artery that gives rise to LAD and LCX arteries.  LAD is a large vessel. There is mixed plaque in the mid LAD causing 50-69% stenosis. CT FFR across lesion is 0.61. Plaque has high  risk features, including low attenuation, positive remodeling, and spotty calcifications.  LCX is a non-dominant  artery. Calcified plaque in the proximal LCX causes 0-24% stenosis. Noncalcified plaque in mid LCX causes 50 to 69% stenosis. CT FFR across lesion is 0.76. Vessel is small, ~2mm  Other findings:  Left Ventricle: Mild dilatation  Left Atrium: Normal size  Pulmonary Veins: Normal configuration  Right Ventricle: Mild dilatation  Right Atrium: Normal size  Cardiac valves: No calcifications  Thoracic aorta: Normal size  Pulmonary Arteries: Normal size  Systemic Veins: Normal drainage  Pericardium: Normal thickness  IMPRESSION: 1. Coronary calcium score of 109. This was 74th percentile for age and sex matched control.  2.  Multivessel obstructive CAD  3. Mixed plaque in mid LAD causes moderate (50-69%) stenosis. CT FFR across lesion is 0.61, suggesting lesion is functionally significant. Plaque has high risk features, including low attenuation, positive remodeling, and spotty calcifications.  4. Total occlusion in proximal RCA. Reconstitutes distally from collateral flow  5. Noncalcified plaque in mid LCX causes moderate (50-69%) stenosis. CT FFR across lesion is 0.76, suggesting lesion is functionally significant. However LCX is small vessel, with diameter ~2mm.  CAD-RADS 4 Severe stenosis. (70-99%). Cardiac catheterization is recommended. Consider symptom-guided anti-ischemic pharmacotherapy as well as risk factor modification per guideline directed care.  ASSESSMENT AND PLAN:  1.  Anginal symptoms of significant SOB with exertion.  Lungs clear on exam. Risk for CAD with DM, Tobacco use, HLD, and abnormal CTA with total RCA collateral circulation from LAD with LAD obstruction.   Discussed cardiac cath and possible outcomes.  He is on ASA 81 mg daily, and statin.  No BB with syncope   The patient understands that risks included but are  not limited to stroke (1 in 1000), death (1 in 1000), kidney failure [usually temporary] (1 in 500), bleeding (1 in 200), allergic reaction [possibly serious] (1 in 200).  Plan for cath on Tuesday with Dr. Varanasi.  2.  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  HLD on statin continue and may need higher dose  4.  Multiple sclerosis per Neuro  5.  Hypothyroid - per IM hx of night sweats   6.  OSA on CPAP with nasal pillow  7.  Orthostatic hypotension- with syncope had a year ago and now syncope has returned. Wearing 30 day event monitor.   (neg chest CTA for PE)  8.  Recent RSV infection now resolved  9. Tobacco use, instructed to stop   Review of hospitalizations, ER visit, echo and cardiac CTA   Current medicines are reviewed with the patient today.  The patient Has no concerns regarding medicines.  The following changes have been made:  See above Labs/ tests ordered today include:see above  Disposition:   FU:  see above  Signed, Demani Weyrauch, NP  10/01/2020 3:28 PM    [facility] [address], [place], [place]  [address]/ [address] [place], [place] Phone: [phone]; Fax: [phone]  [phone] 

## 2020-10-02 ENCOUNTER — Other Ambulatory Visit (HOSPITAL_COMMUNITY)
Admission: RE | Admit: 2020-10-02 | Discharge: 2020-10-02 | Disposition: A | Discharge status: Home / Self Care | Payer: Medicare HMO | Source: Ambulatory Visit | Attending: Interventional Cardiology | Admitting: Interventional Cardiology

## 2020-10-02 DIAGNOSIS — Z20822 Contact with and (suspected) exposure to covid-19: Secondary | ICD-10-CM | POA: Diagnosis not present

## 2020-10-02 DIAGNOSIS — Z01812 Encounter for preprocedural laboratory examination: Secondary | ICD-10-CM | POA: Insufficient documentation

## 2020-10-02 LAB — SARS CORONAVIRUS 2 (TAT 6-24 HRS): SARS Coronavirus 2: NEGATIVE

## 2020-10-02 LAB — BASIC METABOLIC PANEL
BUN/Creatinine Ratio: 7 — ABNORMAL LOW (ref 9–20)
BUN: 8 mg/dL (ref 6–24)
CO2: 24 mmol/L (ref 20–29)
Calcium: 8.7 mg/dL (ref 8.7–10.2)
Chloride: 105 mmol/L (ref 96–106)
Creatinine, Ser: 1.08 mg/dL (ref 0.76–1.27)
GFR calc Af Amer: 87 mL/min/{1.73_m2} (ref >59)
GFR calc non Af Amer: 75 mL/min/{1.73_m2} (ref >59)
Glucose: 124 mg/dL — ABNORMAL HIGH (ref 65–99)
Potassium: 4.4 mmol/L (ref 3.5–5.2)
Sodium: 142 mmol/L (ref 134–144)

## 2020-10-02 LAB — CBC
Hematocrit: 46 % (ref 37.5–51.0)
Hemoglobin: 15.4 g/dL (ref 13.0–17.7)
MCH: 30.4 pg (ref 26.6–33.0)
MCHC: 33.5 g/dL (ref 31.5–35.7)
MCV: 91 fL (ref 79–97)
Platelets: 174 10*3/uL (ref 150–450)
RBC: 5.07 x10E6/uL (ref 4.14–5.80)
RDW: 13.6 % (ref 11.6–15.4)
WBC: 6 10*3/uL (ref 3.4–10.8)

## 2020-10-04 ENCOUNTER — Telehealth: Payer: Self-pay | Admitting: *Deleted

## 2020-10-04 NOTE — Telephone Encounter (Signed)
Pt contacted pre-catheterization scheduled at [facility] for: Tuesday October 05, 2020 9 AM Verified arrival time and place: [facility] Main Entrance [facility]) at: 7 AM   No solid food after midnight prior to cath, clear liquids until 5 AM day of procedure.   AM meds can be  taken pre-cath with sips of water including: ASA 81 mg-has been tolerating this dose.   Confirmed patient has responsible adult to drive home post procedure and be with patient first 24 hours after arriving home: yes  You are allowed ONE visitor in the waiting room during the time you are at the hospital for your procedure. Both you and your visitor must wear a mask once you enter the hospital.       COVID-19 Pre-Screening Questions:   In the past 14 days have you had a new cough, new headache, new nasal congestion, fever (100.4 or greater) unexplained body aches, new sore throat, or sudden loss of taste or sense of smell? no  In the past 14 days have you been around anyone with known Covid 19? no  Have you been vaccinated for COVID-19? No  Reviewed procedure/mask/visitor instructions, COVID-19 questions with patient's wife (pt verbal permission).

## 2020-10-05 ENCOUNTER — Other Ambulatory Visit: Payer: Self-pay

## 2020-10-05 ENCOUNTER — Ambulatory Visit (HOSPITAL_COMMUNITY)
Admission: RE | Admit: 2020-10-05 | Discharge: 2020-10-05 | Disposition: A | Discharge status: Home / Self Care | Payer: Medicare HMO | Attending: Interventional Cardiology | Admitting: Interventional Cardiology

## 2020-10-05 ENCOUNTER — Ambulatory Visit (HOSPITAL_COMMUNITY)
Admission: RE | Disposition: A | Discharge status: Home / Self Care | Payer: Self-pay | Source: Home / Self Care | Attending: Interventional Cardiology

## 2020-10-05 DIAGNOSIS — E039 Hypothyroidism, unspecified: Secondary | ICD-10-CM | POA: Insufficient documentation

## 2020-10-05 DIAGNOSIS — F17210 Nicotine dependence, cigarettes, uncomplicated: Secondary | ICD-10-CM | POA: Diagnosis not present

## 2020-10-05 DIAGNOSIS — I25118 Atherosclerotic heart disease of native coronary artery with other forms of angina pectoris: Secondary | ICD-10-CM | POA: Diagnosis not present

## 2020-10-05 DIAGNOSIS — I25119 Atherosclerotic heart disease of native coronary artery with unspecified angina pectoris: Secondary | ICD-10-CM | POA: Diagnosis present

## 2020-10-05 DIAGNOSIS — Z79899 Other long term (current) drug therapy: Secondary | ICD-10-CM | POA: Diagnosis not present

## 2020-10-05 DIAGNOSIS — I951 Orthostatic hypotension: Secondary | ICD-10-CM | POA: Insufficient documentation

## 2020-10-05 DIAGNOSIS — Z79890 Hormone replacement therapy (postmenopausal): Secondary | ICD-10-CM | POA: Diagnosis not present

## 2020-10-05 DIAGNOSIS — I252 Old myocardial infarction: Secondary | ICD-10-CM | POA: Diagnosis not present

## 2020-10-05 DIAGNOSIS — Z955 Presence of coronary angioplasty implant and graft: Secondary | ICD-10-CM

## 2020-10-05 DIAGNOSIS — R55 Syncope and collapse: Secondary | ICD-10-CM | POA: Diagnosis not present

## 2020-10-05 DIAGNOSIS — E785 Hyperlipidemia, unspecified: Secondary | ICD-10-CM | POA: Diagnosis not present

## 2020-10-05 DIAGNOSIS — I10 Essential (primary) hypertension: Secondary | ICD-10-CM | POA: Diagnosis not present

## 2020-10-05 DIAGNOSIS — Z7982 Long term (current) use of aspirin: Secondary | ICD-10-CM | POA: Diagnosis not present

## 2020-10-05 DIAGNOSIS — G35 Multiple sclerosis: Secondary | ICD-10-CM | POA: Diagnosis not present

## 2020-10-05 DIAGNOSIS — Z886 Allergy status to analgesic agent status: Secondary | ICD-10-CM | POA: Diagnosis not present

## 2020-10-05 DIAGNOSIS — I2582 Chronic total occlusion of coronary artery: Secondary | ICD-10-CM | POA: Diagnosis not present

## 2020-10-05 DIAGNOSIS — E1142 Type 2 diabetes mellitus with diabetic polyneuropathy: Secondary | ICD-10-CM | POA: Insufficient documentation

## 2020-10-05 DIAGNOSIS — I429 Cardiomyopathy, unspecified: Secondary | ICD-10-CM | POA: Insufficient documentation

## 2020-10-05 DIAGNOSIS — G4733 Obstructive sleep apnea (adult) (pediatric): Secondary | ICD-10-CM | POA: Diagnosis not present

## 2020-10-05 DIAGNOSIS — I2510 Atherosclerotic heart disease of native coronary artery without angina pectoris: Secondary | ICD-10-CM

## 2020-10-05 HISTORY — PX: CORONARY STENT INTERVENTION: CATH118234

## 2020-10-05 HISTORY — PX: CORONARY PRESSURE/FFR STUDY: CATH118243

## 2020-10-05 HISTORY — PX: LEFT HEART CATH AND CORONARY ANGIOGRAPHY: CATH118249

## 2020-10-05 HISTORY — PX: CORONARY ULTRASOUND/IVUS: CATH118244

## 2020-10-05 LAB — POCT ACTIVATED CLOTTING TIME: Activated Clotting Time: 279 seconds

## 2020-10-05 SURGERY — LEFT HEART CATH AND CORONARY ANGIOGRAPHY
Anesthesia: LOCAL

## 2020-10-05 MED ORDER — ATORVASTATIN CALCIUM 80 MG PO TABS: 80.0000 mg | ORAL_TABLET | Freq: Every day | ORAL | 3 refills | Status: AC

## 2020-10-05 MED ORDER — MIDODRINE HCL 5 MG PO TABS: 5.0000 mg | ORAL_TABLET | Freq: Every day | ORAL | Status: DC

## 2020-10-05 MED ORDER — VERAPAMIL HCL 2.5 MG/ML IV SOLN
INTRAVENOUS | Status: DC | PRN
  Administered 2020-10-05: 10 mL via INTRA_ARTERIAL

## 2020-10-05 MED ORDER — HEPARIN SODIUM (PORCINE) 1000 UNIT/ML IJ SOLN
INTRAMUSCULAR | Status: AC
  Filled 2020-10-05: qty 1

## 2020-10-05 MED ORDER — HEPARIN SODIUM (PORCINE) 1000 UNIT/ML IJ SOLN
INTRAMUSCULAR | Status: DC | PRN
  Administered 2020-10-05: 5000 [IU] via INTRAVENOUS
  Administered 2020-10-05: 6000 [IU] via INTRAVENOUS

## 2020-10-05 MED ORDER — TIROFIBAN HCL IN NACL 5-0.9 MG/100ML-% IV SOLN
INTRAVENOUS | Status: AC
  Filled 2020-10-05: qty 100

## 2020-10-05 MED ORDER — SODIUM CHLORIDE 0.9 % WEIGHT BASED INFUSION
3.0000 mL/kg/h | INTRAVENOUS | Status: AC
  Administered 2020-10-05: 3 mL/kg/h via INTRAVENOUS

## 2020-10-05 MED ORDER — ASPIRIN EC 81 MG PO TBEC: 81.0000 mg | DELAYED_RELEASE_TABLET | Freq: Every day | ORAL | Status: DC

## 2020-10-05 MED ORDER — PANTOPRAZOLE SODIUM 40 MG PO TBEC
40.0000 mg | DELAYED_RELEASE_TABLET | Freq: Every day | ORAL | Status: DC
Start: 2020-10-05 — End: 2020-10-05

## 2020-10-05 MED ORDER — MIDAZOLAM HCL 2 MG/2ML IJ SOLN
INTRAMUSCULAR | Status: AC
  Filled 2020-10-05: qty 2

## 2020-10-05 MED ORDER — CLOPIDOGREL BISULFATE 300 MG PO TABS
ORAL_TABLET | ORAL | Status: AC
  Filled 2020-10-05: qty 2

## 2020-10-05 MED ORDER — MIDAZOLAM HCL 2 MG/2ML IJ SOLN
INTRAMUSCULAR | Status: DC | PRN
  Administered 2020-10-05: 2 mg via INTRAVENOUS

## 2020-10-05 MED ORDER — CLOPIDOGREL BISULFATE 75 MG PO TABS: 75.0000 mg | ORAL_TABLET | Freq: Every day | ORAL | Status: DC

## 2020-10-05 MED ORDER — SODIUM CHLORIDE 0.9% FLUSH: 3.0000 mL | Freq: Two times a day (BID) | INTRAVENOUS | Status: DC

## 2020-10-05 MED ORDER — ATORVASTATIN CALCIUM 10 MG PO TABS: 10.0000 mg | ORAL_TABLET | Freq: Every day | ORAL | Status: DC

## 2020-10-05 MED ORDER — HEPARIN (PORCINE) IN NACL 1000-0.9 UT/500ML-% IV SOLN
INTRAVENOUS | Status: DC | PRN
  Administered 2020-10-05: 500 mL

## 2020-10-05 MED ORDER — IOHEXOL 350 MG/ML SOLN
INTRAVENOUS | Status: DC | PRN
  Administered 2020-10-05: 140 mL

## 2020-10-05 MED ORDER — ACETAMINOPHEN 325 MG PO TABS: 650.0000 mg | ORAL_TABLET | ORAL | Status: DC | PRN

## 2020-10-05 MED ORDER — ASPIRIN 81 MG PO CHEW: 81.0000 mg | CHEWABLE_TABLET | ORAL | Status: DC

## 2020-10-05 MED ORDER — SODIUM CHLORIDE 0.9% FLUSH: 3.0000 mL | INTRAVENOUS | Status: DC | PRN

## 2020-10-05 MED ORDER — CLOPIDOGREL BISULFATE 300 MG PO TABS
ORAL_TABLET | ORAL | Status: DC | PRN
  Administered 2020-10-05: 600 mg via ORAL

## 2020-10-05 MED ORDER — TIROFIBAN (AGGRASTAT) BOLUS VIA INFUSION
INTRAVENOUS | Status: DC | PRN
  Administered 2020-10-05: 2915 ug via INTRAVENOUS

## 2020-10-05 MED ORDER — DULOXETINE HCL 60 MG PO CPEP: 60.0000 mg | ORAL_CAPSULE | Freq: Every day | ORAL | Status: DC

## 2020-10-05 MED ORDER — BUPROPION HCL ER (XL) 150 MG PO TB24: 150.0000 mg | ORAL_TABLET | Freq: Every day | ORAL | Status: DC

## 2020-10-05 MED ORDER — NITROGLYCERIN 1 MG/10 ML FOR IR/CATH LAB
INTRA_ARTERIAL | Status: AC
  Filled 2020-10-05: qty 10

## 2020-10-05 MED ORDER — PANTOPRAZOLE SODIUM 40 MG PO TBEC: 40.0000 mg | DELAYED_RELEASE_TABLET | Freq: Every day | ORAL | 3 refills | Status: AC

## 2020-10-05 MED ORDER — CLOPIDOGREL BISULFATE 75 MG PO TABS: 75.0000 mg | ORAL_TABLET | Freq: Every day | ORAL | 3 refills | Status: AC

## 2020-10-05 MED ORDER — LIDOCAINE HCL (PF) 1 % IJ SOLN
INTRAMUSCULAR | Status: DC | PRN
  Administered 2020-10-05: 2 mL

## 2020-10-05 MED ORDER — ALBUTEROL SULFATE HFA 108 (90 BASE) MCG/ACT IN AERS: 2.0000 | INHALATION_SPRAY | Freq: Four times a day (QID) | RESPIRATORY_TRACT | Status: DC | PRN

## 2020-10-05 MED ORDER — LOSARTAN POTASSIUM 25 MG PO TABS: 12.5000 mg | ORAL_TABLET | Freq: Every day | ORAL | Status: DC

## 2020-10-05 MED ORDER — ONDANSETRON HCL 4 MG/2ML IJ SOLN: 4.0000 mg | Freq: Four times a day (QID) | INTRAMUSCULAR | Status: DC | PRN

## 2020-10-05 MED ORDER — LABETALOL HCL 5 MG/ML IV SOLN: 10.0000 mg | INTRAVENOUS | Status: DC | PRN

## 2020-10-05 MED ORDER — NITROGLYCERIN 1 MG/10 ML FOR IR/CATH LAB
INTRA_ARTERIAL | Status: DC | PRN
  Administered 2020-10-05: 200 ug via INTRACORONARY

## 2020-10-05 MED ORDER — ADULT MULTIVITAMIN W/MINERALS CH: 1.0000 | ORAL_TABLET | Freq: Every day | ORAL | Status: DC

## 2020-10-05 MED ORDER — DIROXIMEL FUMARATE 231 MG PO CPDR: 462.0000 mg | DELAYED_RELEASE_CAPSULE | Freq: Two times a day (BID) | ORAL | Status: DC

## 2020-10-05 MED ORDER — VERAPAMIL HCL 2.5 MG/ML IV SOLN
INTRAVENOUS | Status: AC
  Filled 2020-10-05: qty 2

## 2020-10-05 MED ORDER — LEVOTHYROXINE SODIUM 50 MCG PO TABS
50.0000 ug | ORAL_TABLET | Freq: Every day | ORAL | Status: DC
Start: 2020-10-06 — End: 2020-10-05

## 2020-10-05 MED ORDER — FENTANYL CITRATE (PF) 100 MCG/2ML IJ SOLN
INTRAMUSCULAR | Status: AC
  Filled 2020-10-05: qty 2

## 2020-10-05 MED ORDER — SODIUM CHLORIDE 0.9 % WEIGHT BASED INFUSION
1.0000 mL/kg/h | INTRAVENOUS | Status: DC
  Administered 2020-10-05: 250 mL via INTRAVENOUS

## 2020-10-05 MED ORDER — NITROGLYCERIN 0.4 MG SL SUBL: 0.4000 mg | SUBLINGUAL_TABLET | SUBLINGUAL | Status: DC | PRN

## 2020-10-05 MED ORDER — PREGABALIN 75 MG PO CAPS: 150.0000 mg | ORAL_CAPSULE | Freq: Three times a day (TID) | ORAL | Status: DC

## 2020-10-05 MED ORDER — HYDRALAZINE HCL 20 MG/ML IJ SOLN: 10.0000 mg | INTRAMUSCULAR | Status: DC | PRN

## 2020-10-05 MED ORDER — LIDOCAINE HCL (PF) 1 % IJ SOLN
INTRAMUSCULAR | Status: AC
  Filled 2020-10-05: qty 30

## 2020-10-05 MED ORDER — HEPARIN (PORCINE) IN NACL 1000-0.9 UT/500ML-% IV SOLN
INTRAVENOUS | Status: AC
  Filled 2020-10-05: qty 1000

## 2020-10-05 MED ORDER — ZOLPIDEM TARTRATE 10 MG PO TABS: 10.0000 mg | ORAL_TABLET | Freq: Every day | ORAL | Status: DC

## 2020-10-05 MED ORDER — ASPIRIN 81 MG PO CHEW: 81.0000 mg | CHEWABLE_TABLET | Freq: Every day | ORAL | Status: DC

## 2020-10-05 MED ORDER — METHOCARBAMOL 750 MG PO TABS: 750.0000 mg | ORAL_TABLET | Freq: Three times a day (TID) | ORAL | Status: DC

## 2020-10-05 MED ORDER — FENTANYL CITRATE (PF) 100 MCG/2ML IJ SOLN
INTRAMUSCULAR | Status: DC | PRN
Start: 2020-10-05 — End: 2020-10-05
  Administered 2020-10-05: 25 ug via INTRAVENOUS

## 2020-10-05 MED ORDER — SODIUM CHLORIDE 0.9 % IV SOLN: 250.0000 mL | INTRAVENOUS | Status: DC | PRN

## 2020-10-05 MED ORDER — SODIUM CHLORIDE 0.9 % IV SOLN: INTRAVENOUS | Status: DC

## 2020-10-05 MED FILL — ATORVASTATIN CALCIUM 80 MG: 80 | 90 days supply | Qty: 90 | Fill #0

## 2020-10-05 MED FILL — CLOPIDOGREL 75 MG TABLET: 75 | 90 days supply | Qty: 90 | Fill #0

## 2020-10-05 MED FILL — PANTOPRAZOLE SOD DR 40 MG T: 40 | 90 days supply | Qty: 90 | Fill #0

## 2020-10-05 SURGICAL SUPPLY — 21 items
BALLN SAPPHIRE 2.5X20 (BALLOONS) ×2
BALLN SAPPHIRE ~~LOC~~ 3.75X18 (BALLOONS) ×1 IMPLANT
BALLOON SAPPHIRE 2.5X20 (BALLOONS) IMPLANT
CATH 5FR JL3.5 JR4 ANG PIG MP (CATHETERS) ×1 IMPLANT
CATH LAUNCHER 6FR EBU3.5 (CATHETERS) ×1 IMPLANT
CATH OPTICROSS HD (CATHETERS) ×1 IMPLANT
DEVICE RAD COMP TR BAND LRG (VASCULAR PRODUCTS) ×1 IMPLANT
GLIDESHEATH SLEND SS 6F .021 (SHEATH) ×1 IMPLANT
GUIDEWIRE INQWIRE 1.5J.035X260 (WIRE) IMPLANT
GUIDEWIRE PRESSURE COMET II (WIRE) ×1 IMPLANT
INQWIRE 1.5J .035X260CM (WIRE) ×2
KIT ENCORE 26 ADVANTAGE (KITS) ×1 IMPLANT
KIT HEART LEFT (KITS) ×2 IMPLANT
KIT HEMO VALVE WATCHDOG (MISCELLANEOUS) ×1 IMPLANT
PACK CARDIAC CATHETERIZATION (CUSTOM PROCEDURE TRAY) ×2 IMPLANT
SHEATH PROBE COVER 6X72 (BAG) ×1 IMPLANT
SLED PULL BACK IVUS (MISCELLANEOUS) ×1 IMPLANT
STENT SYNERGY XD 3.0X28 (Permanent Stent) IMPLANT
SYNERGY XD 3.0X28 (Permanent Stent) ×2 IMPLANT
TRANSDUCER W/STOPCOCK (MISCELLANEOUS) ×2 IMPLANT
TUBING CIL FLEX 10 FLL-RA (TUBING) ×2 IMPLANT

## 2020-10-05 NOTE — Discharge Instructions (Signed)
DRINK PLENTY OF FLUIDS FOR THE NEXT 2-3 DAYS.  KEEP ARM ELEVATED THE REMAINDER OF THE DAY.  Radial Site Care  This sheet gives you information about how to care for yourself after your procedure. Your health care provider may also give you more specific instructions. If you have problems or questions, contact your health care provider. What can I expect after the procedure? After the procedure, it is common to have:  Bruising and tenderness at the catheter insertion area. Follow these instructions at home: Medicines  Take over-the-counter and prescription medicines only as told by your health care provider. Insertion site care 1. Follow instructions from your health care provider about how to take care of your insertion site. Make sure you: ? Wash your hands with soap and water before you change your bandage (dressing). If soap and water are not available, use hand sanitizer. ? Change your dressing as told by your health care provider. 2. Check your insertion site every day for signs of infection. Check for: ? Redness, swelling, or pain. ? Fluid or blood. ? Pus or a bad smell. ? Warmth. 3. Do not take baths, swim, or use a hot tub for 5 days. 4. You may shower 24-48 hours after the procedure. ? Remove the dressing and gently wash the site with plain soap and water. ? Pat the area dry with a clean towel. ? Do not rub the site. That could cause bleeding. 5. Do not apply powder or lotion to the site. Activity  1. For 24 hours after the procedure, or as directed by your health care provider: ? Do not flex or bend the affected arm. ? Do not push or pull heavy objects with the affected arm. ? Do not drive yourself home from the hospital or clinic. You may drive 24 hours after the procedure. ? Do not operate machinery or power tools. 2. Do not push, pull or lift anything that is heavier than 10 lb for 5 days. 3. Ask your health care provider when it is okay to: ? Return to work or  school. ? Resume usual physical activities or sports. ? Resume sexual activity. General instructions  If the catheter site starts to bleed, raise your arm and put firm pressure on the site. If the bleeding does not stop, get help right away. This is a medical emergency.  If you went home on the same day as your procedure, a responsible adult should be with you for the first 24 hours after you arrive home.  Keep all follow-up visits as told by your health care provider. This is important. Contact a health care provider if:  You have a fever.  You have redness, swelling, or yellow drainage around your insertion site. Get help right away if:  You have unusual pain at the radial site.  The catheter insertion area swells very fast.  The insertion area is bleeding, and the bleeding does not stop when you hold steady pressure on the area.  Your arm or hand becomes pale, cool, tingly, or numb. These symptoms may represent a serious problem that is an emergency. Do not wait to see if the symptoms will go away. Get medical help right away. Call your local emergency services (911 in the [place] Do not drive yourself to the hospital. Summary  After the procedure, it is common to have bruising and tenderness at the site.  Follow instructions from your health care provider about how to take care of your radial site wound. Check   the wound every day for signs of infection.  Do not push, pull or lift anything that is heavier than 10 lb for 5 days.  This information is not intended to replace advice given to you by your health care provider. Make sure you discuss any questions you have with your health care provider. Document Revised: 01/09/2018 Document Reviewed: 01/09/2018 Elsevier Patient Education  2020 [employer] 

## 2020-10-05 NOTE — Progress Notes (Signed)
CARDIAC REHAB PHASE I   Stent ed completed with pt. Pt educated on importance of ASA and Plavix. Pt given stent card along with heart healthy and diabetic diets. Reviewed site care, restrictions, and exercise guidelines. Will refer to CRP II [place].   [id number] Reynold Bowen, RN BSN 10/05/2020 2:54 PM

## 2020-10-05 NOTE — Interval H&P Note (Signed)
Cath Lab Visit (complete for each Cath Lab visit)  Clinical Evaluation Leading to the Procedure:   ACS: No.  Non-ACS:    Anginal Classification: CCS III  Anti-ischemic medical therapy: Minimal Therapy (1 class of medications)  Non-Invasive Test Results: High-risk stress test findings: cardiac mortality >3%/year  Prior CABG: No previous CABG  Abnormal CTA coronaries    History and Physical Interval Note:  10/05/2020 8:57 AM  Larry [name]  has presented today for surgery, with the diagnosis of abnormal cardiac ct.  The various methods of treatment have been discussed with the patient and family. After consideration of risks, benefits and other options for treatment, the patient has consented to  Procedure(s): LEFT HEART CATH AND CORONARY ANGIOGRAPHY (N/A) as a surgical intervention.  The patient's history has been reviewed, patient examined, no change in status, stable for surgery.  I have reviewed the patient's chart and labs.  Questions were answered to the patient's satisfaction.     Lance Muss

## 2020-10-05 NOTE — Discharge Summary (Addendum)
Discharge Summary for Same Day PCI   Patient ID: Larry [name] MRN: [id number]; DOB: [birth year]  Admit date: 10/05/2020 Discharge date: 10/05/2020  Primary Care Provider: Roberts Gaudy., MD  Primary Cardiologist: Armanda Magic, MD    Discharge Diagnoses    Active Problems:   Coronary artery disease   OSA on CPAP   HTN with orthostatic hypotension    DM   Tobacco abuse   Diagnostic Studies/Procedures    Cardiac Catheterization 10/05/2020:  CORONARY STENT INTERVENTION  INTRAVASCULAR PRESSURE WIRE/FFR STUDY  Intravascular Ultrasound/IVUS  LEFT HEART CATH AND CORONARY ANGIOGRAPHY  Conclusion    Mid RCA lesion is 100% stenosed. Chronic total occlusion. Right to right and left to right collaterals.  Mid LAD lesion is 70% stenosed. DFR 0.85.  A drug-eluting stent was successfully placed using a SYNERGY XD 3.0X28.  Post intervention, there is a 0% residual stenosis.  2nd Diag lesion is 70% stenosed. This is a small branch vessel, too small for stent.  There is moderate left ventricular systolic dysfunction.  The left ventricular ejection fraction is 35-45% by visual estimate.  LV end diastolic pressure is normal.  There is no aortic valve stenosis.   Continue aggressive secondary prevention.  Consider clopidogrel monotherapy after 6 months given other CAD.  Medical therapy for LV dysfunction.  EF should improve with this revascularization since flow to anterior wall and collaterals to RCA has improved.   Diagnostic Dominance: Right  Intervention      History of Present Illness     Larry [name] is a 59 y.o. male with hx of MS, OSA on CPAP, peripheral neuropathy HTN with orthostatic hypotension, HLD, DM-2 tobacco use, and recent RSV presented for outpatient cath.   Admitted 08/2020 for recurrent syncope without prodromal symptoms. Echo was ordered and showed LVEF of 40-45% with global hypokinesis slightly worse in the septum as well as mild grade 1  diastolic dysfunction. RV was normal but also had mildly reduced systolic function. Noted DIE and exertional fatigue.   Outpatient cardiac CTA is positive. Total RCA,  Reconstitutes distally from collateral flow.  LAD with 50-69% stenosis, CT FFR across lesion in .61 plaque has high risk features.   LCX non dominant mid LCX with 50-69% stenosis, CT FFR 0.76.  Cardiac catheterization was arranged for further evaluation.   Hospital Course   The patient underwent cardiac cath as noted above showing chronic total occlusion or mRCA with right to right and left to right collaterals. Mid LAD lesion is 70% stenosed (DFR 0.85) s/p DES. 70% 2nd dig, vessel small for stent.  Plan for DAPT with ASA/Plavix for at least 6 months and consider Plavix as monotherapy after 6 months. The patient was seen by cardiac rehab while in short stay. There were no observed complications post cath. Radial cath site was re-evaluated prior to discharge and found to be stable without any complications. Instructions/precautions regarding cath site care were given prior to discharge.  Larry [name] was seen by Dr. Eldridge Dace and determined stable for discharge home. Follow up with our office has been arranged. Medications are listed below. Pertinent changes include addition of Plavix.   Some studies suggest Prilosec/Omeprazole interacts with Plavix. We changed your Prilosec/Omeprazole to Protonix for less chance of interaction.    Cath/PCI Registry Performance & Quality Measures: 1. Aspirin prescribed? - Yes 2. ADP Receptor Inhibitor (Plavix/Clopidogrel, Brilinta/Ticagrelor or Effient/Prasugrel) prescribed (includes medically managed patients)? - Yes 3. High Intensity Statin (Lipitor 40-80mg  or Crestor 20-40mg ) prescribed? - Yes  4. For EF <40%, was ACEI/ARB prescribed? - Yes 5. For EF <40%, Aldosterone Antagonist (Spironolactone or Eplerenone) prescribed? - Not Applicable (EF >/= 40%) 6. Cardiac Rehab Phase II ordered (Included  Medically managed Patients)? - Yes  _____________   Discharge Vitals Blood pressure (!) 147/85, pulse 63, temperature 98.6 F (37 C), temperature source Oral, resp. rate 20, height 5\' 10"  (1.778 m), weight 116.6 kg, SpO2 97 %.  Filed Weights   10/05/20 0651  Weight: 116.6 kg    Last Labs & Radiologic Studies    High Sensitivity Troponin:   Recent Labs  Lab 09/12/20 0546 09/16/20 1743 09/16/20 1943  TROPONINIHS 4 5 4     _____________  DG Chest 2 View  Result Date: 09/16/2020 CLINICAL DATA:  Syncope EXAM: CHEST - 2 VIEW COMPARISON:  09/12/2020 FINDINGS: Surgical hardware in the cervical spine. No focal opacity or pleural effusion. Stable enlarged cardiomediastinal silhouette. No pneumothorax. IMPRESSION: No active cardiopulmonary disease. Cardiomegaly. Electronically Signed   By: 09/18/2020 M.D.   On: 09/16/2020 18:20   CT ANGIO CHEST PE W OR WO CONTRAST  Result Date: 09/13/2020 CLINICAL DATA:  Syncope, elevated D-dimer EXAM: CT ANGIOGRAPHY CHEST WITH CONTRAST TECHNIQUE: Multidetector CT imaging of the chest was performed using the standard protocol during bolus administration of intravenous contrast. Multiplanar CT image reconstructions and MIPs were obtained to evaluate the vascular anatomy. CONTRAST:  09/18/2020 OMNIPAQUE IOHEXOL 350 MG/ML SOLN COMPARISON:  Chest x-ray 09/12/2020 FINDINGS: Cardiovascular: No filling defects in the pulmonary arteries to suggest pulmonary emboli. Heart is normal size. Scattered coronary artery calcifications. Aorta normal caliber. Mediastinum/Nodes: No mediastinal, hilar, or axillary adenopathy. Trachea and esophagus are unremarkable. Thyroid unremarkable. Lungs/Pleura: Lungs are clear. No focal airspace opacities or suspicious nodules. No effusions. Upper Abdomen: Diffuse fatty infiltration of the liver. Musculoskeletal: Chest wall soft tissues are unremarkable. No acute bony abnormality. Review of the MIP images confirms the above findings. IMPRESSION:  No evidence of pulmonary embolus. Coronary artery disease. Diffuse fatty infiltration of the liver. No acute cardiopulmonary disease. Electronically Signed   By: M.D.   On: 09/13/2020 16:15   CARDIAC CATHETERIZATION  Result Date: 10/05/2020  Mid RCA lesion is 100% stenosed. Chronic total occlusion. Right to right and left to right collaterals.  Mid LAD lesion is 70% stenosed. DFR 0.85.  A drug-eluting stent was successfully placed using a SYNERGY XD 3.0X28.  Post intervention, there is a 0% residual stenosis.  2nd Diag lesion is 70% stenosed. This is a small branch vessel, too small for stent.  There is moderate left ventricular systolic dysfunction.  The left ventricular ejection fraction is 35-45% by visual estimate.  LV end diastolic pressure is normal.  There is no aortic valve stenosis.  Continue aggressive secondary prevention.  Consider clopidogrel monotherapy after 6 months given other CAD. Medical therapy for LV dysfunction.  EF should improve with this revascularization since flow to anterior wall and collaterals to RCA has improved.   CT CORONARY MORPH W/CTA COR W/SCORE W/CA W/CM &/OR WO/CM  Addendum Date: 09/28/2020   ADDENDUM REPORT: 09/28/2020 00:23 CLINICAL DATA:  5M with new systolic dysfunction EXAM: Cardiac/Coronary CTA TECHNIQUE: The patient was scanned on a 11/28/2020. FINDINGS: A 100 kV prospective scan was triggered in the descending thoracic aorta at 111 HU's. Axial non-contrast 3 mm slices were carried out through the heart. The data set was analyzed on a dedicated work station and scored using the Agatson method. Gantry rotation speed was 250 msecs and collimation  was .6 mm. No beta blockade and 0.8 mg of sl NTG was given. The 3D data set was reconstructed in 5% intervals of the 67-82 % of the R-R cycle. Diastolic phases were analyzed on a dedicated work station using MPR, MIP and VRT modes. The patient received 80 cc of contrast. Coronary  Arteries:  Normal coronary origin.  Right dominance. RCA is a large dominant artery. Total occlusion in proximal RCA. Reconstitutes distally from collateral flow Left main is a large artery that gives rise to LAD and LCX arteries. LAD is a large vessel. There is mixed plaque in the mid LAD causing 50-69% stenosis. CT FFR across lesion is 0.61. Plaque has high risk features, including low attenuation, positive remodeling, and spotty calcifications. LCX is a non-dominant artery. Calcified plaque in the proximal LCX causes 0-24% stenosis. Noncalcified plaque in mid LCX causes 50 to 69% stenosis. CT FFR across lesion is 0.76. Vessel is small, ~12mm Other findings: Left Ventricle: Mild dilatation Left Atrium: Normal size Pulmonary Veins: Normal configuration Right Ventricle: Mild dilatation Right Atrium: Normal size Cardiac valves: No calcifications Thoracic aorta: Normal size Pulmonary Arteries: Normal size Systemic Veins: Normal drainage Pericardium: Normal thickness IMPRESSION: 1. Coronary calcium score of 109. This was 74th percentile for age and sex matched control. 2.  Multivessel obstructive CAD 3. Mixed plaque in mid LAD causes moderate (50-69%) stenosis. CT FFR across lesion is 0.61, suggesting lesion is functionally significant. Plaque has high risk features, including low attenuation, positive remodeling, and spotty calcifications. 4. Total occlusion in proximal RCA. Reconstitutes distally from collateral flow 5. Noncalcified plaque in mid LCX causes moderate (50-69%) stenosis. CT FFR across lesion is 0.76, suggesting lesion is functionally significant. However LCX is small vessel, with diameter ~79mm. CAD-RADS 4 Severe stenosis. (70-99%). Cardiac catheterization is recommended. Consider symptom-guided anti-ischemic pharmacotherapy as well as risk factor modification per guideline directed care. Electronically Signed   By: Epifanio Lesches MD   On: 09/28/2020 00:23   Result Date: 09/28/2020 EXAM:  OVER-READ INTERPRETATION  CT CHEST The following report is an over-read performed by radiologist Dr. Trudie Reed of [facility] on 09/27/2020. This over-read does not include interpretation of cardiac or coronary anatomy or pathology. The coronary calcium score/coronary CTA interpretation by the cardiologist is attached. COMPARISON:  None. FINDINGS: Within the visualized portions of the thorax there are no suspicious appearing pulmonary nodules or masses, there is no acute consolidative airspace disease, no pleural effusions, no pneumothorax and no lymphadenopathy. Visualized portions of the upper abdomen demonstrates diffuse low attenuation throughout the visualized hepatic parenchyma, indicative of severe hepatic steatosis. There are no aggressive appearing lytic or blastic lesions noted in the visualized portions of the skeleton. IMPRESSION: 1. Hepatic steatosis. Electronically Signed: By: Trudie Reed M.D. On: 09/27/2020 15:06   DG Chest Portable 1 View  Result Date: 09/12/2020 CLINICAL DATA:  Shortness of breath. No known COVID exposure. Current smoker. EXAM: PORTABLE CHEST 1 VIEW COMPARISON:  None. FINDINGS: Shallow inspiration. Cardiac enlargement. No vascular congestion or edema. No focal consolidation or airspace disease. No pleural effusions. No pneumothorax. Mediastinal contours appear intact. Postoperative changes in the cervical spine. IMPRESSION: Cardiac enlargement. No evidence of active pulmonary disease. Electronically Signed   By: Burman Nieves M.D.   On: 09/12/2020 00:18   ECHOCARDIOGRAM COMPLETE  Result Date: 09/12/2020    ECHOCARDIOGRAM REPORT   Patient Name:   Larry [name] [facility] Date of Exam: 09/12/2020 Medical Rec #:  [id number]      Height:  69.0 in Accession #:    [id number]     Weight:       215.6 lb Date of Birth:  [birth year]      BSA:          2.133 m Patient Age:    58 years       BP:           124/78 mmHg Patient Gender: M              HR:           79  bpm. Exam Location:  Inpatient Procedure: 2D Echo, Cardiac Doppler, Color Doppler and Intracardiac            Opacification Agent Indications:    I50.9* Heart failure (unspecified)  History:        Patient has no prior history of Echocardiogram examinations.                 Signs/Symptoms:Dyspnea and Shortness of Breath; Risk                 Factors:Sleep Apnea and Diabetes. Multiple sclerosis. Hypoxia.                 RSV.  Sonographer:    Sheralyn Boatman RDCS Referring Phys: [id number] Deno Lunger [facility]  Sonographer Comments: Technically difficult study due to poor echo windows, Technically challenging study due to limited acoustic windows, suboptimal parasternal window, suboptimal apical window and suboptimal subcostal window. Image acquisition challenging due to patient body habitus and Image acquisition challenging due to respiratory motion. Study is nearly non- diagnostic due to respiratory motion and poor windows. Patient could not turn or bend left arm. Extremely difficult study. IMPRESSIONS  1. Poor image quality even with administration of Definity contrast. There appears to be global hypokinesis slightly worse in the septum.. Left ventricular ejection fraction, by estimation, is 40 to 45%. The left ventricle has mildly decreased function.  The left ventricle demonstrates regional wall motion abnormalities (see scoring diagram/findings for description). There is mild concentric left ventricular hypertrophy. Left ventricular diastolic parameters are consistent with Grade I diastolic dysfunction (impaired relaxation).  2. Right ventricular systolic function is mildly reduced. The right ventricular size is normal.  3. The mitral valve is normal in structure. No evidence of mitral valve regurgitation. No evidence of mitral stenosis.  4. The aortic valve is normal in structure. Aortic valve regurgitation is not visualized. No aortic stenosis is present.  5. The inferior vena cava is normal in size with greater than 50%  respiratory variability, suggesting right atrial pressure of 3 mmHg. FINDINGS  Left Ventricle: Poor image quality even with administration of Definity contrast. There appears to be global hypokinesis slightly worse in the septum. Left ventricular ejection fraction, by estimation, is 40 to 45%. The left ventricle has mildly decreased function. The left ventricle demonstrates regional wall motion abnormalities. Definity contrast agent was given IV to delineate the left ventricular endocardial borders. The left ventricular internal cavity size was normal in size. There is mild concentric left ventricular hypertrophy. Left ventricular diastolic parameters are consistent with Grade I diastolic dysfunction (impaired relaxation). Normal left ventricular filling pressure. Right Ventricle: The right ventricular size is normal. No increase in right ventricular wall thickness. Right ventricular systolic function is mildly reduced. Left Atrium: Left atrial size was normal in size. Right Atrium: Right atrial size was normal in size. Pericardium: There is no evidence of pericardial effusion. Mitral Valve: The mitral valve is normal in  structure. No evidence of mitral valve regurgitation. No evidence of mitral valve stenosis. Tricuspid Valve: The tricuspid valve is normal in structure. Tricuspid valve regurgitation is trivial. No evidence of tricuspid stenosis. Aortic Valve: The aortic valve is normal in structure. Aortic valve regurgitation is not visualized. No aortic stenosis is present. Pulmonic Valve: The pulmonic valve was normal in structure. Pulmonic valve regurgitation is not visualized. No evidence of pulmonic stenosis. Aorta: The aortic root is normal in size and structure. Venous: The inferior vena cava is normal in size with greater than 50% respiratory variability, suggesting right atrial pressure of 3 mmHg. IAS/Shunts: No atrial level shunt detected by color flow Doppler.  LEFT VENTRICLE PLAX 2D LVIDd:         5.57  cm  Diastology LVIDs:         4.90 cm  LV e' medial:    5.11 cm/s LV PW:         1.15 cm  LV E/e' medial:  8.2 LV IVS:        1.15 cm  LV e' lateral:   8.38 cm/s LVOT diam:     2.25 cm  LV E/e' lateral: 5.0 LV SV:         63 LV SV Index:   29 LVOT Area:     3.98 cm  RIGHT VENTRICLE RV S prime:     11.00 cm/s TAPSE (M-mode): 1.4 cm LEFT ATRIUM             Index       RIGHT ATRIUM           Index LA diam:        3.90 cm 1.83 cm/m  RA Area:     10.40 cm LA Vol (A2C):   29.7 ml 13.92 ml/m RA Volume:   17.90 ml  8.39 ml/m LA Vol (A4C):   13.1 ml 6.14 ml/m LA Biplane Vol: 18.5 ml 8.67 ml/m  AORTIC VALVE LVOT Vmax:   88.80 cm/s LVOT Vmean:  64.700 cm/s LVOT VTI:    0.158 m  AORTA Ao Root diam: 3.90 cm MITRAL VALVE               TRICUSPID VALVE MV Area (PHT): 3.21 cm    TR Peak grad:   7.7 mmHg MV Decel Time: 236 msec    TR Vmax:        139.00 cm/s MV E velocity: 42.00 cm/s MV A velocity: 68.10 cm/s  SHUNTS MV E/A ratio:  0.62        Systemic VTI:  0.16 m                            Systemic Diam: 2.25 cm Chilton Si MD Electronically signed by Chilton Si MD Signature Date/Time: 09/12/2020/11:50:50 AM    Final     Disposition   Pt is being discharged home today in good condition.  Follow-up Plans & Appointments     Follow-up Information    Filbert Schilder, NP. Go on 10/19/2020.   Specialty: Cardiology Why: @10 :45am for cath follow up. Please arrive 15 minutes early  Contact information: [address] [place] [phone]              Discharge Instructions    AMB Referral to Cardiac Rehabilitation - Phase II   Complete by: As directed    Diagnosis: Coronary Stents   After initial evaluation and  assessments completed: Virtual Based Care may be provided alone or in conjunction with Phase 2 Cardiac Rehab based on patient barriers.: Yes       Discharge Medications   Allergies as of 10/05/2020      Reactions   Aspirin Other (See Comments)   Nose  Bleeding (with high doses)      Medication List    STOP taking these medications   omeprazole 40 MG capsule Commonly known as: PRILOSEC     TAKE these medications   albuterol 108 (90 Base) MCG/ACT inhaler Commonly known as: VENTOLIN HFA Inhale 2 puffs into the lungs every 6 (six) hours as needed for wheezing or shortness of breath.   aspirin EC 81 MG tablet Take 81 mg by mouth daily. Swallow whole.   atorvastatin 80 MG tablet Commonly known as: Lipitor Take 1 tablet (80 mg total) by mouth daily. What changed:   medication strength  how much to take  when to take this   buPROPion 150 MG 24 hr tablet Commonly known as: WELLBUTRIN XL Take 150 mg by mouth daily.   clopidogrel 75 MG tablet Commonly known as: Plavix Take 1 tablet (75 mg total) by mouth daily.   DULoxetine 60 MG capsule Commonly known as: CYMBALTA Take 60 mg by mouth daily.   levothyroxine 50 MCG tablet Commonly known as: SYNTHROID Take 50 mcg by mouth daily before breakfast.   losartan 25 MG tablet Commonly known as: COZAAR Take 0.5 tablets (12.5 mg total) by mouth daily.   methocarbamol 750 MG tablet Commonly known as: ROBAXIN Take 750 mg by mouth 3 (three) times daily.   midodrine 5 MG tablet Commonly known as: PROAMATINE Take 5 mg by mouth daily.   multivitamin with minerals Tabs tablet Take 1 tablet by mouth daily.   nitroGLYCERIN 0.4 MG SL tablet Commonly known as: NITROSTAT Place 1 tablet (0.4 mg total) under the tongue every 5 (five) minutes as needed for chest pain.   pantoprazole 40 MG tablet Commonly known as: Protonix Take 1 tablet (40 mg total) by mouth daily.   pregabalin 150 MG capsule Commonly known as: LYRICA Take 150 mg by mouth in the morning, at noon, and at bedtime.   Vumerity 231 MG Cpdr Generic drug: Diroximel Fumarate Take 462 mg by mouth in the morning and at bedtime.   zolpidem 10 MG tablet Commonly known as: AMBIEN Take 10 mg by mouth at bedtime.            Allergies Allergies  Allergen Reactions  . Aspirin Other (See Comments)    Nose Bleeding (with high doses)    Outstanding Labs/Studies   None  Duration of Discharge Encounter   Greater than 30 minutes including physician time.  SignedManson Passey, PA 10/05/2020, 1:51 PM    I have examined the patient and reviewed assessment and plan and discussed with patient.  Agree with above as stated.  DAPT for 6 months followed by clopidogrel monotherapy.  CTO of RCA present.  Medical therapy for now.  I suspect some of his DOE is from deconditioning related to MS as well. Hopefully improving LAD and collaterals to RCA with this stent will help LVEF.     Lance Muss

## 2020-10-06 ENCOUNTER — Encounter (HOSPITAL_COMMUNITY): Payer: Self-pay | Admitting: Interventional Cardiology

## 2020-10-06 ENCOUNTER — Telehealth: Payer: Self-pay | Admitting: Cardiology

## 2020-10-06 NOTE — Telephone Encounter (Signed)
Looks like [place], [facility] ordered the monitor.

## 2020-10-06 NOTE — Telephone Encounter (Signed)
Melanie with [facility] is calling to make Dr. Mayford Knife aware that additional information is needed for monitor. Please return call to discuss at [phone] IDUP#:[id number] (expires in 24 hours).

## 2020-10-06 NOTE — Telephone Encounter (Signed)
Returned call to [facility] with [facility]. She needed dx and date of admission, etc...all questions answered. States this monitor is approved letter sent.

## 2020-10-07 MED FILL — Tirofiban HCl in NaCl 0.9% IV Soln 5 MG/100ML (Base Equiv): INTRAVENOUS | Qty: 100 | Status: AC

## 2020-10-17 NOTE — Progress Notes (Signed)
Cardiology Office Note   Date:  10/19/2020   ID:  Larry [name], DOB 1961-05-14, MRN [id number]  PCP:  Larry [name]., MD  Cardiologist: Dr. Armanda Magic, MD  Chief Complaint  Larry [name] presents with  . Follow-up    History of Present Illness: Larry [name] is a 59 y.o. male who presents for post same day PCI, seen for Dr. Mayford [name].  Larry [name] is a 59 y.o. male with hx of MS, OSA on CPAP, peripheral neuropathy, HTN with orthostatic hypotension, HLD, DM2,  tobacco use, and recent RSV  who recently underwent outpatient cardiac catheterization secondary to positive cardiac CTA showing total RCA disease, LAD with 50 to 69% stenosis, CT FFR across the lesion showing 0.61 plaque with high risk features.  LCx noted with a 50 to 69% stenosis and CT FFR at 0.76.  Given this, OP LHC was arranged.    Larry [name] underwent cardiac catheterization 10/05/2020 which showed a chronic total occlusion or mRCA with right to right and left to right collaterals. Mid LAD lesion is 70% stenosed (DFR 0.85) s/p DES placement.  There was a 70% 2nd diag, vessel small for stent.  Plan for DAPT with ASA/Plavix for at least 6 months and consider Plavix as monotherapy after 6 months. The Larry [name] was seen by cardiac rehab while in short stay. There were no observed complications post cath. Radial cath site was re-evaluated prior to discharge and found to be stable without any complications. Instructions/precautions regarding cath site care were given prior to discharge.  Larry [name] omeprazole was stopped due to interactions with Plavix.  Unfortunately, Larry [name] presents for follow-up reports that Larry [name] feels terrible.  Larry [name] has significant bilateral ocular swelling.  Larry [name] is here with Larry [name].  She does most of the talking as Larry [name] feels so poorly.  She states that last week Larry [name] began having a tooth ache for which Larry [name] was seen by Larry [name] dentist with confirmed abscess.  Larry [name] was started on antibiotics however [name] states that Larry [name] has worsened over  the course of several days.  Larry [name] was reseen by Larry [name] dentist yesterday and was told to continue antibiotic therapy.  Larry [name] has now received 4 doses of antibiotics.  [name] reports no fevers however she is concerned for Larry [name] risk of endocarditis given recent cardiac procedure as above.  Larry [name] reports Larry [name] feels bad however feels that Larry [name] all related to Larry [name] tooth abscess.  Larry [name] denies chest pain and states that has shortness of breath is actually improved although Larry [name] is fairly sedentary over the last several days.  Cath site is stable.  Larry [name] is tolerating medications well.  Case discussed with DOD, Dr. Anne [name] to determine of further cardiac work-up needs to be performed in the setting of what looks like significant infection.   Past Medical History:  Diagnosis Date  . Arthritis   . Nerve pain     Past Surgical History:  Procedure Laterality Date  . CORONARY STENT INTERVENTION N/A 10/05/2020   Procedure: CORONARY STENT INTERVENTION;  Surgeon: Larry Crafts, MD;  Location: [facility] INVASIVE CV LAB;  Service: Cardiovascular;  Laterality: N/A;  LAD  . INTRAVASCULAR PRESSURE WIRE/FFR STUDY N/A 10/05/2020   Procedure: INTRAVASCULAR PRESSURE WIRE/FFR STUDY;  Surgeon: Larry Crafts, MD;  Location: [facility] INVASIVE CV LAB;  Service: Cardiovascular;  Laterality: N/A;  . INTRAVASCULAR ULTRASOUND/IVUS N/A 10/05/2020   Procedure: Intravascular Ultrasound/IVUS;  Surgeon: Larry Crafts, MD;  Location: [facility] INVASIVE CV LAB;  Service: Cardiovascular;  Laterality: N/A;  LAD  . LEFT HEART CATH AND CORONARY ANGIOGRAPHY N/A 10/05/2020   Procedure: LEFT HEART CATH AND CORONARY ANGIOGRAPHY;  Surgeon: Larry Crafts, MD;  Location: [facility] INVASIVE CV LAB;  Service: Cardiovascular;  Laterality: N/A;     Current Outpatient Medications  Medication Sig Dispense Refill  . albuterol (VENTOLIN HFA) 108 (90 Base) MCG/ACT inhaler Inhale 2 puffs into the lungs every 6 (six) hours as needed for wheezing or shortness of breath. 8 g 2   . aspirin EC 81 MG tablet Take 81 mg by mouth daily. Swallow whole.    Marland Kitchen atorvastatin (LIPITOR) 80 MG tablet Take 1 tablet (80 mg total) by mouth daily. 90 tablet 3  . buPROPion (WELLBUTRIN XL) 150 MG 24 hr tablet Take 150 mg by mouth daily.    . clopidogrel (PLAVIX) 75 MG tablet Take 1 tablet (75 mg total) by mouth daily. 90 tablet 3  . Diroximel Fumarate (VUMERITY) 231 MG CPDR Take 462 mg by mouth in the morning and at bedtime.     . DULoxetine (CYMBALTA) 60 MG capsule Take 60 mg by mouth daily.  5  . levothyroxine (SYNTHROID) 50 MCG tablet Take 50 mcg by mouth daily before breakfast.    . losartan (COZAAR) 25 MG tablet Take 0.5 tablets (12.5 mg total) by mouth daily. 30 tablet 3  . methocarbamol (ROBAXIN) 750 MG tablet Take 750 mg by mouth 3 (three) times daily.    . midodrine (PROAMATINE) 5 MG tablet Take 5 mg by mouth daily.    . Multiple Vitamin (MULTIVITAMIN WITH MINERALS) TABS tablet Take 1 tablet by mouth daily.    . nitroGLYCERIN (NITROSTAT) 0.4 MG SL tablet Place 1 tablet (0.4 mg total) under the tongue every 5 (five) minutes as needed for chest pain. 25 tablet 3  . pantoprazole (PROTONIX) 40 MG tablet Take 1 tablet (40 mg total) by mouth daily. 90 tablet 3  . pregabalin (LYRICA) 150 MG capsule Take 150 mg by mouth in the morning, at noon, and at bedtime.    Marland Kitchen zolpidem (AMBIEN) 10 MG tablet Take 10 mg by mouth at bedtime.      No current facility-administered medications for this visit.    Allergies:   Aspirin    Social History:  The Larry [name]  reports that Larry [name] has been smoking cigarettes. Larry [name] has never used smokeless tobacco. Larry [name] reports that Larry [name] does not drink alcohol and does not use drugs.   Family History:  The Larry [name]'s family history includes Heart attack (age of onset: 68) in Larry [name] mother.    ROS:  Please see the history of present illness. Otherwise, review of systems are positive for none.   All other systems are reviewed and negative.    PHYSICAL EXAM: VS:  BP 118/80    Pulse 88   Ht 5\' 10"  (1.778 m)   Wt 249 lb (112.9 kg)   SpO2 97%   BMI 35.73 kg/m  , BMI Body mass index is 35.73 kg/m.   General: Ill appearing, facial swelling, NAD Neck: Negative for carotid bruits. No JVD Lungs:Clear to ausculation bilaterally. No wheezes, rales, or rhonchi. Breathing is unlabored. Cardiovascular: RRR with S1 S2. No murmurs Extremities: No edema. Radial pulses 2+ bilaterally Neuro: Alert and oriented. No focal deficits. No facial asymmetry. MAE spontaneously. Psych: Responds to questions appropriately with normal affect.     EKG:  EKG is not ordered today.  Recent Labs: 09/12/2020: B Natriuretic Peptide 370.0 09/13/2020: ALT 48; Magnesium 2.4 10/01/2020: BUN 8; Creatinine, Ser  1.08; Hemoglobin 15.4; Platelets 174; Potassium 4.4; Sodium 142    Lipid Panel No results found for: CHOL, TRIG, HDL, CHOLHDL, VLDL, LDLCALC, LDLDIRECT    Wt Readings from Last 3 Encounters:  10/19/20 249 lb (112.9 kg)  10/05/20 257 lb (116.6 kg)  10/01/20 248 lb (112.5 kg)    Other studies Reviewed: Additional studies/ records that were reviewed today include:  Review of the above records demonstrates:    Cardiac Catheterization 10/05/2020:  CORONARY STENT INTERVENTION  INTRAVASCULAR PRESSURE WIRE/FFR STUDY  Intravascular Ultrasound/IVUS  LEFT HEART CATH AND CORONARY ANGIOGRAPHY  Conclusion    Mid RCA lesion is 100% stenosed. Chronic total occlusion. Right to right and left to right collaterals.  Mid LAD lesion is 70% stenosed. DFR 0.85.  A drug-eluting stent was successfully placed using a SYNERGY XD 3.0X28.  Post intervention, there is a 0% residual stenosis.  2nd Diag lesion is 70% stenosed. This is a small branch vessel, too small for stent.  There is moderate left ventricular systolic dysfunction.  The left ventricular ejection fraction is 35-45% by visual estimate.  LV end diastolic pressure is normal.  There is no aortic valve stenosis.  Continue  aggressive secondary prevention. Consider clopidogrel monotherapy after 6 months given other CAD.  Medical therapy for LV dysfunction. EF should improve with this revascularization since flow to anterior wall and collaterals to RCA has improved.   Diagnostic Dominance: Right  Intervention         ASSESSMENT AND PLAN:  1. CAD s/p PCI to mLAD: -LHC performed 10/05/2020 which showed a chronic total occlusion or mRCA with right to right and left to right collaterals. Mid LAD lesion is 70% stenosed (DFR 0.85) s/p DES placement.  There was a 70% 2nd diag, vessel small for stent.  -DAPT with ASA/Plavix for at least 6 months and consider Plavix as monotherapy after 6 months.  -Denies anginal symptoms although difficult to determine given Larry [name] acute infection/tooth abscess -Discussed with Larry [name] and [name] that Larry [name] is to not stop ASA and Plavix in the setting of recent stenting of tooth/teeth extraction is necessary.  2. Tooth Abscess: -Recently developed multi tooth abscess after for which Larry [name] has been seen by Larry [name] dentis who prescribed abx therapy>>>Larry [name] has not felt any better and presents today with facial swelling.  -We will draw labs today>>CBC with diff however I have recommended that Larry [name] be seen by Larry [name] PCP as well as follow up with Larry [name] dentist  -Per [name], Larry [name] will require tooth/teeth extraction however we discussed that Larry [name] is not to interrupt ASA and Plavix therapies.   2. HLD: -Plan to check at next OV>>pt rushing to leave as Larry [name] feels very poorly here in the office   3. HTN: -Stable, 118/80 -Continue current regimen   4. Tobacco use: -Cessation strongly encouraged   Current medicines are reviewed at length with the Larry [name] today.  The Larry [name] does not have concerns regarding medicines.  The following changes have been made:  no change  Labs/ tests ordered today include: CBC with diff   Orders Placed This Encounter  Procedures  . Basic metabolic panel  . CBC with  Differential/Platelet     Disposition:   [name] with APP in 3 weeks  Signed, Georgie Chard, NP  10/19/2020 12:58 PM    [facility] [address] [place], [place], [place]  [address] Phone: [phone]; Fax: [phone]

## 2020-10-19 ENCOUNTER — Encounter: Payer: Self-pay | Admitting: Cardiology

## 2020-10-19 ENCOUNTER — Ambulatory Visit: Payer: Medicare HMO | Admitting: Cardiology

## 2020-10-19 ENCOUNTER — Other Ambulatory Visit: Payer: Self-pay

## 2020-10-19 VITALS — BP 118/80 | HR 88 | Ht 70.0 in | Wt 249.0 lb

## 2020-10-19 DIAGNOSIS — I429 Cardiomyopathy, unspecified: Secondary | ICD-10-CM | POA: Diagnosis not present

## 2020-10-19 DIAGNOSIS — K047 Periapical abscess without sinus: Secondary | ICD-10-CM | POA: Diagnosis not present

## 2020-10-19 DIAGNOSIS — Z955 Presence of coronary angioplasty implant and graft: Secondary | ICD-10-CM

## 2020-10-19 DIAGNOSIS — Z720 Tobacco use: Secondary | ICD-10-CM

## 2020-10-19 DIAGNOSIS — E782 Mixed hyperlipidemia: Secondary | ICD-10-CM

## 2020-10-19 LAB — BASIC METABOLIC PANEL
BUN/Creatinine Ratio: 6 — ABNORMAL LOW (ref 9–20)
BUN: 8 mg/dL (ref 6–24)
CO2: 23 mmol/L (ref 20–29)
Calcium: 9 mg/dL (ref 8.7–10.2)
Chloride: 98 mmol/L (ref 96–106)
Creatinine, Ser: 1.24 mg/dL (ref 0.76–1.27)
GFR calc Af Amer: 74 mL/min/{1.73_m2} (ref >59)
GFR calc non Af Amer: 64 mL/min/{1.73_m2} (ref >59)
Glucose: 200 mg/dL — ABNORMAL HIGH (ref 65–99)
Potassium: 4.2 mmol/L (ref 3.5–5.2)
Sodium: 136 mmol/L (ref 134–144)

## 2020-10-19 LAB — CBC WITH DIFFERENTIAL/PLATELET
Basophils Absolute: 0.1 10*3/uL (ref 0.0–0.2)
Basos: 1 %
EOS (ABSOLUTE): 0 10*3/uL (ref 0.0–0.4)
Eos: 0 %
Hematocrit: 50.1 % (ref 37.5–51.0)
Hemoglobin: 16.5 g/dL (ref 13.0–17.7)
Immature Grans (Abs): 0 10*3/uL (ref 0.0–0.1)
Immature Granulocytes: 0 %
Lymphocytes Absolute: 1.1 10*3/uL (ref 0.7–3.1)
Lymphs: 9 %
MCH: 29.6 pg (ref 26.6–33.0)
MCHC: 32.9 g/dL (ref 31.5–35.7)
MCV: 90 fL (ref 79–97)
Monocytes Absolute: 1.2 10*3/uL — ABNORMAL HIGH (ref 0.1–0.9)
Monocytes: 9 %
Neutrophils Absolute: 10.2 10*3/uL — ABNORMAL HIGH (ref 1.4–7.0)
Neutrophils: 81 %
Platelets: 149 10*3/uL — ABNORMAL LOW (ref 150–450)
RBC: 5.57 x10E6/uL (ref 4.14–5.80)
RDW: 13.5 % (ref 11.6–15.4)
WBC: 12.7 10*3/uL — ABNORMAL HIGH (ref 3.4–10.8)

## 2020-10-19 NOTE — Patient Instructions (Addendum)
Medication Instructions:  Your physician recommends that you continue on your current medications as directed. Please refer to the Current Medication list given to you today.  *If you need a refill on your cardiac medications before your next appointment, please call your pharmacy*   Lab Work: CBC/w diff and a BMET today If you have labs (blood work) drawn today and your tests are completely normal, you will receive your results only by: Marland Kitchen MyChart Message (if you have MyChart) OR . A paper copy in the mail If you have any lab test that is abnormal or we need to change your treatment, we will call you to review the results.   Testing/Procedures: None   Follow-Up: At [facility], you and your health needs are our priority.  As part of our continuing mission to provide you with exceptional heart care, we have created designated Provider Care Teams.  These Care Teams include your primary Cardiologist (physician) and Advanced Practice Providers (APPs -  Physician Assistants and Nurse Practitioners) who all work together to provide you with the care you need, when you need it.   Your next appointment:     You are scheduled to see Dr Mayford Knife on 10/29/2020 at 3:00 PM.  Georgie Chard recommends that you see your primary care provider soon.

## 2020-10-22 ENCOUNTER — Telehealth: Payer: Self-pay

## 2020-10-22 NOTE — Telephone Encounter (Signed)
The patient has been notified of the result and verbalized understanding.  All questions (if any) were answered. Leanord Hawking, RN 10/22/2020 9:10 AM

## 2020-10-22 NOTE — Telephone Encounter (Signed)
-----   Message from Filbert Schilder, NP sent at 10/22/2020  8:57 AM EDT ----- Please let the patient know that his WBC count was elevated at our visit. He needs to follow with his dentist and PCP as we had discussed

## 2020-10-29 ENCOUNTER — Ambulatory Visit (INDEPENDENT_AMBULATORY_CARE_PROVIDER_SITE_OTHER): Payer: Medicare HMO | Admitting: Cardiology

## 2020-10-29 ENCOUNTER — Encounter: Payer: Self-pay | Admitting: Cardiology

## 2020-10-29 ENCOUNTER — Ambulatory Visit: Payer: Medicare HMO | Admitting: Cardiology

## 2020-10-29 ENCOUNTER — Other Ambulatory Visit: Payer: Self-pay

## 2020-10-29 VITALS — BP 120/70 | HR 73 | Ht 70.0 in | Wt 253.8 lb

## 2020-10-29 DIAGNOSIS — I2510 Atherosclerotic heart disease of native coronary artery without angina pectoris: Secondary | ICD-10-CM

## 2020-10-29 DIAGNOSIS — I10 Essential (primary) hypertension: Secondary | ICD-10-CM

## 2020-10-29 DIAGNOSIS — E782 Mixed hyperlipidemia: Secondary | ICD-10-CM

## 2020-10-29 DIAGNOSIS — Z720 Tobacco use: Secondary | ICD-10-CM | POA: Diagnosis not present

## 2020-10-29 DIAGNOSIS — R55 Syncope and collapse: Secondary | ICD-10-CM

## 2020-10-29 NOTE — Patient Instructions (Addendum)
Medication Instructions:  Your physician recommends that you continue on your current medications as directed. Please refer to the Current Medication list given to you today.  *If you need a refill on your cardiac medications before your next appointment, please call your pharmacy*   Lab Work: Fasting lipids and ALT If you have labs (blood work) drawn today and your tests are completely normal, you will receive your results only by: Marland Kitchen MyChart Message (if you have MyChart) OR . A paper copy in the mail If you have any lab test that is abnormal or we need to change your treatment, we will call you to review the results.  Follow-Up: At [facility], you and your health needs are our priority.  As part of our continuing mission to provide you with exceptional heart care, we have created designated Provider Care Teams.  These Care Teams include your primary Cardiologist (physician) and Advanced Practice Providers (APPs -  Physician Assistants and Nurse Practitioners) who all work together to provide you with the care you need, when you need it.  Your next appointment:   6 month(s)  The format for your next appointment:   In Person  Provider:   You may see Armanda Magic, MD or one of the following Advanced Practice Providers on your designated Care Team:    Ronie Spies, PA-C  Jacolyn Reedy, PA-C  Other Instructions You have been referred to see an Electrophysiologist, Dr. Lalla Brothers.     Heart-Healthy Eating Plan Many factors influence your heart (coronary) health, including eating and exercise habits. Coronary risk increases with abnormal blood fat (lipid) levels. Heart-healthy meal planning includes limiting unhealthy fats, increasing healthy fats, and making other diet and lifestyle changes.  What are tips for following this plan? Cooking Cook foods using methods other than frying. Baking, boiling, grilling, and broiling are all good options. Other ways to reduce fat  include:  Removing the skin from poultry.  Removing all visible fats from meats.  Steaming vegetables in water or broth. Meal planning   At meals, imagine dividing your plate into fourths: ? Fill one-half of your plate with vegetables and green salads. ? Fill one-fourth of your plate with whole grains. ? Fill one-fourth of your plate with lean protein foods.  Eat 4-5 servings of vegetables per day. One serving equals 1 cup raw or cooked vegetable, or 2 cups raw leafy greens.  Eat 4-5 servings of fruit per day. One serving equals 1 medium whole fruit,  cup dried fruit,  cup fresh, frozen, or canned fruit, or  cup 100% fruit juice.  Eat more foods that contain soluble fiber. Examples include apples, broccoli, carrots, beans, peas, and barley. Aim to get 25-30 g of fiber per day.  Increase your consumption of legumes, nuts, and seeds to 4-5 servings per week. One serving of dried beans or legumes equals  cup cooked, 1 serving of nuts is  cup, and 1 serving of seeds equals 1 tablespoon. Fats  Choose healthy fats more often. Choose monounsaturated and polyunsaturated fats, such as olive and canola oils, flaxseeds, walnuts, almonds, and seeds.  Eat more omega-3 fats. Choose salmon, mackerel, sardines, tuna, flaxseed oil, and ground flaxseeds. Aim to eat fish at least 2 times each week.  Check food labels carefully to identify foods with trans fats or high amounts of saturated fat.  Limit saturated fats. These are found in animal products, such as meats, butter, and cream. Plant sources of saturated fats include palm oil, palm kernel oil,  and coconut oil.  Avoid foods with partially hydrogenated oils in them. These contain trans fats. Examples are stick margarine, some tub margarines, cookies, crackers, and other baked goods.  Avoid fried foods. General information  Eat more home-cooked food and less restaurant, buffet, and fast food.  Limit or avoid alcohol.  Limit foods that  are high in starch and sugar.  Lose weight if you are overweight. Losing just 5-10% of your body weight can help your overall health and prevent diseases such as diabetes and heart disease.  Monitor your salt (sodium) intake, especially if you have high blood pressure. Talk with your health care provider about your sodium intake.  Try to incorporate more vegetarian meals weekly. What foods can I eat? Fruits All fresh, canned (in natural juice), or frozen fruits. Vegetables Fresh or frozen vegetables (raw, steamed, roasted, or grilled). Green salads. Grains Most grains. Choose whole wheat and whole grains most of the time. Rice and pasta, including brown rice and pastas made with whole wheat. Meats and other proteins Lean, well-trimmed beef, veal, pork, and lamb. Chicken and [place] without skin. All fish and shellfish. Wild duck, rabbit, pheasant, and venison. Egg whites or low-cholesterol egg substitutes. Dried beans, peas, lentils, and tofu. Seeds and most nuts. Dairy Low-fat or nonfat cheeses, including ricotta and mozzarella. Skim or 1% milk (liquid, powdered, or evaporated). Buttermilk made with low-fat milk. Nonfat or low-fat yogurt. Fats and oils Non-hydrogenated (trans-free) margarines. Vegetable oils, including soybean, sesame, sunflower, olive, peanut, safflower, corn, canola, and cottonseed. Salad dressings or mayonnaise made with a vegetable oil. Beverages Water (mineral or sparkling). Coffee and tea. Diet carbonated beverages. Sweets and desserts Sherbet, gelatin, and fruit ice. Small amounts of dark chocolate. Limit all sweets and desserts. Seasonings and condiments All seasonings and condiments. The items listed above may not be a complete list of foods and beverages you can eat. Contact a dietitian for more options. What foods are not recommended? Fruits Canned fruit in heavy syrup. Fruit in cream or butter sauce. Fried fruit. Limit coconut. Vegetables Vegetables cooked  in cheese, cream, or butter sauce. Fried vegetables. Grains Breads made with saturated or trans fats, oils, or whole milk. Croissants. Sweet rolls. Donuts. High-fat crackers, such as cheese crackers. Meats and other proteins Fatty meats, such as hot dogs, ribs, sausage, bacon, rib-eye roast or steak. High-fat deli meats, such as salami and bologna. Caviar. Domestic duck and goose. Organ meats, such as liver. Dairy Cream, sour cream, cream cheese, and creamed cottage cheese. Whole milk cheeses. Whole or 2% milk (liquid, evaporated, or condensed). Whole buttermilk. Cream sauce or high-fat cheese sauce. Whole-milk yogurt. Fats and oils Meat fat, or shortening. Cocoa butter, hydrogenated oils, palm oil, coconut oil, palm kernel oil. Solid fats and shortenings, including bacon fat, salt pork, lard, and butter. Nondairy cream substitutes. Salad dressings with cheese or sour cream. Beverages Regular sodas and any drinks with added sugar. Sweets and desserts Frosting. Pudding. Cookies. Cakes. Pies. Milk chocolate or white chocolate. Buttered syrups. Full-fat ice cream or ice cream drinks. The items listed above may not be a complete list of foods and beverages to avoid. Contact a dietitian for more information. Summary  Heart-healthy meal planning includes limiting unhealthy fats, increasing healthy fats, and making other diet and lifestyle changes.  Lose weight if you are overweight. Losing just 5-10% of your body weight can help your overall health and prevent diseases such as diabetes and heart disease.  Focus on eating a balance of foods, including fruits  and vegetables, low-fat or nonfat dairy, lean protein, nuts and legumes, whole grains, and heart-healthy oils and fats. This information is not intended to replace advice given to you by your health care provider. Make sure you discuss any questions you have with your health care provider. Document Revised: 01/11/2018 Document Reviewed:  01/11/2018 Elsevier Patient Education  2020 [employer].

## 2020-10-29 NOTE — Progress Notes (Signed)
Cardiology Office Note   Date:  10/29/2020   ID:  Larry [name], DOB 1961-08-21, MRN [id number]  PCP:  Roberts Gaudy., MD  Cardiologist: Dr. Armanda Magic, MD  Chief Complaint  Patient presents with  . Coronary Artery Disease  . Hyperlipidemia    History of Present Illness: Mcihael Hinderman [name] is a 59 y.o. male who presents for post same day PCI, seen for Dr. Mayford Knife.  Mr. Clingerman is a 59 y.o. male with hx of MS, OSA on CPAP, peripheral neuropathy, HTN with orthostatic hypotension, HLD, DM2,  tobacco use, and recent RSV  who recently underwent outpatient cardiac catheterization secondary to positive cardiac CTA showing total RCA disease, LAD with 50 to 69% stenosis, CT FFR across the lesion showing 0.61 plaque with high risk features.  LCx noted with a 50 to 69% stenosis and CT FFR at 0.76.  Given this, OP LHC was arranged.    He underwent cardiac catheterization 10/05/2020 which showed a chronic total occlusion or mRCA with right to right and left to right collaterals. Mid LAD lesion is 70% stenosed (DFR 0.85) s/p DES placement.  There was a 70% 2nd diag, vessel small for stent.  Plan for DAPT with ASA/Plavix for at least 6 months and consider Plavix as monotherapy after 6 months. The patient was seen by cardiac rehab while in short stay. There were no observed complications post cath. Radial cath site was re-evaluated prior to discharge and found to be stable without any complications. Instructions/precautions regarding cath site care were given prior to discharge.  He has a hx of syncope which prompted admission to hospital.  This occurred while standing emptying some buckets.  He then had another episode of syncope that occurred while sitting in his chair and suddenly had a syncopal episode with no prodrome.   He is here today for followup and is doing well.  He denies any chest pain or pressure,  PND, orthopnea, LE edema, palpitations or syncope. He has chronic DOE that he attributes  to obesity and sedentary state.  He has not had any further syncopal episodes but still has significant dizziness when he stands up too fast. He has not had any further syncope.  He is compliant with his meds and is tolerating meds with no SE.    Past Medical History:  Diagnosis Date  . Arthritis   . Nerve pain     Past Surgical History:  Procedure Laterality Date  . CORONARY STENT INTERVENTION N/A 10/05/2020   Procedure: CORONARY STENT INTERVENTION;  Surgeon: Corky Crafts, MD;  Location: [facility] INVASIVE CV LAB;  Service: Cardiovascular;  Laterality: N/A;  LAD  . INTRAVASCULAR PRESSURE WIRE/FFR STUDY N/A 10/05/2020   Procedure: INTRAVASCULAR PRESSURE WIRE/FFR STUDY;  Surgeon: Corky Crafts, MD;  Location: [facility] INVASIVE CV LAB;  Service: Cardiovascular;  Laterality: N/A;  . INTRAVASCULAR ULTRASOUND/IVUS N/A 10/05/2020   Procedure: Intravascular Ultrasound/IVUS;  Surgeon: Corky Crafts, MD;  Location: [facility] INVASIVE CV LAB;  Service: Cardiovascular;  Laterality: N/A;  LAD  . LEFT HEART CATH AND CORONARY ANGIOGRAPHY N/A 10/05/2020   Procedure: LEFT HEART CATH AND CORONARY ANGIOGRAPHY;  Surgeon: Corky Crafts, MD;  Location: [facility] INVASIVE CV LAB;  Service: Cardiovascular;  Laterality: N/A;     Current Outpatient Medications  Medication Sig Dispense Refill  . albuterol (VENTOLIN HFA) 108 (90 Base) MCG/ACT inhaler Inhale 2 puffs into the lungs every 6 (six) hours as needed for wheezing or shortness of breath. 8 g  2  . aspirin EC 81 MG tablet Take 81 mg by mouth daily. Swallow whole.    Marland Kitchen atorvastatin (LIPITOR) 80 MG tablet Take 1 tablet (80 mg total) by mouth daily. 90 tablet 3  . buPROPion (WELLBUTRIN XL) 150 MG 24 hr tablet Take 150 mg by mouth daily.    . clopidogrel (PLAVIX) 75 MG tablet Take 1 tablet (75 mg total) by mouth daily. 90 tablet 3  . Diroximel Fumarate (VUMERITY) 231 MG CPDR Take 462 mg by mouth in the morning and at bedtime.     . DULoxetine (CYMBALTA) 60 MG  capsule Take 60 mg by mouth daily.  5  . levothyroxine (SYNTHROID) 50 MCG tablet Take 50 mcg by mouth daily before breakfast.    . losartan (COZAAR) 25 MG tablet Take 0.5 tablets (12.5 mg total) by mouth daily. 30 tablet 3  . methocarbamol (ROBAXIN) 750 MG tablet Take 750 mg by mouth 3 (three) times daily.    . midodrine (PROAMATINE) 5 MG tablet Take 5 mg by mouth daily.    . Multiple Vitamin (MULTIVITAMIN WITH MINERALS) TABS tablet Take 1 tablet by mouth daily.    . nitroGLYCERIN (NITROSTAT) 0.4 MG SL tablet Place 1 tablet (0.4 mg total) under the tongue every 5 (five) minutes as needed for chest pain. 25 tablet 3  . pantoprazole (PROTONIX) 40 MG tablet Take 1 tablet (40 mg total) by mouth daily. 90 tablet 3  . pregabalin (LYRICA) 150 MG capsule Take 150 mg by mouth in the morning, at noon, and at bedtime.    Marland Kitchen zolpidem (AMBIEN) 10 MG tablet Take 10 mg by mouth at bedtime.      No current facility-administered medications for this visit.    Allergies:   Aspirin    Social History:  The patient  reports that he has been smoking cigarettes. He has never used smokeless tobacco. He reports that he does not drink alcohol and does not use drugs.   Family History:  The patient's family history includes Heart attack (age of onset: 66) in his mother.    ROS:  Please see the history of present illness. Otherwise, review of systems are positive for none.   All other systems are reviewed and negative.    PHYSICAL EXAM: VS:  BP 120/70   Pulse 73   Ht 5\' 10"  (1.778 m)   Wt 253 lb 12.8 oz (115.1 kg)   SpO2 92%   BMI 36.42 kg/m  , BMI Body mass index is 36.42 kg/m.   GEN: Well nourished, well developed in no acute distress HEENT: Normal NECK: No JVD; No carotid bruits LYMPHATICS: No lymphadenopathy CARDIAC:RRR, no murmurs, rubs, gallops RESPIRATORY:  Clear to auscultation without rales, wheezing or rhonchi  ABDOMEN: Soft, non-tender, non-distended MUSCULOSKELETAL:  No edema; No deformity    SKIN: Warm and dry NEUROLOGIC:  Alert and oriented x 3 PSYCHIATRIC:  Normal affect   EKG:  EKG is not ordered today.  Recent Labs: 09/12/2020: B Natriuretic Peptide 370.0 09/13/2020: ALT 48; Magnesium 2.4 10/19/2020: BUN 8; Creatinine, Ser 1.24; Hemoglobin 16.5; Platelets 149; Potassium 4.2; Sodium 136    Lipid Panel No results found for: CHOL, TRIG, HDL, CHOLHDL, VLDL, LDLCALC, LDLDIRECT    Wt Readings from Last 3 Encounters:  10/29/20 253 lb 12.8 oz (115.1 kg)  10/19/20 249 lb (112.9 kg)  10/05/20 257 lb (116.6 kg)    Other studies Reviewed: Additional studies/ records that were reviewed today include:  Review of the above records demonstrates:  Cardiac Catheterization 10/05/2020:  CORONARY STENT INTERVENTION  INTRAVASCULAR PRESSURE WIRE/FFR STUDY  Intravascular Ultrasound/IVUS  LEFT HEART CATH AND CORONARY ANGIOGRAPHY  Conclusion    Mid RCA lesion is 100% stenosed. Chronic total occlusion. Right to right and left to right collaterals.  Mid LAD lesion is 70% stenosed. DFR 0.85.  A drug-eluting stent was successfully placed using a SYNERGY XD 3.0X28.  Post intervention, there is a 0% residual stenosis.  2nd Diag lesion is 70% stenosed. This is a small branch vessel, too small for stent.  There is moderate left ventricular systolic dysfunction.  The left ventricular ejection fraction is 35-45% by visual estimate.  LV end diastolic pressure is normal.  There is no aortic valve stenosis.  Continue aggressive secondary prevention. Consider clopidogrel monotherapy after 6 months given other CAD.  Medical therapy for LV dysfunction. EF should improve with this revascularization since flow to anterior wall and collaterals to RCA has improved.   Diagnostic Dominance: Right  Intervention         ASSESSMENT AND PLAN:  1. CAD s/p PCI to mLAD: -LHC performed 10/05/2020 which showed a chronic total occlusion or mRCA with right to right and left  to right collaterals. Mid LAD lesion is 70% stenosed (DFR 0.85) s/p DES placement.  There was a 70% 2nd diag, vessel small for stent.  -he has not had any further anginal chest pain -continue DAPT with ASA/Plavix for at least 6 months and consider Plavix as monotherapy after 6 months.  -continue statin -no BB due to orthostatic hypotension  2. HLD: -LDL goal < 70 -check FLP and ALT -continue atorvastatin 80mg  daily  3. HTN: -Bp controlled on exam today -Continue Losartan 25mg  daily -SCr stable at 1.24  4. Tobacco use: -Cessation strongly encouraged  5.  Syncope/NSVT -he has had several episdoes of syncope prior to his heart cath. The first episode occurred while standing up emptying some buckets.  The other episode occurred when he was sitting in a chair and just fell out with no prodrome. -event monitor showed episodes of NSVT but no bradyarrhythmias -orthostatics on exam today  -given his orthostatic hypotension, cannot add BB or CCB to suppress VT -I think the first syncopal spell occurred due to orthostatic hypotension but I am concerned about the second episode that occurred while sitting in a chair with no prodrome.  Will refer to EP for evaluation   Signed, , MD  10/29/2020 3:09 PM    [facility] [address] [place], [place], [place]  [place] Phone: [phone]; Fax: [phone]

## 2020-11-01 ENCOUNTER — Telehealth (HOSPITAL_COMMUNITY): Payer: Self-pay

## 2020-11-01 NOTE — Telephone Encounter (Signed)
Faxed cardiac rehab referral to [place] [facility] per phase I. °

## 2020-11-01 NOTE — Telephone Encounter (Signed)
Faxed cardiac rehab referral to [facility] with [facility] per phase I.

## 2020-11-02 ENCOUNTER — Other Ambulatory Visit: Payer: Self-pay

## 2020-11-02 ENCOUNTER — Other Ambulatory Visit: Payer: Medicare HMO

## 2020-11-02 ENCOUNTER — Telehealth: Payer: Self-pay

## 2020-11-02 DIAGNOSIS — E782 Mixed hyperlipidemia: Secondary | ICD-10-CM

## 2020-11-02 LAB — LIPID PANEL
Chol/HDL Ratio: 4.3 ratio (ref 0.0–5.0)
Cholesterol, Total: 132 mg/dL (ref 100–199)
HDL: 31 mg/dL — ABNORMAL LOW (ref >39)
LDL Chol Calc (NIH): 78 mg/dL (ref 0–99)
Triglycerides: 124 mg/dL (ref 0–149)
VLDL Cholesterol Cal: 23 mg/dL (ref 5–40)

## 2020-11-02 LAB — ALT: ALT: 26 IU/L (ref 0–44)

## 2020-11-02 MED ORDER — EZETIMIBE 10 MG PO TABS: 10.0000 mg | ORAL_TABLET | Freq: Every day | ORAL | 3 refills | Status: DC

## 2020-11-02 NOTE — Telephone Encounter (Signed)
-----   Message from Quintella Reichert, MD sent at 11/02/2020  4:41 PM EST ----- LDL not at goal, add Zetia 10mg  daily and repeat FLp and ALT in 6 weeks

## 2020-11-02 NOTE — Telephone Encounter (Signed)
The patient's wife has been notified of the result and verbalized understanding.  All questions (if any) were answered. Theresia Majors, RN 11/02/2020 5:11 PM  Rx has been sent in for zetia 10 mg daily and patient has been scheduled for repeat lab work.

## 2020-11-08 ENCOUNTER — Ambulatory Visit (INDEPENDENT_AMBULATORY_CARE_PROVIDER_SITE_OTHER): Payer: Medicare HMO | Admitting: Cardiology

## 2020-11-08 ENCOUNTER — Encounter: Payer: Self-pay | Admitting: Cardiology

## 2020-11-08 ENCOUNTER — Other Ambulatory Visit: Payer: Self-pay

## 2020-11-08 VITALS — BP 138/70 | HR 67 | Ht 70.0 in | Wt 253.6 lb

## 2020-11-08 DIAGNOSIS — R55 Syncope and collapse: Secondary | ICD-10-CM | POA: Diagnosis not present

## 2020-11-08 DIAGNOSIS — I2510 Atherosclerotic heart disease of native coronary artery without angina pectoris: Secondary | ICD-10-CM | POA: Diagnosis not present

## 2020-11-08 NOTE — Patient Instructions (Addendum)
Medication Instructions:  Your physician recommends that you continue on your current medications as directed. Please refer to the Current Medication list given to you today.  Labwork: None ordered.  Testing/Procedures: You will have a cardiac MRI.   Magnetic resonance imaging (MRI) is a painless test that produces images of the inside of the body without using X-rays. During an MRI, strong magnets and radio waves work together in a Data processing manager to form detailed images. MRI images may provide more details about a medical condition than X-rays, CT scans, and ultrasounds can provide.   Follow-Up: Your physician wants you to follow-up in: 6 weeks with Dr. Lalla Brothers.     December 21, 2020 at 8:45 am at the [facility] office     Any Other Special Instructions Will Be Listed Below (If Applicable).  If you need a refill on your cardiac medications before your next appointment, please call your pharmacy.   You are scheduled for Cardiac MRI on ___________. Please arrive at the [facility] main entrance of [facility] at ______________(30-45 minutes prior to test start time). ?  [facility]  [address]  [place], [place] [address]  [phone]  Proceed to the [facility] Radiology Department (First Floor).  ?  Magnetic resonance imaging (MRI) is a painless test that produces images of the inside of the body without using X-rays. During an MRI, strong magnets and radio waves work together in a Data processing manager to form detailed images. MRI images may provide more details about a medical condition than X-rays, CT scans, and ultrasounds can provide.   You may be given earphones to listen for instructions.   You may eat a light breakfast and take medications as ordered.  If a contrast material will be used, an IV will be inserted into one of your veins. Contrast material will be injected into your IV.  You will be asked to remove all metal, including: Watch, jewelry, and other  metal objects including hearing aids, hair pieces and dentures. (Braces and fillings normally are not a problem.)  If contrast material was used:  It will leave your body through your urine within a day. You may be told to drink plenty of fluids to help flush the contrast material out of your system.  TEST WILL TAKE APPROXIMATELY 1 HOUR  PLEASE NOTIFY SCHEDULING AT LEAST 24 HOURS IN ADVANCE IF YOU ARE UNABLE TO KEEP YOUR APPOINTMENT.

## 2020-11-08 NOTE — Progress Notes (Signed)
Electrophysiology Office Note:    Date:  11/08/2020   ID:  Larry [name], DOB 01/28/1961, MRN [id number]  PCP:  Roberts Gaudy., MD  [facility] Cardiologist:  Armanda Magic, MD  [facility] Electrophysiologist:  None   Referring MD: Quintella Reichert, MD   Chief Complaint: Syncope  History of Present Illness:    Larry [name] is a 59 y.o. male who presents for an evaluation of syncope at the request of Dr. Mayford Knife.   The patient last saw Dr. Mayford Knife on October 29, 2020.  This appointment was in follow-up after recent PCI.  He has a history of multiple sclerosis, sleep apnea on CPAP, peripheral neuropathy, hypertension, orthostatic hypotension, hyperlipidemia, diabetes, tobacco use.  After recent cardiac CTA showed significant coronary disease he underwent an outpatient heart catheterization.  During the heart catheterization a mid LAD stenosis was stented.  Medical management was recommended for a residual diagonal lesion.  There was a chronic total occlusion of the mid right coronary artery.  Patient has a history of multiple episodes of syncope and orthostatic intolerance.  These appear to be 2 distinct events for the patient.  There are episodes that are very suggestive of orthostatic intolerance.  He describes these as occurring shortly after standing from a seated position.  Sometimes as quick as immediately upon a standing or sometimes it takes 2 or 3 steps before he has a clear prodrome of lightheadedness and dizziness.  Some of the episodes result in loss of consciousness but most of the time these are just severe dizziness and he has to immediately sit back down.  There are other episodes which are much more concerning.  He describes one [place] and emptying them.  He says that he was moving the buckets of water after he had already been standing for quite some time and had an abrupt loss of consciousness.  He describes it as  "like the lights went out".  There is another episode recently when he was seated in a chair.  He thinks he may have been reaching for something and then had an abrupt loss of consciousness and woke up almost with his head inside of a trash can according to his wife.  Past Medical History:  Diagnosis Date  . Arthritis   . Nerve pain     Past Surgical History:  Procedure Laterality Date  . CORONARY STENT INTERVENTION N/A 10/05/2020   Procedure: CORONARY STENT INTERVENTION;  Surgeon: Corky Crafts, MD;  Location: [facility] INVASIVE CV LAB;  Service: Cardiovascular;  Laterality: N/A;  LAD  . INTRAVASCULAR PRESSURE WIRE/FFR STUDY N/A 10/05/2020   Procedure: INTRAVASCULAR PRESSURE WIRE/FFR STUDY;  Surgeon: Corky Crafts, MD;  Location: [facility] INVASIVE CV LAB;  Service: Cardiovascular;  Laterality: N/A;  . INTRAVASCULAR ULTRASOUND/IVUS N/A 10/05/2020   Procedure: Intravascular Ultrasound/IVUS;  Surgeon: Corky Crafts, MD;  Location: [facility] INVASIVE CV LAB;  Service: Cardiovascular;  Laterality: N/A;  LAD  . LEFT HEART CATH AND CORONARY ANGIOGRAPHY N/A 10/05/2020   Procedure: LEFT HEART CATH AND CORONARY ANGIOGRAPHY;  Surgeon: Corky Crafts, MD;  Location: [facility] INVASIVE CV LAB;  Service: Cardiovascular;  Laterality: N/A;    Current Medications: Current Meds  Medication Sig  . albuterol (VENTOLIN HFA) 108 (90 Base) MCG/ACT inhaler Inhale 2 puffs into the lungs every 6 (six) hours as needed for wheezing or shortness of breath.  Marland Kitchen aspirin EC 81 MG tablet Take 81 mg by  mouth daily. Swallow whole.  Marland Kitchen atorvastatin (LIPITOR) 80 MG tablet Take 1 tablet (80 mg total) by mouth daily.  Marland Kitchen buPROPion (WELLBUTRIN XL) 150 MG 24 hr tablet Take 150 mg by mouth daily.  . clopidogrel (PLAVIX) 75 MG tablet Take 1 tablet (75 mg total) by mouth daily.  . Diroximel Fumarate (VUMERITY) 231 MG CPDR Take 462 mg by mouth in the morning and at bedtime.   . DULoxetine (CYMBALTA) 60 MG capsule Take 60 mg by mouth  daily.  Marland Kitchen ezetimibe (ZETIA) 10 MG tablet Take 1 tablet (10 mg total) by mouth daily.  Marland Kitchen levothyroxine (SYNTHROID) 50 MCG tablet Take 50 mcg by mouth daily before breakfast.  . losartan (COZAAR) 25 MG tablet Take 0.5 tablets (12.5 mg total) by mouth daily.  . methocarbamol (ROBAXIN) 750 MG tablet Take 750 mg by mouth 3 (three) times daily.  . midodrine (PROAMATINE) 5 MG tablet Take 5 mg by mouth daily.  . Multiple Vitamin (MULTIVITAMIN WITH MINERALS) TABS tablet Take 1 tablet by mouth daily.  . nitroGLYCERIN (NITROSTAT) 0.4 MG SL tablet Place 1 tablet (0.4 mg total) under the tongue every 5 (five) minutes as needed for chest pain.  . pantoprazole (PROTONIX) 40 MG tablet Take 1 tablet (40 mg total) by mouth daily.  . pregabalin (LYRICA) 150 MG capsule Take 150 mg by mouth in the morning, at noon, and at bedtime.  Marland Kitchen zolpidem (AMBIEN) 10 MG tablet Take 10 mg by mouth at bedtime.      Allergies:   Aspirin   Social History   Socioeconomic History  . Marital status: Married    Spouse name: Not on file  . Number of children: Not on file  . Years of education: Not on file  . Highest education level: Not on file  Occupational History  . Not on file  Tobacco Use  . Smoking status: Current Every Day Smoker    Types: Cigarettes  . Smokeless tobacco: Never Used  Substance and Sexual Activity  . Alcohol use: No  . Drug use: No  . Sexual activity: Not on file  Other Topics Concern  . Not on file  Social History Narrative  . Not on file   Social Determinants of Health   Financial Resource Strain:   . Difficulty of Paying Living Expenses: Not on file  Food Insecurity:   . Worried About Programme researcher, broadcasting/film/video in the Last Year: Not on file  . Ran Out of Food in the Last Year: Not on file  Transportation Needs:   . Lack of Transportation (Medical): Not on file  . Lack of Transportation (Non-Medical): Not on file  Physical Activity:   . Days of Exercise per Week: Not on file  . Minutes of  Exercise per Session: Not on file  Stress:   . Feeling of Stress : Not on file  Social Connections:   . Frequency of Communication with Friends and Family: Not on file  . Frequency of Social Gatherings with Friends and Family: Not on file  . Attends Religious Services: Not on file  . Active Member of Clubs or Organizations: Not on file  . Attends Banker Meetings: Not on file  . Marital Status: Not on file     Family History: The patient's family history includes Heart attack (age of onset: 9) in his mother. There is no history of Lung cancer.  ROS:   Please see the history of present illness.    All other systems reviewed  and are negative.  EKGs/Labs/Other Studies Reviewed:    The following studies were reviewed today: Prior notes, prior echo, cath, Holter report  October 27, 2020 Holter personally reviewed  30 days monitored  1 episode of NSVT lasting 5 beats with a heart rate less than 150 bpm  Sinus bradycardia, normal sinus rhythm and sinus tachycardia. The average heart rate was 75bpm and ranged from 56-134bpm.  Several episodes of nonsustained ventricular tachycardia.  Occsaional PVC  September 12, 2020 echo personally reviewed Very poor image quality.  I will feel comfortable making a determination about the left ventricular function based on his echo.   October 05, 2020 heart catheterization personally reviewed  Mid RCA lesion is 100% stenosed. Chronic total occlusion. Right to right and left to right collaterals.  Mid LAD lesion is 70% stenosed. DFR 0.85.  A drug-eluting stent was successfully placed using a SYNERGY XD 3.0X28.  Post intervention, there is a 0% residual stenosis.  2nd Diag lesion is 70% stenosed. This is a small branch vessel, too small for stent.  There is moderate left ventricular systolic dysfunction.  The left ventricular ejection fraction is 35-45% by visual estimate.  LV end diastolic pressure is normal.  There  is no aortic valve stenosis.     Recent Labs: 09/12/2020: B Natriuretic Peptide 370.0 09/13/2020: Magnesium 2.4 10/19/2020: BUN 8; Creatinine, Ser 1.24; Hemoglobin 16.5; Platelets 149; Potassium 4.2; Sodium 136 11/02/2020: ALT 26  Recent Lipid Panel    Component Value Date/Time   CHOL 132 11/02/2020 0947   TRIG 124 11/02/2020 0947   HDL 31 (L) 11/02/2020 0947   CHOLHDL 4.3 11/02/2020 0947   LDLCALC 78 11/02/2020 0947    Physical Exam:    VS:  BP 138/70   Pulse 67   Ht 5\' 10"  (1.778 m)   Wt 253 lb 9.6 oz (115 kg)   SpO2 99%   BMI 36.39 kg/m     Wt Readings from Last 3 Encounters:  11/08/20 253 lb 9.6 oz (115 kg)  10/29/20 253 lb 12.8 oz (115.1 kg)  10/19/20 249 lb (112.9 kg)     GEN: Well nourished, well developed in no acute distress.  HEENT: Normal NECK: No JVD; No carotid bruits LYMPHATICS: No lymphadenopathy CARDIAC: RRR, no murmurs, rubs, gallops RESPIRATORY:  Clear to auscultation without rales, wheezing or rhonchi  ABDOMEN: Soft, non-tender, non-distended MUSCULOSKELETAL:  No edema; No deformity  SKIN: Warm and dry NEUROLOGIC:  Alert and oriented x 3 PSYCHIATRIC:  Normal affect   ASSESSMENT:    1. Syncope and collapse   2. Coronary artery disease without angina pectoris, unspecified vessel or lesion type, unspecified whether native or transplanted heart    PLAN:    In order of problems listed above:  1. Syncope Patient has 2 distinct types of syncope that he describes during the visit today.  One of his types of syncope is clearly related to orthostatic intolerance and is the least concerning.  He has a consistent prodrome with these episodes and is able to to sit back down prior to any loss of consciousness.  His other syncopal episodes are abrupt and come without prodromal symptoms.  These episodes have occurred while seated or standing.  No clear precipitating triggers.  No feelings of palpitations.  Given the patient's extensive coronary history  including a CTO of the right coronary artery and associated hypokinesis on ventriculogram I am concerned that he is having ventricular arrhythmias leading to his syncope.  Unfortunately his recent echo was  nondiagnostic.  I would like to order a cardiac MRI to better quantify his left ventricular function and to assess for fibrosis of the LV which will help risk stratify the patient.  I will plan to see him back after the cardiac MRI to review the results and further risk stratify.  If the MRI shows a significantly decreased left ventricular function, we will proceed with implanting a defibrillator.  If the MRI shows preserved left ventricular function, we will plan on further restratification using an EP study with a ventricular stimulation protocol to assess for inducible ventricular arrhythmias.  If this was positive for ventricular arrhythmias, would plan on implanting a cardiac defibrillator.  The above workflow was discussed in detail with the patient and his wife during today's visit.  The risks and benefits of EP study and ICD implant were discussed at length with the patient today.  They are agreeable to the above plan.  I have advised the patient to avoid driving or operating heavy machinery for 6 months given the unexplained syncopal episode.  2.  Coronary artery disease Continue aspirin, atorvastatin, Plavix  Depending on the results of the cardiac MRI will likely need referral to the heart failure clinic for further medication optimization.  Medication Adjustments/Labs and Tests Ordered: Current medicines are reviewed at length with the patient today.  Concerns regarding medicines are outlined above.  Orders Placed This Encounter  Procedures  . MR Card Morphology Wo/W Cm  . EKG 12-Lead   No orders of the defined types were placed in this encounter.    Signed, Steffanie Dunn, MD, [facility]  11/08/2020 4:08 PM    Electrophysiology [facility]

## 2020-11-09 ENCOUNTER — Encounter: Payer: Self-pay | Admitting: Cardiology

## 2020-11-09 ENCOUNTER — Telehealth: Payer: Self-pay | Admitting: Cardiology

## 2020-11-09 NOTE — Telephone Encounter (Signed)
Spoke with Angi regarding appointment for Cardiac MRI scheduled Tuesday 12/07/20/ at 12:00 pm----arrival time 11:30 am 1st floor admissions office for check in.  WIll mail information to patient and it is also in My Chart.  Angie voiced her understanding.

## 2020-11-30 ENCOUNTER — Other Ambulatory Visit: Payer: Self-pay

## 2020-11-30 ENCOUNTER — Ambulatory Visit (HOSPITAL_COMMUNITY): Payer: Medicare HMO | Attending: Cardiology

## 2020-11-30 DIAGNOSIS — I10 Essential (primary) hypertension: Secondary | ICD-10-CM | POA: Insufficient documentation

## 2020-11-30 DIAGNOSIS — E782 Mixed hyperlipidemia: Secondary | ICD-10-CM | POA: Diagnosis not present

## 2020-11-30 DIAGNOSIS — I2510 Atherosclerotic heart disease of native coronary artery without angina pectoris: Secondary | ICD-10-CM | POA: Diagnosis not present

## 2020-11-30 DIAGNOSIS — R55 Syncope and collapse: Secondary | ICD-10-CM | POA: Diagnosis present

## 2020-11-30 DIAGNOSIS — Z720 Tobacco use: Secondary | ICD-10-CM | POA: Diagnosis not present

## 2020-11-30 LAB — ECHOCARDIOGRAM COMPLETE
Area-P 1/2: 1.36 cm2
S' Lateral: 6.1 cm

## 2020-11-30 MED ORDER — PERFLUTREN LIPID MICROSPHERE
1.0000 mL | INTRAVENOUS | Status: AC | PRN
  Administered 2020-11-30: 1 mL via INTRAVENOUS

## 2020-12-01 ENCOUNTER — Telehealth: Payer: Self-pay | Admitting: Cardiology

## 2020-12-01 NOTE — Telephone Encounter (Signed)
The patient's wife has been notified of the results. I advised her that Dr. Mayford Knife and Dr. Lalla Brothers would like to wait until we have the results from his cardiac MRI back so that we can have further information and they will come up with a plan after that. She verbalized understanding.

## 2020-12-01 NOTE — Telephone Encounter (Signed)
Patient's wife calling to request that someone call them to go over test results. Please call/advise.  Thank you!

## 2020-12-06 ENCOUNTER — Telehealth (HOSPITAL_COMMUNITY): Payer: Self-pay | Admitting: Emergency Medicine

## 2020-12-06 NOTE — Telephone Encounter (Signed)
Pt returning phone call regarding upcoming cardiac imaging study; pt verbalizes understanding of appt date/time, parking situation and where to check in, pre-test NPO status and medications ordered, and verified current allergies; name and call back number provided for further questions should they arise Rockwell Alexandria RN Navigator Cardiac Imaging [facility] [phone] office [phone] cell  Spoke with patients wife- she states he is not clasutro. Only has medical implanted metal like stents and plate in the cervical spine. Is hard of hearing [place]

## 2020-12-06 NOTE — Telephone Encounter (Signed)
Attempted to call patient regarding upcoming cardiac MR appointment. Left message on voicemail with name and callback number Tylea Hise RN Navigator Cardiac Imaging [facility] [phone] Office [phone] Cell  

## 2020-12-07 ENCOUNTER — Ambulatory Visit (HOSPITAL_COMMUNITY)
Admission: RE | Admit: 2020-12-07 | Discharge: 2020-12-07 | Disposition: A | Discharge status: Home / Self Care | Payer: Medicare HMO | Source: Ambulatory Visit | Attending: Cardiology | Admitting: Cardiology

## 2020-12-07 ENCOUNTER — Other Ambulatory Visit: Payer: Self-pay

## 2020-12-07 DIAGNOSIS — R55 Syncope and collapse: Secondary | ICD-10-CM

## 2020-12-07 DIAGNOSIS — I2510 Atherosclerotic heart disease of native coronary artery without angina pectoris: Secondary | ICD-10-CM

## 2020-12-07 MED ORDER — GADOBUTROL 1 MMOL/ML IV SOLN
13.0000 mL | Freq: Once | INTRAVENOUS | Status: AC | PRN
  Administered 2020-12-07: 13 mL via INTRAVENOUS

## 2020-12-15 ENCOUNTER — Telehealth: Payer: Self-pay

## 2020-12-15 NOTE — Telephone Encounter (Signed)
-----   Message from Lanier Prude, MD sent at 12/09/2020  7:32 AM EST ----- The MRI shows the pumping function of the heart is significantly decreased. There is scar tissue throughout the pumping chamber. Based on these findings, we should proceed with an ICD implant as we have previously discussed in clinic. I will see you in clinic in a couple weeks to answer any questions about the procedure.  Boneta Lucks, can you help schedule for a VVI ICD Conservation officer, historic buildings)?

## 2020-12-15 NOTE — Telephone Encounter (Signed)
Mychart message sent with available procedure dates.  Pt has appt to discuss ICD 1/4.

## 2020-12-21 ENCOUNTER — Encounter: Payer: Self-pay | Admitting: Cardiology

## 2020-12-21 ENCOUNTER — Ambulatory Visit: Payer: Medicare HMO | Admitting: Cardiology

## 2020-12-21 ENCOUNTER — Other Ambulatory Visit: Payer: Self-pay

## 2020-12-21 VITALS — BP 110/70 | HR 74 | Ht 70.0 in | Wt 253.0 lb

## 2020-12-21 DIAGNOSIS — Z01812 Encounter for preprocedural laboratory examination: Secondary | ICD-10-CM

## 2020-12-21 DIAGNOSIS — I429 Cardiomyopathy, unspecified: Secondary | ICD-10-CM

## 2020-12-21 DIAGNOSIS — I255 Ischemic cardiomyopathy: Secondary | ICD-10-CM | POA: Diagnosis not present

## 2020-12-21 DIAGNOSIS — I5022 Chronic systolic (congestive) heart failure: Secondary | ICD-10-CM | POA: Diagnosis not present

## 2020-12-21 DIAGNOSIS — I2510 Atherosclerotic heart disease of native coronary artery without angina pectoris: Secondary | ICD-10-CM

## 2020-12-21 LAB — BASIC METABOLIC PANEL
BUN/Creatinine Ratio: 10 (ref 9–20)
BUN: 12 mg/dL (ref 6–24)
CO2: 20 mmol/L (ref 20–29)
Calcium: 8.6 mg/dL — ABNORMAL LOW (ref 8.7–10.2)
Chloride: 102 mmol/L (ref 96–106)
Creatinine, Ser: 1.21 mg/dL (ref 0.76–1.27)
GFR calc Af Amer: 75 mL/min/{1.73_m2} (ref >59)
GFR calc non Af Amer: 65 mL/min/{1.73_m2} (ref >59)
Glucose: 121 mg/dL — ABNORMAL HIGH (ref 65–99)
Potassium: 3.9 mmol/L (ref 3.5–5.2)
Sodium: 139 mmol/L (ref 134–144)

## 2020-12-21 LAB — CBC
Hematocrit: 46.6 % (ref 37.5–51.0)
Hemoglobin: 15.8 g/dL (ref 13.0–17.7)
MCH: 30.6 pg (ref 26.6–33.0)
MCHC: 33.9 g/dL (ref 31.5–35.7)
MCV: 90 fL (ref 79–97)
Platelets: 145 10*3/uL — ABNORMAL LOW (ref 150–450)
RBC: 5.17 x10E6/uL (ref 4.14–5.80)
RDW: 13.3 % (ref 11.6–15.4)
WBC: 5.1 10*3/uL (ref 3.4–10.8)

## 2020-12-21 NOTE — H&P (View-Only) (Signed)
Electrophysiology Office Follow up Visit Note:    Date:  12/21/2020   ID:  Larry [name], DOB 12/03/1961, MRN [id number]  PCP:  Roberts Gaudy., MD  [facility] Cardiologist:  Armanda Magic, MD  [facility] Electrophysiologist:  Lanier Prude, MD    Interval History:    Larry [name] is a 60 y.o. male who presents for a follow up visit. They were last seen in clinic November 08, 2020 for syncope.  Based on his history, there were 2 distinct types of syncope described.  1 appeared to be related to orthostatic intolerance but the other was concerning for arrhythmogenic syncope with abrupt loss of consciousness without prodrome.  At that time he had had an echocardiogram which was nondiagnostic so a cardiac MRI was performed.  He presents today for follow-up of the cardiac MRI in anticipation of ICD implant.    Past Medical History:  Diagnosis Date  . Arthritis   . Nerve pain     Past Surgical History:  Procedure Laterality Date  . CORONARY STENT INTERVENTION N/A 10/05/2020   Procedure: CORONARY STENT INTERVENTION;  Surgeon: Corky Crafts, MD;  [facility] INVASIVE CV LAB;  Service: Cardiovascular;  Laterality: N/A;  LAD  . INTRAVASCULAR PRESSURE WIRE/FFR STUDY N/A 10/05/2020   Procedure: INTRAVASCULAR PRESSURE WIRE/FFR STUDY;  Surgeon: Corky Crafts, MD;  Location: [facility] INVASIVE CV LAB;  Service: Cardiovascular;  Laterality: N/A;  . INTRAVASCULAR ULTRASOUND/IVUS N/A 10/05/2020   Procedure: Intravascular Ultrasound/IVUS;  Surgeon: Corky Crafts, MD;  Location: [facility] INVASIVE CV LAB;  Service: Cardiovascular;  Laterality: N/A;  LAD  . LEFT HEART CATH AND CORONARY ANGIOGRAPHY N/A 10/05/2020   Procedure: LEFT HEART CATH AND CORONARY ANGIOGRAPHY;  Surgeon: Corky Crafts, MD;  Location: [facility] INVASIVE CV LAB;  Service: Cardiovascular;  Laterality: N/A;    Current Medications: Current Meds  Medication Sig  . albuterol (VENTOLIN HFA) 108 (90 Base) MCG/ACT  inhaler Inhale 2 puffs into the lungs every 6 (six) hours as needed for wheezing or shortness of breath.  Marland Kitchen aspirin EC 81 MG tablet Take 81 mg by mouth daily. Swallow whole.  Marland Kitchen atorvastatin (LIPITOR) 80 MG tablet Take 1 tablet (80 mg total) by mouth daily.  Marland Kitchen buPROPion (WELLBUTRIN XL) 150 MG 24 hr tablet Take 150 mg by mouth daily.  . clopidogrel (PLAVIX) 75 MG tablet Take 1 tablet (75 mg total) by mouth daily.  . Diroximel Fumarate (VUMERITY) 231 MG CPDR Take 462 mg by mouth in the morning and at bedtime.   . DULoxetine (CYMBALTA) 60 MG capsule Take 60 mg by mouth daily.  Marland Kitchen ezetimibe (ZETIA) 10 MG tablet Take 1 tablet (10 mg total) by mouth daily.  Marland Kitchen levothyroxine (SYNTHROID) 50 MCG tablet Take 50 mcg by mouth daily before breakfast.  . losartan (COZAAR) 25 MG tablet Take 0.5 tablets (12.5 mg total) by mouth daily.  . methocarbamol (ROBAXIN) 750 MG tablet Take 750 mg by mouth 3 (three) times daily.  . midodrine (PROAMATINE) 5 MG tablet Take 5 mg by mouth daily.  . Multiple Vitamin (MULTIVITAMIN WITH MINERALS) TABS tablet Take 1 tablet by mouth daily.  . nitroGLYCERIN (NITROSTAT) 0.4 MG SL tablet Place 1 tablet (0.4 mg total) under the tongue every 5 (five) minutes as needed for chest pain.  . pantoprazole (PROTONIX) 40 MG tablet Take 1 tablet (40 mg total) by mouth daily.  . pregabalin (LYRICA) 150 MG capsule Take 150 mg by mouth in the morning, at noon, and at  bedtime.  . zolpidem (AMBIEN) 10 MG tablet Take 10 mg by mouth at bedtime.     Allergies:   Aspirin   Social History   Socioeconomic History  . Marital status: Married    Spouse name: Not on file  . Number of children: Not on file  . Years of education: Not on file  . Highest education level: Not on file  Occupational History  . Not on file  Tobacco Use  . Smoking status: Current Every Day Smoker    Types: Cigarettes  . Smokeless tobacco: Never Used  Substance and Sexual Activity  . Alcohol use: No  . Drug use: No  .  Sexual activity: Not on file  Other Topics Concern  . Not on file  Social History Narrative  . Not on file   Social Determinants of Health   Financial Resource Strain: Not on file  Food Insecurity: Not on file  Transportation Needs: Not on file  Physical Activity: Not on file  Stress: Not on file  Social Connections: Not on file     Family History: The patient's family history includes Heart attack (age of onset: 83) in his mother. There is no history of Lung cancer.  ROS:   Please see the history of present illness.    All other systems reviewed and are negative.  EKGs/Labs/Other Studies Reviewed:    The following studies were reviewed today: Prior notes, cardiac MRI  12/07/2020 Cardiac MRI 1. Severely dilated left ventricle with normal wall thickness and severely decreased systolic function (LVEF = 30%). There is diffuse hypokinesis. There is almost transmural (75-100%) late gadolinium enhancement in the following segments: (Basal anteroseptal, inferoseptal, inferior, inferolateral, mid inferoseptal, inferior and apical inferior walls). LVEDD: 70 mm LVESD: 58 mm LVEDV: 224  ml LVESV: 156 ml SV: 67 ml CO: 4.3 L/min Myocardial mass: 120 g 2. Normal right ventricular size, thickness and systolic function (RVEF = 47%). There are no regional wall motion abnormalities. 3.  Normal left and right atrial size. 4. Normal size of the aortic root, ascending aorta (maximum diameter 34 mm) and pulmonary artery. 5.  Mild mitral and tricuspid regurgitation. 6.  Normal pericardium.  No pericardial effusion.  EKG:  The ekg ordered today demonstrates normal sinus rhythm.  QRS duration 98 ms.  Recent Labs: 09/12/2020: B Natriuretic Peptide 370.0 09/13/2020: Magnesium 2.4 10/19/2020: BUN 8; Creatinine, Ser 1.24; Hemoglobin 16.5; Platelets 149; Potassium 4.2; Sodium 136 11/02/2020: ALT 26  Recent Lipid Panel    Component Value Date/Time   CHOL 132 11/02/2020 0947   TRIG 124  11/02/2020 0947   HDL 31 (L) 11/02/2020 0947   CHOLHDL 4.3 11/02/2020 0947   LDLCALC 78 11/02/2020 0947    Physical Exam:    VS:  Ht 5' 10" (1.778 m)   Wt 253 lb (114.8 kg)   BMI 36.30 kg/m     Wt Readings from Last 3 Encounters:  12/21/20 253 lb (114.8 kg)  11/08/20 253 lb 9.6 oz (115 kg)  10/29/20 253 lb 12.8 oz (115.1 kg)     GEN:  Well nourished, well developed in no acute distress HEENT: Normal NECK: No JVD; No carotid bruits LYMPHATICS: No lymphadenopathy CARDIAC: RRR, no murmurs, rubs, gallops RESPIRATORY:  Clear to auscultation without rales, wheezing or rhonchi  ABDOMEN: Soft, non-tender, non-distended MUSCULOSKELETAL:  No edema; No deformity  SKIN: Warm and dry NEUROLOGIC:  Alert and oriented x 3 PSYCHIATRIC:  Normal affect   ASSESSMENT:    1. Chronic systolic heart   failure (HCC)   2. Ischemic cardiomyopathy   3. Coronary artery disease without angina pectoris, unspecified vessel or lesion type, unspecified whether native or transplanted heart   4. Cardiomyopathy, unspecified type (HCC)    PLAN:    In order of problems listed above:  1. Chronic systolic heart failure secondary to ischemic cardiomyopathy NYHA class 2-3 symptoms.  Warm and dry on exam.  EF 30% on recent cardiac MRI. Continue aspirin and Plavix.  We will need to continue these uninterrupted given the recent PCI. Continue losartan, atorvastatin.  Given the large scar burden on cardiac MRI and severely decreased left ventricular function, discussed primary prevention of sudden cardiac death with ICD.  Risks and expected recovery time were discussed with the patient during today's visit and he wishes to proceed.  The patient has an ischemic CM (EF 30%), NYHA Class II-III CHF, and CAD.  He is referred by Dr Mayford Knife for risk stratification of sudden death and consideration of ICD implantation.  At this time, he meets criteria for ICD implantation for primary prevention of sudden death.  I have had a  thorough discussion with the patient reviewing options.  The patient and their family (if available) have had opportunities to ask questions and have them answered. The patient and I have decided together through a shared decision making process to implant ICD at this time.   Risks, benefits, alternatives to ICD implantation were discussed in detail with the patient today. The patient understands that the risks include but are not limited to bleeding, infection, pneumothorax, perforation, tamponade, vascular damage, renal failure, MI, stroke, death, inappropriate shocks, and lead dislodgement and wishes to proceed.  We will therefore schedule device implantation at the next available time.  2.  Coronary artery disease No ischemic symptoms during today's exam. Continue aspirin and Plavix uninterrupted around the time of his ICD implant given proximity to recent PCI.    Medication Adjustments/Labs and Tests Ordered: Current medicines are reviewed at length with the patient today.  Concerns regarding medicines are outlined above.  Orders Placed This Encounter  Procedures  . EKG 12-Lead   No orders of the defined types were placed in this encounter.    Signed, Steffanie Dunn, MD, [facility]  12/21/2020 8:49 AM    Electrophysiology [facility]

## 2020-12-21 NOTE — Progress Notes (Signed)
Electrophysiology Office Follow up Visit Note:    Date:  12/21/2020   ID:  Jonus Coble Andy, DOB 12/03/1961, MRN [id number]  PCP:  Roberts Gaudy., MD  [facility] Cardiologist:  Armanda Magic, MD  [facility] Electrophysiologist:  Lanier Prude, MD    Interval History:    Nyree Yonker Zenor is a 60 y.o. male who presents for a follow up visit. They were last seen in clinic November 08, 2020 for syncope.  Based on his history, there were 2 distinct types of syncope described.  1 appeared to be related to orthostatic intolerance but the other was concerning for arrhythmogenic syncope with abrupt loss of consciousness without prodrome.  At that time he had had an echocardiogram which was nondiagnostic so a cardiac MRI was performed.  He presents today for follow-up of the cardiac MRI in anticipation of ICD implant.    Past Medical History:  Diagnosis Date  . Arthritis   . Nerve pain     Past Surgical History:  Procedure Laterality Date  . CORONARY STENT INTERVENTION N/A 10/05/2020   Procedure: CORONARY STENT INTERVENTION;  Surgeon: Corky Crafts, MD;  [facility]VASIVE CV LAB;  Service: Cardiovascular;  Laterality: N/A;  LAD  . INTRAVASCULAR PRESSURE WIRE/FFR STUDY N/A 10/05/2020   Procedure: INTRAVASCULAR PRESSURE WIRE/FFR STUDY;  Surgeon: Corky Crafts, MD;  Location: [facility] INVASIVE CV LAB;  Service: Cardiovascular;  Laterality: N/A;  . INTRAVASCULAR ULTRASOUND/IVUS N/A 10/05/2020   Procedure: Intravascular Ultrasound/IVUS;  Surgeon: Corky Crafts, MD;  Location: [facility] INVASIVE CV LAB;  Service: Cardiovascular;  Laterality: N/A;  LAD  . LEFT HEART CATH AND CORONARY ANGIOGRAPHY N/A 10/05/2020   Procedure: LEFT HEART CATH AND CORONARY ANGIOGRAPHY;  Surgeon: Corky Crafts, MD;  Location: [facility] INVASIVE CV LAB;  Service: Cardiovascular;  Laterality: N/A;    Current Medications: Current Meds  Medication Sig  . albuterol (VENTOLIN HFA) 108 (90 Base) MCG/ACT  inhaler Inhale 2 puffs into the lungs every 6 (six) hours as needed for wheezing or shortness of breath.  Marland Kitchen aspirin EC 81 MG tablet Take 81 mg by mouth daily. Swallow whole.  Marland Kitchen atorvastatin (LIPITOR) 80 MG tablet Take 1 tablet (80 mg total) by mouth daily.  Marland Kitchen buPROPion (WELLBUTRIN XL) 150 MG 24 hr tablet Take 150 mg by mouth daily.  . clopidogrel (PLAVIX) 75 MG tablet Take 1 tablet (75 mg total) by mouth daily.  . Diroximel Fumarate (VUMERITY) 231 MG CPDR Take 462 mg by mouth in the morning and at bedtime.   . DULoxetine (CYMBALTA) 60 MG capsule Take 60 mg by mouth daily.  Marland Kitchen ezetimibe (ZETIA) 10 MG tablet Take 1 tablet (10 mg total) by mouth daily.  Marland Kitchen levothyroxine (SYNTHROID) 50 MCG tablet Take 50 mcg by mouth daily before breakfast.  . losartan (COZAAR) 25 MG tablet Take 0.5 tablets (12.5 mg total) by mouth daily.  . methocarbamol (ROBAXIN) 750 MG tablet Take 750 mg by mouth 3 (three) times daily.  . midodrine (PROAMATINE) 5 MG tablet Take 5 mg by mouth daily.  . Multiple Vitamin (MULTIVITAMIN WITH MINERALS) TABS tablet Take 1 tablet by mouth daily.  . nitroGLYCERIN (NITROSTAT) 0.4 MG SL tablet Place 1 tablet (0.4 mg total) under the tongue every 5 (five) minutes as needed for chest pain.  . pantoprazole (PROTONIX) 40 MG tablet Take 1 tablet (40 mg total) by mouth daily.  . pregabalin (LYRICA) 150 MG capsule Take 150 mg by mouth in the morning, at noon, and at  bedtime.  Marland Kitchen zolpidem (AMBIEN) 10 MG tablet Take 10 mg by mouth at bedtime.     Allergies:   Aspirin   Social History   Socioeconomic History  . Marital status: Married    Spouse name: Not on file  . Number of children: Not on file  . Years of education: Not on file  . Highest education level: Not on file  Occupational History  . Not on file  Tobacco Use  . Smoking status: Current Every Day Smoker    Types: Cigarettes  . Smokeless tobacco: Never Used  Substance and Sexual Activity  . Alcohol use: No  . Drug use: No  .  Sexual activity: Not on file  Other Topics Concern  . Not on file  Social History Narrative  . Not on file   Social Determinants of Health   Financial Resource Strain: Not on file  Food Insecurity: Not on file  Transportation Needs: Not on file  Physical Activity: Not on file  Stress: Not on file  Social Connections: Not on file     Family History: The patient's family history includes Heart attack (age of onset: 31) in his mother. There is no history of Lung cancer.  ROS:   Please see the history of present illness.    All other systems reviewed and are negative.  EKGs/Labs/Other Studies Reviewed:    The following studies were reviewed today: Prior notes, cardiac MRI  12/07/2020 Cardiac MRI 1. Severely dilated left ventricle with normal wall thickness and severely decreased systolic function (LVEF = 30%). There is diffuse hypokinesis. There is almost transmural (75-100%) late gadolinium enhancement in the following segments: (Basal anteroseptal, inferoseptal, inferior, inferolateral, mid inferoseptal, inferior and apical inferior walls). LVEDD: 70 mm LVESD: 58 mm LVEDV: 224  ml LVESV: 156 ml SV: 67 ml CO: 4.3 L/min Myocardial mass: 120 g 2. Normal right ventricular size, thickness and systolic function (RVEF = 47%). There are no regional wall motion abnormalities. 3.  Normal left and right atrial size. 4. Normal size of the aortic root, ascending aorta (maximum diameter 34 mm) and pulmonary artery. 5.  Mild mitral and tricuspid regurgitation. 6.  Normal pericardium.  No pericardial effusion.  EKG:  The ekg ordered today demonstrates normal sinus rhythm.  QRS duration 98 ms.  Recent Labs: 09/12/2020: B Natriuretic Peptide 370.0 09/13/2020: Magnesium 2.4 10/19/2020: BUN 8; Creatinine, Ser 1.24; Hemoglobin 16.5; Platelets 149; Potassium 4.2; Sodium 136 11/02/2020: ALT 26  Recent Lipid Panel    Component Value Date/Time   CHOL 132 11/02/2020 0947   TRIG 124  11/02/2020 0947   HDL 31 (L) 11/02/2020 0947   CHOLHDL 4.3 11/02/2020 0947   LDLCALC 78 11/02/2020 0947    Physical Exam:    VS:  Ht 5\' 10"  (1.778 m)   Wt 253 lb (114.8 kg)   BMI 36.30 kg/m     Wt Readings from Last 3 Encounters:  12/21/20 253 lb (114.8 kg)  11/08/20 253 lb 9.6 oz (115 kg)  10/29/20 253 lb 12.8 oz (115.1 kg)     GEN:  Well nourished, well developed in no acute distress HEENT: Normal NECK: No JVD; No carotid bruits LYMPHATICS: No lymphadenopathy CARDIAC: RRR, no murmurs, rubs, gallops RESPIRATORY:  Clear to auscultation without rales, wheezing or rhonchi  ABDOMEN: Soft, non-tender, non-distended MUSCULOSKELETAL:  No edema; No deformity  SKIN: Warm and dry NEUROLOGIC:  Alert and oriented x 3 PSYCHIATRIC:  Normal affect   ASSESSMENT:    1. Chronic systolic heart  failure (HCC)   2. Ischemic cardiomyopathy   3. Coronary artery disease without angina pectoris, unspecified vessel or lesion type, unspecified whether native or transplanted heart   4. Cardiomyopathy, unspecified type (HCC)    PLAN:    In order of problems listed above:  1. Chronic systolic heart failure secondary to ischemic cardiomyopathy NYHA class 2-3 symptoms.  Warm and dry on exam.  EF 30% on recent cardiac MRI. Continue aspirin and Plavix.  We will need to continue these uninterrupted given the recent PCI. Continue losartan, atorvastatin.  Given the large scar burden on cardiac MRI and severely decreased left ventricular function, discussed primary prevention of sudden cardiac death with ICD.  Risks and expected recovery time were discussed with the patient during today's visit and he wishes to proceed.  The patient has an ischemic CM (EF 30%), NYHA Class II-III CHF, and CAD.  He is referred by Dr Mayford Knife for risk stratification of sudden death and consideration of ICD implantation.  At this time, he meets criteria for ICD implantation for primary prevention of sudden death.  I have had a  thorough discussion with the patient reviewing options.  The patient and their family (if available) have had opportunities to ask questions and have them answered. The patient and I have decided together through a shared decision making process to implant ICD at this time.   Risks, benefits, alternatives to ICD implantation were discussed in detail with the patient today. The patient understands that the risks include but are not limited to bleeding, infection, pneumothorax, perforation, tamponade, vascular damage, renal failure, MI, stroke, death, inappropriate shocks, and lead dislodgement and wishes to proceed.  We will therefore schedule device implantation at the next available time.  2.  Coronary artery disease No ischemic symptoms during today's exam. Continue aspirin and Plavix uninterrupted around the time of his ICD implant given proximity to recent PCI.    Medication Adjustments/Labs and Tests Ordered: Current medicines are reviewed at length with the patient today.  Concerns regarding medicines are outlined above.  Orders Placed This Encounter  Procedures  . EKG 12-Lead   No orders of the defined types were placed in this encounter.    Signed, Steffanie Dunn, MD, [facility]  12/21/2020 8:49 AM    Electrophysiology [facility]

## 2020-12-21 NOTE — Patient Instructions (Signed)
Medication Instructions:  Your physician recommends that you continue on your current medications as directed. Please refer to the Current Medication list given to you today.  *If you need a refill on your cardiac medications before your next appointment, please call your pharmacy*   Lab Work: CBC and BMET today  If you have labs (blood work) drawn today and your tests are completely normal, you will receive your results only by: . MyChart Message (if you have MyChart) OR . A paper copy in the mail If you have any lab test that is abnormal or we need to change your treatment, we will call you to review the results.   Testing/Procedures: Your physician has recommended that you have a defibrillator inserted. An implantable cardioverter defibrillator (ICD) is a small device that is placed in your chest or, in rare cases, your abdomen. This device uses electrical pulses or shocks to help control life-threatening, irregular heartbeats that could lead the heart to suddenly stop beating (sudden cardiac arrest). Leads are attached to the ICD that goes into your heart. This is done in the hospital and usually requires an overnight stay. Please see the instruction sheet given to you today for more information.     Follow-Up: At [facility], you and your health needs are our priority.  As part of our continuing mission to provide you with exceptional heart care, we have created designated Provider Care Teams.  These Care Teams include your primary Cardiologist (physician) and Advanced Practice Providers (APPs -  Physician Assistants and Nurse Practitioners) who all work together to provide you with the care you need, when you need it.  We recommend signing up for the patient portal called "MyChart".  Sign up information is provided on this After Visit Summary.  MyChart is used to connect with patients for Virtual Visits (Telemedicine).  Patients are able to view lab/test results, encounter notes,  upcoming appointments, etc.  Non-urgent messages can be sent to your provider as well.   To learn more about what you can do with MyChart, go to [email or web address].    Your next appointment:   To be scheduled   

## 2021-01-03 ENCOUNTER — Other Ambulatory Visit: Payer: Medicare HMO

## 2021-01-05 ENCOUNTER — Other Ambulatory Visit: Payer: Medicare HMO

## 2021-01-12 ENCOUNTER — Other Ambulatory Visit (HOSPITAL_COMMUNITY)
Admission: RE | Admit: 2021-01-12 | Discharge: 2021-01-12 | Disposition: A | Discharge status: Home / Self Care | Payer: Medicare HMO | Source: Ambulatory Visit | Attending: Cardiology | Admitting: Cardiology

## 2021-01-12 ENCOUNTER — Other Ambulatory Visit: Payer: Self-pay

## 2021-01-12 ENCOUNTER — Other Ambulatory Visit: Payer: Medicare HMO | Admitting: *Deleted

## 2021-01-12 DIAGNOSIS — Z01812 Encounter for preprocedural laboratory examination: Secondary | ICD-10-CM | POA: Insufficient documentation

## 2021-01-12 DIAGNOSIS — Z20822 Contact with and (suspected) exposure to covid-19: Secondary | ICD-10-CM | POA: Diagnosis not present

## 2021-01-12 DIAGNOSIS — E782 Mixed hyperlipidemia: Secondary | ICD-10-CM

## 2021-01-12 LAB — SARS CORONAVIRUS 2 (TAT 6-24 HRS): SARS Coronavirus 2: NEGATIVE

## 2021-01-13 LAB — LIPID PANEL
Chol/HDL Ratio: 2.9 ratio (ref 0.0–5.0)
Cholesterol, Total: 92 mg/dL — ABNORMAL LOW (ref 100–199)
HDL: 32 mg/dL — ABNORMAL LOW (ref >39)
LDL Chol Calc (NIH): 44 mg/dL (ref 0–99)
Triglycerides: 73 mg/dL (ref 0–149)
VLDL Cholesterol Cal: 16 mg/dL (ref 5–40)

## 2021-01-13 LAB — ALT: ALT: 21 IU/L (ref 0–44)

## 2021-01-13 NOTE — Progress Notes (Signed)
Instructed patient on the following items: Arrival time 1030  Nothing to eat or drink after midnight No meds AM of procedure Responsible person to drive you home and stay with you for 24 hrs Wash with special soap night before and morning of procedure 

## 2021-01-14 ENCOUNTER — Ambulatory Visit (HOSPITAL_COMMUNITY): Payer: Medicare HMO

## 2021-01-14 ENCOUNTER — Ambulatory Visit (HOSPITAL_COMMUNITY)
Admission: RE | Disposition: A | Discharge status: Home / Self Care | Payer: Medicare HMO | Source: Home / Self Care | Attending: Cardiology

## 2021-01-14 ENCOUNTER — Other Ambulatory Visit: Payer: Self-pay

## 2021-01-14 ENCOUNTER — Ambulatory Visit (HOSPITAL_COMMUNITY)
Admission: RE | Admit: 2021-01-14 | Discharge: 2021-01-14 | Disposition: A | Discharge status: Home / Self Care | Payer: Medicare HMO | Attending: Cardiology | Admitting: Cardiology

## 2021-01-14 DIAGNOSIS — Z955 Presence of coronary angioplasty implant and graft: Secondary | ICD-10-CM | POA: Diagnosis not present

## 2021-01-14 DIAGNOSIS — Z7982 Long term (current) use of aspirin: Secondary | ICD-10-CM | POA: Diagnosis not present

## 2021-01-14 DIAGNOSIS — I2510 Atherosclerotic heart disease of native coronary artery without angina pectoris: Secondary | ICD-10-CM | POA: Insufficient documentation

## 2021-01-14 DIAGNOSIS — Z79899 Other long term (current) drug therapy: Secondary | ICD-10-CM | POA: Insufficient documentation

## 2021-01-14 DIAGNOSIS — Z79890 Hormone replacement therapy (postmenopausal): Secondary | ICD-10-CM | POA: Insufficient documentation

## 2021-01-14 DIAGNOSIS — F17210 Nicotine dependence, cigarettes, uncomplicated: Secondary | ICD-10-CM | POA: Insufficient documentation

## 2021-01-14 DIAGNOSIS — Z95810 Presence of automatic (implantable) cardiac defibrillator: Secondary | ICD-10-CM

## 2021-01-14 DIAGNOSIS — Z8249 Family history of ischemic heart disease and other diseases of the circulatory system: Secondary | ICD-10-CM | POA: Diagnosis not present

## 2021-01-14 DIAGNOSIS — I5022 Chronic systolic (congestive) heart failure: Secondary | ICD-10-CM

## 2021-01-14 DIAGNOSIS — I255 Ischemic cardiomyopathy: Secondary | ICD-10-CM

## 2021-01-14 DIAGNOSIS — Z886 Allergy status to analgesic agent status: Secondary | ICD-10-CM | POA: Insufficient documentation

## 2021-01-14 HISTORY — PX: ICD IMPLANT: EP1208

## 2021-01-14 SURGERY — ICD IMPLANT

## 2021-01-14 MED ORDER — FENTANYL CITRATE (PF) 100 MCG/2ML IJ SOLN
INTRAMUSCULAR | Status: AC
  Filled 2021-01-14: qty 2

## 2021-01-14 MED ORDER — CHLORHEXIDINE GLUCONATE 4 % EX LIQD: 4.0000 "application " | Freq: Once | CUTANEOUS | Status: DC

## 2021-01-14 MED ORDER — HEPARIN (PORCINE) IN NACL 1000-0.9 UT/500ML-% IV SOLN
INTRAVENOUS | Status: DC | PRN
  Administered 2021-01-14: 500 mL

## 2021-01-14 MED ORDER — MIDAZOLAM HCL 5 MG/5ML IJ SOLN
INTRAMUSCULAR | Status: DC | PRN
  Administered 2021-01-14 (×2): 1 mg via INTRAVENOUS

## 2021-01-14 MED ORDER — ONDANSETRON HCL 4 MG/2ML IJ SOLN: 4.0000 mg | Freq: Four times a day (QID) | INTRAMUSCULAR | Status: DC | PRN

## 2021-01-14 MED ORDER — LIDOCAINE HCL 1 % IJ SOLN
INTRAMUSCULAR | Status: AC
  Filled 2021-01-14: qty 60

## 2021-01-14 MED ORDER — SODIUM CHLORIDE 0.9 % IV SOLN
INTRAVENOUS | Status: AC
  Filled 2021-01-14: qty 2

## 2021-01-14 MED ORDER — POVIDONE-IODINE 10 % EX SWAB: 2.0000 "application " | Freq: Once | CUTANEOUS | Status: DC

## 2021-01-14 MED ORDER — SODIUM CHLORIDE 0.9 % IV SOLN
80.0000 mg | INTRAVENOUS | Status: AC
  Administered 2021-01-14: 80 mg

## 2021-01-14 MED ORDER — SODIUM CHLORIDE 0.9 % IV SOLN: INTRAVENOUS | Status: DC

## 2021-01-14 MED ORDER — MIDAZOLAM HCL 5 MG/5ML IJ SOLN
INTRAMUSCULAR | Status: AC
  Filled 2021-01-14: qty 5

## 2021-01-14 MED ORDER — HEPARIN (PORCINE) IN NACL 1000-0.9 UT/500ML-% IV SOLN
INTRAVENOUS | Status: AC
  Filled 2021-01-14: qty 500

## 2021-01-14 MED ORDER — ACETAMINOPHEN 325 MG PO TABS
325.0000 mg | ORAL_TABLET | ORAL | Status: DC | PRN
Start: 2021-01-14 — End: 2021-01-15
  Administered 2021-01-14: 650 mg via ORAL
  Filled 2021-01-14 (×3): qty 2

## 2021-01-14 MED ORDER — LIDOCAINE HCL (PF) 1 % IJ SOLN
INTRAMUSCULAR | Status: DC | PRN
  Administered 2021-01-14: 60 mL

## 2021-01-14 MED ORDER — CEFAZOLIN SODIUM-DEXTROSE 2-4 GM/100ML-% IV SOLN
INTRAVENOUS | Status: AC
  Filled 2021-01-14: qty 100

## 2021-01-14 MED ORDER — FENTANYL CITRATE (PF) 100 MCG/2ML IJ SOLN
INTRAMUSCULAR | Status: DC | PRN
  Administered 2021-01-14 (×2): 25 ug via INTRAVENOUS

## 2021-01-14 MED ORDER — CEFAZOLIN SODIUM-DEXTROSE 2-4 GM/100ML-% IV SOLN
2.0000 g | INTRAVENOUS | Status: AC
  Administered 2021-01-14: 2 g via INTRAVENOUS

## 2021-01-14 SURGICAL SUPPLY — 6 items
CABLE SURGICAL S-101-97-12 (CABLE) ×2 IMPLANT
ICD VIGILANT VR D232 (Pacemaker) ×1 IMPLANT
LEAD RELIANCE 0673 ×1 IMPLANT
PAD PRO RADIOLUCENT 2001M-C (PAD) ×2 IMPLANT
SHEATH 9FR PRELUDE SNAP 13 (SHEATH) ×1 IMPLANT
TRAY PACEMAKER INSERTION (PACKS) ×2 IMPLANT

## 2021-01-14 NOTE — Interval H&P Note (Signed)
History and Physical Interval Note:  01/14/2021 10:49 AM  Larry [name]  has presented today for surgery, with the diagnosis of cardiomyopathy.  The various methods of treatment have been discussed with the patient and family. After consideration of risks, benefits and other options for treatment, the patient has consented to  Procedure(s): ICD IMPLANT (N/A) as a surgical intervention.  The patient's history has been reviewed, patient examined, no change in status, stable for surgery.  I have reviewed the patient's chart and labs.  Questions were answered to the patient's satisfaction.     Genesia Caslin T Camaya [employer]

## 2021-01-14 NOTE — Discharge Instructions (Signed)
After Your ICD (Implantable Cardiac Defibrillator)    You have a [employer] ICD  Remove arm sling in 24 hours  Remove  Dressing/ bandage in 24 hours  Keep site dry till wound check 2/8  Sponge bathe till wound check   ACTIVITY  Do not lift your arm above shoulder height for 1 week after your procedure. After 7 days, you may progress as below.   You should remove your sling 24 hours after your procedure, unless otherwise instructed by your provider.     Friday January 21, 2021  Saturday January 22, 2021 Sunday January 23, 2021 Monday January 24, 2021    Do not lift, push, pull, or carry anything over 10 pounds with the affected arm until 6 weeks (Friday February 25, 2021 ) after your procedure.    Do NOT DRIVE until you have been seen for your wound check, or as long as instructed by your healthcare provider.       INCISION/Dressing  If you are on a blood thinner such as Plavix,  resume 1/29   Monitor your defibrillator site for redness, swelling, and drainage. Call the device clinic at [phone] if you experience these symptoms or fever/chills.   If your incision is sealed with Steri-strips or staples, you may shower 10 days after your procedure or when told by your provider. Do not remove the steri-strips or let the shower hit directly on your site. You may wash around your site with soap and water.     Avoid lotions, ointments, or perfumes over your incision until it is well-healed.   You may use a hot tub or a pool AFTER your wound check appointment if the incision is completely closed.   Your ICD is designed to protect you from life threatening heart rhythms. Because of this, you may receive a shock.   o 1 shock with no symptoms:  Call the office during business hours. o 1 shock with symptoms (chest pain, chest pressure, dizziness, lightheadedness, shortness of breath, overall feeling unwell):  Call 911. o If you experience 2 or more shocks in 24  hours:  Call 911. o If you receive a shock, you should not drive for 6 months per the [place] DMV IF you receive appropriate therapy from your ICD.    ICD Alerts:  Some alerts are vibratory and others beep. These are NOT emergencies. Please call our office to let us know. If this occurs at night or on weekends, it can wait until the next business day. Send a remote transmission.   If your device is capable of reading fluid status (for heart failure), you will be offered monthly monitoring to review this with you.   DEVICE MANAGEMENT  Remote monitoring is used to monitor your ICD from home. This monitoring is scheduled every 91 days by our office. It allows [place] to keep an eye on the functioning of your device to ensure it is working properly. You will routinely see your Electrophysiologist annually (more often if necessary).    You should receive your ID card for your new device in 4-8 weeks. Keep this card with you at all times once received. Consider wearing a medical alert bracelet or necklace.   Your ICD  may be MRI compatible. This will be discussed at your next office visit/wound check.  You should avoid contact with strong electric or magnetic fields.    Do not use amateur (ham) radio equipment or electric (arc) welding torches. MP3 player headphones with magnets  should not be used. Some devices are safe to use if held at least 12 inches (30 cm) from your defibrillator. These include power tools, lawn mowers, and speakers. If you are unsure if something is safe to use, ask your health care provider.   When using your cell phone, hold it to the ear that is on the opposite side from the defibrillator. Do not leave your cell phone in a pocket over the defibrillator.   You may safely use electric blankets, heating pads, computers, and microwave ovens.  Call the office right away if:  You have chest pain.  You feel more than one shock.  You feel more short of breath than you have felt  before.  You feel more light-headed than you have felt before.  Your incision starts to open up.  This information is not intended to replace advice given to you by your health care provider. Make sure you discuss any questions you have with your health care provider.

## 2021-01-17 ENCOUNTER — Encounter (HOSPITAL_COMMUNITY): Payer: Self-pay | Admitting: Cardiology

## 2021-01-17 ENCOUNTER — Telehealth: Payer: Self-pay | Admitting: Emergency Medicine

## 2021-01-17 MED FILL — Lidocaine HCl Local Preservative Free (PF) Inj 1%: INTRAMUSCULAR | Qty: 60 | Status: AC

## 2021-01-17 NOTE — Telephone Encounter (Signed)
Patient unavailable . Discussed with wife per DPR wound care instructions and lifting restrictions. Questions answered and wound check appointment verified for 01/25/21.

## 2021-01-25 ENCOUNTER — Ambulatory Visit (INDEPENDENT_AMBULATORY_CARE_PROVIDER_SITE_OTHER): Payer: Medicare HMO | Admitting: Emergency Medicine

## 2021-01-25 ENCOUNTER — Other Ambulatory Visit: Payer: Self-pay

## 2021-01-25 DIAGNOSIS — I429 Cardiomyopathy, unspecified: Secondary | ICD-10-CM

## 2021-01-25 DIAGNOSIS — I428 Other cardiomyopathies: Secondary | ICD-10-CM

## 2021-01-25 NOTE — Progress Notes (Signed)
Wound check appointment. Steri-strips removed. Wound without redness or edema. Incision edges approximated, wound well healed. Normal device function. ICD check in clinic. Normal device function. Thresholds and sensing consistent with previous device measurements. Impedance trends stable over time. No ventricular arrhythmias. Histogram distribution appropriate for patient and level of activity. No changes made this session. Device programmed at appropriate safety margins. Device programmed to optimize intrinsic conduction. Pt enrolled in remote follow-up 04/26/21. Is currently waiting on a box from [employer]. Rep is aware and will get box to patient when it arrives.  Patient education completed including shock plan.

## 2021-01-26 LAB — CUP PACEART INCLINIC DEVICE CHECK
Brady Statistic RV Percent Paced: 0 %
Date Time Interrogation Session: 20220208000000
HighPow Impedance: 89 Ohm
Implantable Lead Implant Date: 20220128
Implantable Lead Location: 753860
Implantable Lead Model: 673
Implantable Lead Serial Number: 151771
Implantable Pulse Generator Implant Date: 20220128
Lead Channel Impedance Value: 471 Ohm
Lead Channel Pacing Threshold Amplitude: 0.9 V
Lead Channel Pacing Threshold Pulse Width: 0.4 ms
Lead Channel Sensing Intrinsic Amplitude: 13.4 mV
Lead Channel Setting Pacing Amplitude: 3.5 V
Lead Channel Setting Pacing Pulse Width: 0.4 ms
Lead Channel Setting Sensing Sensitivity: 0.5 mV
Pulse Gen Serial Number: 289752

## 2021-03-01 ENCOUNTER — Telehealth: Payer: Self-pay | Admitting: Emergency Medicine

## 2021-03-01 NOTE — Telephone Encounter (Signed)
Received myChart message from patient's wife Angie(DPR) that patient has been having CP and exhaustion. Patient called and reports he has had extreme fatigue to the point he can barely walk to the bathroom. He reports SOB with activity, substernal non-radiating chest pressure , with intermittent CP since 02/28/21. No diaphoresis or nausea. No home remote monitor to send transmission. Reports no syncope , + intermittent dizziness. Recommended patient go to ED for evaluation of CP. Patient reports he does not want to go to the hospital. Angie was listening to recommendations by speaker phone. Stressed importance of cardiac evaluation due to cardiac hx,and  symptoms. He will consider ED visit but unwilling to go at this time. Ed precautions given. Angie will send myChart message if patient decides to go to ED.  Dr Lalla Brothers made aware of ED recommendations and patient s/sx.. Dr Lalla Brothers agrees with ED recommendation for assessment.

## 2021-03-03 NOTE — Telephone Encounter (Signed)
Called and spoke with the patient and his wife this afternoon.  The patient tells me that he feels well today without chest pain.  He said the other day he was having some sharp chest pain that has now completely resolved.  It is resolved without any specific intervention.  He was appreciative of the phone call and will continue with routine follow-up.  Sheria Lang T. Lalla Brothers, MD, [facility] Cardiac Electrophysiology

## 2021-03-03 NOTE — Telephone Encounter (Signed)
Spoke with wife Angie(DPR) , she reports patient has continued to have decreased energy and does not feel well. Patient is not at home at this time. Will schedule with EP AP or Dr Lalla Brothers for evaluation. ED precautions given for CP or chest pressure or worsening cardiac condition.Will forward to Dr Mayford Knife.

## 2021-04-19 ENCOUNTER — Encounter: Payer: Medicare HMO | Admitting: Cardiology

## 2021-04-21 ENCOUNTER — Telehealth: Payer: Self-pay | Admitting: Cardiology

## 2021-04-21 NOTE — Telephone Encounter (Signed)
Patient's wife called to say they havent receive the machine yet. Please advise

## 2021-04-21 NOTE — Telephone Encounter (Signed)
Attempted to return phone call, no answer.  LVM advising monitors are still on backorder, I ill follow-up with representative to make sure that patient is still on list to receive a monitor.

## 2021-04-26 ENCOUNTER — Ambulatory Visit: Payer: Medicare HMO

## 2021-05-18 ENCOUNTER — Ambulatory Visit (INDEPENDENT_AMBULATORY_CARE_PROVIDER_SITE_OTHER): Payer: Medicare HMO

## 2021-05-18 DIAGNOSIS — I429 Cardiomyopathy, unspecified: Secondary | ICD-10-CM | POA: Diagnosis not present

## 2021-05-18 LAB — CUP PACEART REMOTE DEVICE CHECK
Battery Remaining Longevity: 180 mo
Battery Remaining Percentage: 100 %
Brady Statistic RV Percent Paced: 0 %
Date Time Interrogation Session: 20220601001900
HighPow Impedance: 90 Ohm
Implantable Lead Implant Date: 20220128
Implantable Lead Location: 753860
Implantable Lead Model: 673
Implantable Lead Serial Number: 151771
Implantable Pulse Generator Implant Date: 20220128
Lead Channel Impedance Value: 403 Ohm
Lead Channel Setting Pacing Amplitude: 3.5 V
Lead Channel Setting Pacing Pulse Width: 0.4 ms
Lead Channel Setting Sensing Sensitivity: 0.5 mV
Pulse Gen Serial Number: 289752

## 2021-06-10 NOTE — Progress Notes (Signed)
Remote ICD transmission.   

## 2021-07-01 ENCOUNTER — Ambulatory Visit: Payer: Medicare HMO | Admitting: Cardiology

## 2021-07-01 ENCOUNTER — Encounter: Payer: Self-pay | Admitting: Cardiology

## 2021-07-01 ENCOUNTER — Other Ambulatory Visit: Payer: Self-pay

## 2021-07-01 VITALS — BP 112/82 | HR 71 | Ht 70.0 in | Wt 262.2 lb

## 2021-07-01 DIAGNOSIS — I10 Essential (primary) hypertension: Secondary | ICD-10-CM | POA: Diagnosis not present

## 2021-07-01 DIAGNOSIS — I428 Other cardiomyopathies: Secondary | ICD-10-CM

## 2021-07-01 DIAGNOSIS — Z95810 Presence of automatic (implantable) cardiac defibrillator: Secondary | ICD-10-CM

## 2021-07-01 DIAGNOSIS — I5022 Chronic systolic (congestive) heart failure: Secondary | ICD-10-CM | POA: Diagnosis not present

## 2021-07-01 MED ORDER — METOPROLOL SUCCINATE ER 25 MG PO TB24: 25.0000 mg | ORAL_TABLET | Freq: Every day | ORAL | 3 refills | Status: DC

## 2021-07-01 NOTE — Progress Notes (Signed)
Electrophysiology Office Follow up Visit Note:    Date:  07/01/2021   ID:  Larry [name], DOB March 06, 1961, MRN [id number]  PCP:  Roberts Gaudy., MD  [facility] Cardiologist:  Armanda Magic, MD  [facility] Electrophysiologist:  Lanier Prude, MD    Interval History:    Larry [name] is a 60 y.o. male who presents for a follow up visit after ICD implant 01/14/2021 for NICM. Device interrogations since implant have shown stable device functio and no ICD shocks.  He is with his wife today in clinic.  Patient tells me that he feels like he is steadily declining.  His exercise tolerance is poor.  He tells me that he is exhausted and short of breath when he walked in from the parking lot today.  No syncope or presyncope.  No significant swelling.  Is using his CPAP at home.    Past Medical History:  Diagnosis Date   Arthritis    Nerve pain     Past Surgical History:  Procedure Laterality Date   CORONARY STENT INTERVENTION N/A 10/05/2020   Procedure: CORONARY STENT INTERVENTION;  Surgeon: Corky Crafts, MD;  Location: [facility] INVASIVE CV LAB;  Service: Cardiovascular;  Laterality: N/A;  LAD   ICD IMPLANT N/A 01/14/2021   Procedure: ICD IMPLANT;  Surgeon: Lanier Prude, MD;  Location: [facility] INVASIVE CV LAB;  Service: Cardiovascular;  Laterality: N/A;   INTRAVASCULAR PRESSURE WIRE/FFR STUDY N/A 10/05/2020   Procedure: INTRAVASCULAR PRESSURE WIRE/FFR STUDY;  Surgeon: Corky Crafts, MD;  Location: [facility] INVASIVE CV LAB;  Service: Cardiovascular;  Laterality: N/A;   INTRAVASCULAR ULTRASOUND/IVUS N/A 10/05/2020   Procedure: Intravascular Ultrasound/IVUS;  Surgeon: Corky Crafts, MD;  Location: [facility] INVASIVE CV LAB;  Service: Cardiovascular;  Laterality: N/A;  LAD   LEFT HEART CATH AND CORONARY ANGIOGRAPHY N/A 10/05/2020   Procedure: LEFT HEART CATH AND CORONARY ANGIOGRAPHY;  Surgeon: Corky Crafts, MD;  Location: [facility] INVASIVE CV LAB;  Service: Cardiovascular;   Laterality: N/A;    Current Medications: Current Meds  Medication Sig   albuterol (VENTOLIN HFA) 108 (90 Base) MCG/ACT inhaler Inhale 2 puffs into the lungs every 6 (six) hours as needed for wheezing or shortness of breath.   aspirin EC 81 MG tablet Take 81 mg by mouth daily. Swallow whole.   atorvastatin (LIPITOR) 80 MG tablet Take 1 tablet (80 mg total) by mouth daily. (Patient taking differently: Take 80 mg by mouth every evening.)   buPROPion (WELLBUTRIN XL) 150 MG 24 hr tablet Take 150 mg by mouth daily.   clopidogrel (PLAVIX) 75 MG tablet Take 1 tablet (75 mg total) by mouth daily.   Diroximel Fumarate (VUMERITY) 231 MG CPDR Take 462 mg by mouth in the morning and at bedtime.    DULoxetine (CYMBALTA) 60 MG capsule Take 60 mg by mouth daily.   ezetimibe (ZETIA) 10 MG tablet Take 1 tablet (10 mg total) by mouth daily.   levothyroxine (SYNTHROID) 50 MCG tablet Take 50 mcg by mouth daily before breakfast.   losartan (COZAAR) 25 MG tablet Take 0.5 tablets (12.5 mg total) by mouth daily.   methocarbamol (ROBAXIN) 750 MG tablet Take 750 mg by mouth 3 (three) times daily. Scheduled   midodrine (PROAMATINE) 5 MG tablet Take 5 mg by mouth daily.   Multiple Vitamin (MULTIVITAMIN WITH MINERALS) TABS tablet Take 1 tablet by mouth daily.   pantoprazole (PROTONIX) 40 MG tablet Take 1 tablet (40 mg total) by mouth daily.   pregabalin (  LYRICA) 150 MG capsule Take 150 mg by mouth in the morning, at noon, and at bedtime.   zolpidem (AMBIEN) 10 MG tablet Take 10 mg by mouth at bedtime.     Allergies:   Aspirin   Social History   Socioeconomic History   Marital status: Married    Spouse name: Not on file   Number of children: Not on file   Years of education: Not on file   Highest education level: Not on file  Occupational History   Not on file  Tobacco Use   Smoking status: Every Day    Types: Cigarettes   Smokeless tobacco: Never  Substance and Sexual Activity   Alcohol use: No   Drug  use: No   Sexual activity: Not on file  Other Topics Concern   Not on file  Social History Narrative   Not on file   Social Determinants of Health   Financial Resource Strain: Not on file  Food Insecurity: Not on file  Transportation Needs: Not on file  Physical Activity: Not on file  Stress: Not on file  Social Connections: Not on file     Family History: The patient's family history includes Heart attack (age of onset: 4) in his mother. There is no history of Lung cancer.  ROS:   Please see the history of present illness.    All other systems reviewed and are negative.  EKGs/Labs/Other Studies Reviewed:    The following studies were reviewed today:  July 01, 2021 device interrogation in clinic personally reviewed Lead parameter stable.  Battery longevity estimated at 15 years.  No device therapies have not delivered.  Today we have reprogrammed ventricular lead output given it is 90 days post implant.  EKG:  The ekg ordered today demonstrates sinus rhythm.  Recent Labs: 09/12/2020: B Natriuretic Peptide 370.0 09/13/2020: Magnesium 2.4 12/21/2020: BUN 12; Creatinine, Ser 1.21; Hemoglobin 15.8; Platelets 145; Potassium 3.9; Sodium 139 01/12/2021: ALT 21  Recent Lipid Panel    Component Value Date/Time   CHOL 92 (L) 01/12/2021 1139   TRIG 73 01/12/2021 1139   HDL 32 (L) 01/12/2021 1139   CHOLHDL 2.9 01/12/2021 1139   LDLCALC 44 01/12/2021 1139    Physical Exam:    VS:  BP 112/82   Pulse 71   Ht 5\' 10"  (1.778 m)   Wt 262 lb 3.2 oz (118.9 kg)   SpO2 95%   BMI 37.62 kg/m     Wt Readings from Last 3 Encounters:  07/01/21 262 lb 3.2 oz (118.9 kg)  01/14/21 250 lb (113.4 kg)  12/21/20 253 lb (114.8 kg)     GEN:  Well nourished, well developed in no acute distress HEENT: Normal NECK: No JVD; No carotid bruits LYMPHATICS: No lymphadenopathy CARDIAC: RRR, no murmurs, rubs, gallops RESPIRATORY:  Clear to auscultation without rales, wheezing or rhonchi  ABDOMEN:  Soft, non-tender, non-distended MUSCULOSKELETAL:  No edema; No deformity  SKIN: Tepid and dry NEUROLOGIC:  Alert and oriented x 3 PSYCHIATRIC:  Normal affect   ASSESSMENT:    1. Nonischemic cardiomyopathy (HCC)   2. Chronic systolic heart failure (HCC)   3. Cardiac defibrillator in situ   4. Primary hypertension    PLAN:    In order of problems listed above:   1. Nonischemic cardiomyopathy (HCC) NYHA class III.  Warm and dry on exam.  He is concerned that his exercise capacity has worsened.  For now would like to continue him on losartan.  He is tepid  on exam.  Because of this I do not feel comfortable starting a beta-blocker.  His recurrent lightheadedness at home and marginal blood pressures in clinic making initiation of Entresto difficult.  ICD interrogation not suggestive of worsening volume status.  I will plan to refer him to the heart failure clinic for further medication titration and evaluation.  2. Chronic systolic heart failure (HCC) See #1  3. Cardiac defibrillator in situ Device functioning well.  No shocks.  4. Primary hypertension    Medication Adjustments/Labs and Tests Ordered: Current medicines are reviewed at length with the patient today.  Concerns regarding medicines are outlined above.  Orders Placed This Encounter  Procedures   EKG 12-Lead   No orders of the defined types were placed in this encounter.    Signed, Steffanie Dunn, MD, [facility], [facility] 07/01/2021 2:34 PM    Electrophysiology [facility]

## 2021-07-01 NOTE — Patient Instructions (Addendum)
Medication Instructions:  Your physician recommends that you continue on your current medications as directed. Please refer to the Current Medication list given to you today. *If you need a refill on your cardiac medications before your next appointment, please call your pharmacy*  Lab Work: None ordered. If you have labs (blood work) drawn today and your tests are completely normal, you will receive your results only by: MyChart Message (if you have MyChart) OR A paper copy in the mail If you have any lab test that is abnormal or we need to change your treatment, we will call you to review the results.  Testing/Procedures: Your physician has requested that you have an echocardiogram. Echocardiography is a painless test that uses sound waves to create images of your heart. It provides your doctor with information about the size and shape of your heart and how well your heart's chambers and valves are working. This procedure takes approximately one hour. There are no restrictions for this procedure.  Please schedule for ECHO  Follow-Up:  You are being referred to the Advanced Heart Failure clinic.  Phone # [phone]  Your next appointment:   Your physician wants you to follow-up in: one year with Dr. Lalla Brothers.   You will receive a reminder letter in the mail two months in advance. If you don't receive a letter, please call our office to schedule the follow-up appointment.  Remote monitoring is used to monitor your ICD from home. This monitoring reduces the number of office visits required to check your device to one time per year. It allows [place] to keep an eye on the functioning of your device to ensure it is working properly. You are scheduled for a device check from home on 08/17/2021. You may send your transmission at any time that day. If you have a wireless device, the transmission will be sent automatically. After your physician reviews your transmission, you will receive a postcard with  your next transmission date.

## 2021-07-21 ENCOUNTER — Ambulatory Visit (HOSPITAL_COMMUNITY): Payer: Medicare HMO | Attending: Cardiovascular Disease

## 2021-07-21 ENCOUNTER — Other Ambulatory Visit: Payer: Self-pay

## 2021-07-21 DIAGNOSIS — I10 Essential (primary) hypertension: Secondary | ICD-10-CM | POA: Diagnosis present

## 2021-07-21 DIAGNOSIS — I5022 Chronic systolic (congestive) heart failure: Secondary | ICD-10-CM | POA: Insufficient documentation

## 2021-07-21 DIAGNOSIS — I428 Other cardiomyopathies: Secondary | ICD-10-CM | POA: Insufficient documentation

## 2021-07-21 DIAGNOSIS — Z95810 Presence of automatic (implantable) cardiac defibrillator: Secondary | ICD-10-CM | POA: Diagnosis present

## 2021-07-21 LAB — ECHOCARDIOGRAM COMPLETE
Area-P 1/2: 2.24 cm2
S' Lateral: 5.5 cm

## 2021-07-21 MED ORDER — PERFLUTREN LIPID MICROSPHERE
1.0000 mL | INTRAVENOUS | Status: AC | PRN
Start: 2021-07-21 — End: 2021-07-21
  Administered 2021-07-21: 1 mL via INTRAVENOUS

## 2021-08-05 ENCOUNTER — Encounter (HOSPITAL_COMMUNITY): Payer: Medicare HMO | Admitting: Internal Medicine

## 2021-08-08 ENCOUNTER — Other Ambulatory Visit: Payer: Self-pay

## 2021-08-08 ENCOUNTER — Ambulatory Visit (HOSPITAL_COMMUNITY)
Admission: RE | Admit: 2021-08-08 | Discharge: 2021-08-08 | Disposition: A | Discharge status: Home / Self Care | Payer: Medicare HMO | Source: Ambulatory Visit | Attending: Internal Medicine | Admitting: Internal Medicine

## 2021-08-08 ENCOUNTER — Encounter (HOSPITAL_COMMUNITY): Payer: Self-pay | Admitting: Internal Medicine

## 2021-08-08 VITALS — BP 130/80 | HR 70 | Wt 263.2 lb

## 2021-08-08 DIAGNOSIS — Z6837 Body mass index (BMI) 37.0-37.9, adult: Secondary | ICD-10-CM | POA: Diagnosis not present

## 2021-08-08 DIAGNOSIS — E039 Hypothyroidism, unspecified: Secondary | ICD-10-CM | POA: Diagnosis not present

## 2021-08-08 DIAGNOSIS — J449 Chronic obstructive pulmonary disease, unspecified: Secondary | ICD-10-CM | POA: Diagnosis not present

## 2021-08-08 DIAGNOSIS — G35 Multiple sclerosis: Secondary | ICD-10-CM | POA: Diagnosis not present

## 2021-08-08 DIAGNOSIS — I2510 Atherosclerotic heart disease of native coronary artery without angina pectoris: Secondary | ICD-10-CM | POA: Diagnosis not present

## 2021-08-08 DIAGNOSIS — Z7984 Long term (current) use of oral hypoglycemic drugs: Secondary | ICD-10-CM | POA: Insufficient documentation

## 2021-08-08 DIAGNOSIS — Z79899 Other long term (current) drug therapy: Secondary | ICD-10-CM | POA: Diagnosis not present

## 2021-08-08 DIAGNOSIS — E119 Type 2 diabetes mellitus without complications: Secondary | ICD-10-CM | POA: Diagnosis not present

## 2021-08-08 DIAGNOSIS — I428 Other cardiomyopathies: Secondary | ICD-10-CM | POA: Diagnosis not present

## 2021-08-08 DIAGNOSIS — E669 Obesity, unspecified: Secondary | ICD-10-CM | POA: Insufficient documentation

## 2021-08-08 DIAGNOSIS — G4733 Obstructive sleep apnea (adult) (pediatric): Secondary | ICD-10-CM | POA: Insufficient documentation

## 2021-08-08 DIAGNOSIS — Z955 Presence of coronary angioplasty implant and graft: Secondary | ICD-10-CM | POA: Insufficient documentation

## 2021-08-08 DIAGNOSIS — Z8249 Family history of ischemic heart disease and other diseases of the circulatory system: Secondary | ICD-10-CM | POA: Insufficient documentation

## 2021-08-08 DIAGNOSIS — Z720 Tobacco use: Secondary | ICD-10-CM | POA: Diagnosis not present

## 2021-08-08 DIAGNOSIS — Z7982 Long term (current) use of aspirin: Secondary | ICD-10-CM | POA: Insufficient documentation

## 2021-08-08 DIAGNOSIS — I5022 Chronic systolic (congestive) heart failure: Secondary | ICD-10-CM

## 2021-08-08 DIAGNOSIS — I252 Old myocardial infarction: Secondary | ICD-10-CM | POA: Insufficient documentation

## 2021-08-08 DIAGNOSIS — I110 Hypertensive heart disease with heart failure: Secondary | ICD-10-CM | POA: Insufficient documentation

## 2021-08-08 DIAGNOSIS — Z95810 Presence of automatic (implantable) cardiac defibrillator: Secondary | ICD-10-CM | POA: Insufficient documentation

## 2021-08-08 DIAGNOSIS — Z7902 Long term (current) use of antithrombotics/antiplatelets: Secondary | ICD-10-CM | POA: Insufficient documentation

## 2021-08-08 DIAGNOSIS — I429 Cardiomyopathy, unspecified: Secondary | ICD-10-CM

## 2021-08-08 DIAGNOSIS — Z79890 Hormone replacement therapy (postmenopausal): Secondary | ICD-10-CM | POA: Diagnosis not present

## 2021-08-08 DIAGNOSIS — I951 Orthostatic hypotension: Secondary | ICD-10-CM

## 2021-08-08 DIAGNOSIS — F17210 Nicotine dependence, cigarettes, uncomplicated: Secondary | ICD-10-CM | POA: Diagnosis not present

## 2021-08-08 MED ORDER — EMPAGLIFLOZIN 10 MG PO TABS: 10.0000 mg | ORAL_TABLET | Freq: Every day | ORAL | 11 refills | Status: DC

## 2021-08-08 NOTE — Progress Notes (Signed)
ADVANCED HF CLINIC CONSULT NOTE  Referring Physician: Dr Lalla Brothers   Primary Care: Dr Lawerance Bach Primary Cardiologist:  HPI: Larry [name] is a 60 year old referred to the Advanced Heart Failure Clinic by Dr Lalla Brothers.   Larry [name] is a 51 year old with a history of obesity, multiple sclerosis, COPD with ongoing tobacco use, HTN,  DM2, OSA, CAD with DES mid LAD and chronic systolic heart failure due to iCM s/p BSci ICD  Admitted 9/21 acute hypoxemia respiratory failure +  syncope. + RSV. ECHO showed reduced EF 40-45%. Cardiology consulted and placed holter monitor and set up for outpatient Coronary CT.   + Coronary CT --> had LHC in 09/2021 with mid RCA 100%, mid LAD 70%-->DES, 2nd diag lesion 70% lesion.   In November he was referred to Dr Lalla Brothers for syncope consultation. The patient described 2 different syncopal episodes with one concerning for orthostasis and the other concerning for arrhythmia. CMRI was ordered to help stratify risk.CMRI showed reduced EF <35% and large scar burden. ICD was set up for primary prevention.   Admitted for scheduled ICD implant 01/14/21.  Had single chamber [employer] placed. He has been followed by Dr Lalla Brothers and saw him earlier this month. GDMT limited by dizziness/ortho stasis and has been on midodrine. Has only tolerated losartan.   Complaining of ongoing dizziness. Uses cane or rollator. Mostly stays at home. Limited by leg fatigue and dyspnea. SOB with exertion. Denies PND/Orthopnea. No chest pain. Appetite ok. No fever or chills. Unable to weigh at home due to balance. Lives with his wife she helps him with ADLs. Smokes 1PPD. Taking all medications.  Cardiac Testing CMRI  11/2020 -LVEF is severely reduced at 30% and there is significant burden of transmural myocardial scarring (poor chance of recovery) in 7 segments (~ 40% of left ventricular myocardium).   Echo 07/2021 - EF 30-35% , HK LV, RV normal Echo 11/2020- EF 30-35%, RV normal     LHC  11/2020  Mid RCA lesion is 100% stenosed. Chronic total occlusion. Right to right and left to right collaterals. Mid LAD lesion is 70% stenosed. DFR 0.85. A drug-eluting stent was successfully placed using a SYNERGY XD 3.0X28. Post intervention, there is a 0% residual stenosis. 2nd Diag lesion is 70% stenosed. This is a small branch vessel, too small for stent. There is moderate left ventricular systolic dysfunction. The left ventricular ejection fraction is 35-45% by visual estimate. LV end diastolic pressure is normal. There is no aortic valve stenosis.  Coronary CT -09/2021 CAD-RADS 4 Severe stenosis. (70-99%). Cardiac catheterization is recommended.  Review of Systems: [y] = yes, [ ]  = no   General: Weight gain [ ] ; Weight loss [ ] ; Anorexia [ ] ; Fatigue [ Y]; Fever [ ] ; Chills [ ] ; Weakness [Y ]  Cardiac: Chest pain/pressure [ ] ; Resting SOB [ ] ; Exertional SOB [ Y]; Orthopnea [ ] ; Pedal Edema [ ] ; Palpitations [ ] ; Syncope [ ] ; Presyncope [ Y]; Paroxysmal nocturnal dyspnea[ ]   Pulmonary: Cough [ ] ; Wheezing[ ] ; Hemoptysis[ ] ; Sputum [ ] ; Snoring [ ]   GI: Vomiting[ ] ; Dysphagia[ ] ; Melena[ ] ; Hematochezia [ ] ; Heartburn[ ] ; Abdominal pain [ ] ; Constipation [ ] ; Diarrhea [ ] ; BRBPR [ ]   GU: Hematuria[ ] ; Dysuria [ ] ; Nocturia[ ]   Vascular: Pain in legs with walking [ ] ; Pain in feet with lying flat [ ] ; Non-healing sores [ ] ; Stroke [ ] ; TIA [ ] ; Slurred speech [ ] ;  Neuro: Headaches[ ] ;  Vertigo[ ] ; Seizures[ ] ; Paresthesias[ ] ;Blurred vision [ ] ; Diplopia [ ] ; Vision changes [ ]   Ortho/Skin: Arthritis [ ] ; Joint pain [ Y]; Muscle pain [ ] ; Joint swelling [ ] ; Back Pain [Y ]; Rash [ ]   Psych: Depression[ ] ; Anxiety[ ]   Heme: Bleeding problems [ ] ; Clotting disorders [ ] ; Anemia [ ]   Endocrine: Diabetes [ Y]; Thyroid dysfunction[Y ]   Past Medical History:  Diagnosis Date   Arthritis    Nerve pain     Current Outpatient Medications  Medication Sig Dispense Refill   albuterol  (VENTOLIN HFA) 108 (90 Base) MCG/ACT inhaler Inhale 2 puffs into the lungs every 6 (six) hours as needed for wheezing or shortness of breath. 8 g 2   aspirin EC 81 MG tablet Take 81 mg by mouth daily. Swallow whole.     atorvastatin (LIPITOR) 80 MG tablet Take 1 tablet (80 mg total) by mouth daily. 90 tablet 3   buPROPion (WELLBUTRIN XL) 150 MG 24 hr tablet Take 150 mg by mouth daily.     clopidogrel (PLAVIX) 75 MG tablet Take 1 tablet (75 mg total) by mouth daily. 90 tablet 3   Diroximel Fumarate (VUMERITY) 231 MG CPDR Take 462 mg by mouth in the morning and at bedtime.      DULoxetine (CYMBALTA) 60 MG capsule Take 60 mg by mouth daily.  5   ezetimibe (ZETIA) 10 MG tablet Take 1 tablet (10 mg total) by mouth daily. 90 tablet 3   levothyroxine (SYNTHROID) 50 MCG tablet Take 50 mcg by mouth daily before breakfast.     losartan (COZAAR) 25 MG tablet Take 0.5 tablets (12.5 mg total) by mouth daily. 30 tablet 3   methocarbamol (ROBAXIN) 750 MG tablet Take 750 mg by mouth 3 (three) times daily. Scheduled     midodrine (PROAMATINE) 5 MG tablet Take 5 mg by mouth daily.     Multiple Vitamin (MULTIVITAMIN WITH MINERALS) TABS tablet Take 1 tablet by mouth daily.     nitroGLYCERIN (NITROSTAT) 0.4 MG SL tablet Place 1 tablet (0.4 mg total) under the tongue every 5 (five) minutes as needed for chest pain. 25 tablet 3   pantoprazole (PROTONIX) 40 MG tablet Take 1 tablet (40 mg total) by mouth daily. 90 tablet 3   pregabalin (LYRICA) 150 MG capsule Take 150 mg by mouth in the morning, at noon, and at bedtime.     zolpidem (AMBIEN) 10 MG tablet Take 10 mg by mouth at bedtime.     No current facility-administered medications for this encounter.    Allergies  Allergen Reactions   Aspirin Other (See Comments)    Nose Bleeding (with high doses)      Social History   Socioeconomic History   Marital status: Married    Spouse name: Not on file   Number of children: Not on file   Years of education: Not  on file   Highest education level: Not on file  Occupational History   Not on file  Tobacco Use   Smoking status: Every Day    Types: Cigarettes   Smokeless tobacco: Never  Substance and Sexual Activity   Alcohol use: No   Drug use: No   Sexual activity: Not on file  Other Topics Concern   Not on file  Social History Narrative   Not on file   Social Determinants of Health   Financial Resource Strain: Not on file  Food Insecurity: Not on file  Transportation Needs: Not on  file  Physical Activity: Not on file  Stress: Not on file  Social Connections: Not on file  Intimate Partner Violence: Not on file      Family History  Problem Relation Age of Onset   Heart attack Mother 24   Lung cancer Neg Hx     Vitals:   08/08/21 1519  BP: 130/80  Pulse: 70  SpO2: 96%  Weight: 119.4 kg (263 lb 3.2 oz)    PHYSICAL EXAM: General:  Well appearing. No respiratory difficulty HEENT: normal Neck: supple. no JVD. Carotids 2+ bilat; no bruits. No lymphadenopathy or thryomegaly appreciated. Cor: PMI nondisplaced. Regular rate & rhythm. No rubs, gallops or murmurs. Lungs: clear Abdomen: soft, nontender, nondistended. No hepatosplenomegaly. No bruits or masses. Good bowel sounds. Extremities: no cyanosis, clubbing, rash, edema Neuro: alert & oriented x 3, cranial nerves grossly intact. moves all 4 extremities w/o difficulty. Affect pleasant.  ECG: SR 78 bpm QRS 96 ms.    ASSESSMENT & PLAN:  Chronic Systolic Heart Failure CMRI 11/2020 -LVEF is severely reduced at 30% and there is significant burden of transmural myocardial scarring (poor chance of recovery) in 7 segments (~ 40% of left ventricular myocardium).  - 12/2020 [employer] ICD placed. Echo 07/2021 - EF 30-35% , HK LV, RV normal NYHA III. Volume status appears stable. Reds Clip 31% - on midodrine 5 mg daily.  -Continue losartan 12.5 mg dialy  - Unable to initiate additional GDMT with profound dizziness.   2.  CAD -LHC 09/2020 -  mid RCA 100%, mid LAD 70%-->DES, 2nd diag lesion 70% lesion. On asa, statin, plavix   3. Hypothyroidism  -On levothyroxine.  Per PCP   4. DMII  5. OSA Continue CPAP  6. MS  Disabled since 2017.   Tonye Becket, NP   Patient seen and examined with the above-signed Advanced Practice Provider and/or Housestaff. I personally reviewed laboratory data, imaging studies and relevant notes. I independently examined the patient and formulated the important aspects of the plan. I have edited the note to reflect any of my changes or salient points. I have personally discussed the plan with the patient and/or family.  60 y/o male as above with multiple medical issues including obesity, MS, COPD with ongoing tobacco use, OSA on CPAP, CAD with iCM EF 30%.  At baseline he is quite limited NYHA III-IIIB despite well compensated volume status. Has not been abe to tolerated GDMT due to orthostasis. Now on midodrine.   General:  Obese male sitting in clinic  No resp difficulty HEENT: normal Neck: supple. no JVD. Carotids 2+ bilat; no bruits. No lymphadenopathy or thryomegaly appreciated. Cor: PMI nondisplaced. Regular rate & rhythm. No rubs, gallops or murmurs. Lungs: decreased throughout Abdomen: obese soft, nontender, nondistended. No hepatosplenomegaly. No bruits or masses. Good bowel sounds. Extremities: no cyanosis, clubbing, rash, edema Neuro: alert & orientedx3, cranial nerves grossly intact. moves all 4 extremities w/o difficulty. Affect pleasant  Difficult situation. He is quite limited but limitations appears multifactorial (MS, COPD, obesity, deconditioning, HF, orthostasis) and I do not think he is a candidate for, or would benefit from advanced therapies at this point. That said, I would also not take the option completely off the table just yet. Unfortunately GDMT has not been well tolerated due to orthostasis. Volume status looks good on exam and by ReDS (31%).  At this  point I have recommended:   1) change midodrine to 2.5 tid (from 5mg  daily) 2) work on smoking cessation 3) attempt to  walk for 5 minutes 2-3x/day around the house with goal of 10 min/3xday 4) refer to outpatient PT 5) start Jardiance 10 mg daily  We will re-evaluate in 3-4 months and if making progress can consider bicycle CPX to further risk stratify and ascertain how much of his limitation is due to HF vs other causes.   Arvilla Meres, MD  12:42 AM

## 2021-08-08 NOTE — Patient Instructions (Addendum)
EKG done today.  RedsClip done today.  No Labs done today.   START Jardiance 10mg  (1 tablet) by mouth daily.   No other medication changes were made. Please continue all current medications as prescribed.  You have been referred to Physical Therapy. They will contact you to schedule an appointment.   Your physician recommends that you schedule a follow-up appointment in: 3-4 months  If you have any questions or concerns before your next appointment please send a message through [place] or call our office at [phone].    TO LEAVE A MESSAGE FOR THE NURSE SELECT OPTION 2, PLEASE LEAVE A MESSAGE INCLUDING: YOUR NAME DATE OF BIRTH CALL BACK NUMBER REASON FOR CALL**this is important as we prioritize the call backs  YOU WILL RECEIVE A CALL BACK THE SAME DAY AS LONG AS YOU CALL BEFORE 4:00 PM   Do the following things EVERYDAY: Weigh yourself in the morning before breakfast. Write it down and keep it in a log. Take your medicines as prescribed Eat low salt foods--Limit salt (sodium) to 2000 mg per day.  Stay as active as you can everyday Limit all fluids for the day to less than 2 liters   At the Advanced Heart Failure Clinic, you and your health needs are our priority. As part of our continuing mission to provide you with exceptional heart care, we have created designated Provider Care Teams. These Care Teams include your primary Cardiologist (physician) and Advanced Practice Providers (APPs- Physician Assistants and Nurse Practitioners) who all work together to provide you with the care you need, when you need it.   You may see any of the following providers on your designated Care Team at your next follow up: Dr [phone] Dr Arvilla Meres, NP Carron Curie, Robbie Lis [place], PharmD   Please be sure to bring in all your medications bottles to every appointment.

## 2021-08-08 NOTE — Progress Notes (Signed)
ReDS Vest / Clip - 08/08/21 1600       ReDS Vest / Clip   Station Marker D    Ruler Value 41    ReDS Value Range Low volume    ReDS Actual Value 31

## 2021-08-09 ENCOUNTER — Other Ambulatory Visit: Payer: Self-pay | Admitting: Cardiology

## 2021-08-17 ENCOUNTER — Ambulatory Visit (INDEPENDENT_AMBULATORY_CARE_PROVIDER_SITE_OTHER): Payer: Medicare HMO

## 2021-08-17 DIAGNOSIS — I429 Cardiomyopathy, unspecified: Secondary | ICD-10-CM | POA: Diagnosis not present

## 2021-08-17 LAB — CUP PACEART REMOTE DEVICE CHECK
Battery Remaining Longevity: 180 mo
Battery Remaining Percentage: 100 %
Brady Statistic RV Percent Paced: 0 %
Date Time Interrogation Session: 20220831015100
HighPow Impedance: 88 Ohm
Implantable Lead Implant Date: 20220128
Implantable Lead Location: 753860
Implantable Lead Model: 673
Implantable Lead Serial Number: 151771
Implantable Pulse Generator Implant Date: 20220128
Lead Channel Impedance Value: 361 Ohm
Lead Channel Setting Pacing Amplitude: 2.5 V
Lead Channel Setting Pacing Pulse Width: 0.4 ms
Lead Channel Setting Sensing Sensitivity: 0.5 mV
Pulse Gen Serial Number: 289752

## 2021-08-30 NOTE — Progress Notes (Signed)
Remote ICD transmission.   

## 2021-10-06 ENCOUNTER — Encounter (HOSPITAL_COMMUNITY): Payer: Self-pay

## 2021-10-06 ENCOUNTER — Other Ambulatory Visit (HOSPITAL_COMMUNITY): Payer: Self-pay | Admitting: *Deleted

## 2021-10-06 MED ORDER — MIDODRINE HCL 5 MG PO TABS: 5.0000 mg | ORAL_TABLET | Freq: Three times a day (TID) | ORAL | 3 refills | Status: DC

## 2021-11-08 ENCOUNTER — Encounter (HOSPITAL_COMMUNITY): Payer: Medicare HMO | Admitting: Internal Medicine

## 2021-11-16 ENCOUNTER — Ambulatory Visit (INDEPENDENT_AMBULATORY_CARE_PROVIDER_SITE_OTHER): Payer: Medicare HMO

## 2021-11-16 DIAGNOSIS — I429 Cardiomyopathy, unspecified: Secondary | ICD-10-CM | POA: Diagnosis not present

## 2021-11-17 LAB — CUP PACEART REMOTE DEVICE CHECK
Battery Remaining Longevity: 180 mo
Battery Remaining Percentage: 100 %
Brady Statistic RV Percent Paced: 0 %
Date Time Interrogation Session: 20221130015100
HighPow Impedance: 98 Ohm
Implantable Lead Implant Date: 20220128
Implantable Lead Location: 753860
Implantable Lead Model: 673
Implantable Lead Serial Number: 151771
Implantable Pulse Generator Implant Date: 20220128
Lead Channel Impedance Value: 374 Ohm
Lead Channel Setting Pacing Amplitude: 2.5 V
Lead Channel Setting Pacing Pulse Width: 0.4 ms
Lead Channel Setting Sensing Sensitivity: 0.5 mV
Pulse Gen Serial Number: 289752

## 2021-11-25 NOTE — Progress Notes (Signed)
Remote ICD transmission.   

## 2022-01-13 ENCOUNTER — Encounter (HOSPITAL_COMMUNITY): Payer: Self-pay | Admitting: Internal Medicine

## 2022-01-13 ENCOUNTER — Encounter: Payer: Self-pay | Admitting: Internal Medicine

## 2022-01-13 ENCOUNTER — Ambulatory Visit (HOSPITAL_COMMUNITY)
Admission: RE | Admit: 2022-01-13 | Discharge: 2022-01-13 | Disposition: A | Discharge status: Home / Self Care | Payer: Medicare HMO | Source: Ambulatory Visit | Attending: Internal Medicine | Admitting: Internal Medicine

## 2022-01-13 ENCOUNTER — Other Ambulatory Visit: Payer: Self-pay

## 2022-01-13 VITALS — Wt 251.0 lb

## 2022-01-13 DIAGNOSIS — Z955 Presence of coronary angioplasty implant and graft: Secondary | ICD-10-CM | POA: Diagnosis not present

## 2022-01-13 DIAGNOSIS — I2510 Atherosclerotic heart disease of native coronary artery without angina pectoris: Secondary | ICD-10-CM | POA: Diagnosis not present

## 2022-01-13 DIAGNOSIS — I5022 Chronic systolic (congestive) heart failure: Secondary | ICD-10-CM | POA: Diagnosis not present

## 2022-01-13 DIAGNOSIS — Z79899 Other long term (current) drug therapy: Secondary | ICD-10-CM | POA: Insufficient documentation

## 2022-01-13 DIAGNOSIS — G4733 Obstructive sleep apnea (adult) (pediatric): Secondary | ICD-10-CM | POA: Diagnosis not present

## 2022-01-13 DIAGNOSIS — I110 Hypertensive heart disease with heart failure: Secondary | ICD-10-CM | POA: Diagnosis not present

## 2022-01-13 DIAGNOSIS — E119 Type 2 diabetes mellitus without complications: Secondary | ICD-10-CM | POA: Insufficient documentation

## 2022-01-13 DIAGNOSIS — E669 Obesity, unspecified: Secondary | ICD-10-CM | POA: Diagnosis not present

## 2022-01-13 DIAGNOSIS — G35 Multiple sclerosis: Secondary | ICD-10-CM | POA: Diagnosis not present

## 2022-01-13 DIAGNOSIS — J449 Chronic obstructive pulmonary disease, unspecified: Secondary | ICD-10-CM | POA: Diagnosis not present

## 2022-01-13 DIAGNOSIS — Z720 Tobacco use: Secondary | ICD-10-CM

## 2022-01-13 DIAGNOSIS — R42 Dizziness and giddiness: Secondary | ICD-10-CM | POA: Insufficient documentation

## 2022-01-13 NOTE — Patient Instructions (Signed)
Stop Losartan  Please wear your compression hose daily, place them on as soon as you get up in the morning and remove before you go to bed at night.  Congratulations!!! You have graduated the [facility], please follow-up with Dr Radford Pax at [facility] for your cardiac needs. Her office will call you for an appointment

## 2022-01-13 NOTE — Progress Notes (Signed)
ADVANCED HF CLINIC NOTE  Referring Physician: Dr Lalla Brothers   Primary Care: Dr Lawerance Bach Primary Cardiologist: Armanda Magic, MD   HPI:  Mr Gillespie is a 61 y/o male with obesity, multiple sclerosis, COPD with ongoing tobacco use, HTN,  DM2, OSA, CAD with DES mid LAD and chronic systolic heart failure due to iCM s/p BSci ICD  Admitted 9/21 acute hypoxemia respiratory failure +  syncope. + RSV. ECHO EF 40-45%. Cardiology consulted and placed holter monitor and set up for outpatient Coronary CT.   + Coronary CT --> had LHC in 09/2021 with mid RCA 100%, mid LAD 70%-->DES, 2nd diag lesion 70% lesion.   11/21 he was referred to Dr Lalla Brothers for syncope consultation. The patient described 2 different syncopal episodes with one concerning for orthostasis and the other concerning for arrhythmia. CMRI was ordered to help stratify risk.CMRI showed reduced EF <35% and large scar burden. ICD was set up for primary prevention.   01/14/21.  Had single chamber [employer] placed.  Referred to HF Clinic in 11/22. Felt limitations was multifactorial and felt not to be candidate for advanced therapies. Recommended:  1) change midodrine to 2.5 tid (from 5mg  daily) 2) work on smoking cessation 3) attempt to walk for 5 minutes 2-3x/day around the house with goal of 10 min/3xday 4) refer to outpatient PT 5) start Jardiance 10 mg daily  Here for f/u with his wife. Says he still feels severely dizzy when he stands up and starts walking. Says he is not dizzy immediately but it occurs when he starts walking. Wife says he shakes a lot. Checks his BP sitting and standing and it is always good.  Saw his Neurologist ) Had brain MRI and no new MS lesion. Carotid u/s ok. + SOB with mild activity. Has occasional CP. Still smoking 1/2 ppd. Drinks a lot of fluid (water and [employer]). Doesn't think Jardiance made it worse  Cardiac Testing CMRI  11/2020 -LVEF is severely reduced at 30% and there is  significant burden of transmural myocardial scarring (poor chance of recovery) in 7 segments (~ 40% of left ventricular myocardium).   Echo 07/2021 - EF 30-35% , HK LV, RV normal Echo 11/2020- EF 30-35%, RV normal     LHC 11/2020  Mid RCA lesion is 100% stenosed. Chronic total occlusion. Right to right and left to right collaterals. Mid LAD lesion is 70% stenosed. DFR 0.85. A drug-eluting stent was successfully placed using a SYNERGY XD 3.0X28. Post intervention, there is a 0% residual stenosis. 2nd Diag lesion is 70% stenosed. This is a small branch vessel, too small for stent. There is moderate left ventricular systolic dysfunction. The left ventricular ejection fraction is 35-45% by visual estimate. LV end diastolic pressure is normal. There is no aortic valve stenosis.  Coronary CT -09/2021 CAD-RADS 4 Severe stenosis. (70-99%). Cardiac catheterization is recommended.    Past Medical History:  Diagnosis Date   Arthritis    Nerve pain     Current Outpatient Medications  Medication Sig Dispense Refill   albuterol (VENTOLIN HFA) 108 (90 Base) MCG/ACT inhaler Inhale 2 puffs into the lungs every 6 (six) hours as needed for wheezing or shortness of breath. 8 g 2   aspirin EC 81 MG tablet Take 81 mg by mouth daily. Swallow whole.     atorvastatin (LIPITOR) 80 MG tablet Take 1 tablet (80 mg total) by mouth daily. 90 tablet 3   buPROPion (WELLBUTRIN XL) 150 MG 24 hr tablet Take 150  mg by mouth daily.     clopidogrel (PLAVIX) 75 MG tablet Take 1 tablet (75 mg total) by mouth daily. 90 tablet 3   Diroximel Fumarate (VUMERITY) 231 MG CPDR Take 462 mg by mouth in the morning and at bedtime.      DULoxetine (CYMBALTA) 60 MG capsule Take 60 mg by mouth daily.  5   empagliflozin (JARDIANCE) 10 MG TABS tablet Take 1 tablet (10 mg total) by mouth daily before breakfast. 30 tablet 11   ezetimibe (ZETIA) 10 MG tablet Take 1 tablet (10 mg total) by mouth daily. Please call and schedule an  appointment with Dr Mayford Knife for further refills 1st attempt 90 tablet 0   levothyroxine (SYNTHROID) 50 MCG tablet Take 50 mcg by mouth daily before breakfast.     losartan (COZAAR) 25 MG tablet Take 0.5 tablets (12.5 mg total) by mouth daily. 30 tablet 3   methocarbamol (ROBAXIN) 750 MG tablet Take 750 mg by mouth 3 (three) times daily. Scheduled     midodrine (PROAMATINE) 5 MG tablet Take 1 tablet (5 mg total) by mouth 3 (three) times daily with meals. 90 tablet 3   Multiple Vitamin (MULTIVITAMIN WITH MINERALS) TABS tablet Take 1 tablet by mouth daily.     nitroGLYCERIN (NITROSTAT) 0.4 MG SL tablet Place 1 tablet (0.4 mg total) under the tongue every 5 (five) minutes as needed for chest pain. 25 tablet 3   pantoprazole (PROTONIX) 40 MG tablet Take 1 tablet (40 mg total) by mouth daily. 90 tablet 3   pregabalin (LYRICA) 150 MG capsule Take 150 mg by mouth in the morning, at noon, and at bedtime.     zolpidem (AMBIEN) 10 MG tablet Take 10 mg by mouth at bedtime.     No current facility-administered medications for this encounter.    Allergies  Allergen Reactions   Aspirin Other (See Comments)    Nose Bleeding (with high doses)      Social History   Socioeconomic History   Marital status: Married    Spouse name: Not on file   Number of children: Not on file   Years of education: Not on file   Highest education level: Not on file  Occupational History   Not on file  Tobacco Use   Smoking status: Every Day    Types: Cigarettes   Smokeless tobacco: Never  Substance and Sexual Activity   Alcohol use: No   Drug use: No   Sexual activity: Not on file  Other Topics Concern   Not on file  Social History Narrative   Not on file   Social Determinants of Health   Financial Resource Strain: Not on file  Food Insecurity: Not on file  Transportation Needs: Not on file  Physical Activity: Not on file  Stress: Not on file  Social Connections: Not on file  Intimate Partner Violence:  Not on file      Family History  Problem Relation Age of Onset   Heart attack Mother 79   Lung cancer Neg Hx     Vitals:   01/13/22 1107  BP: 118/80  Pulse: 75  SpO2: 96%  Weight: 113.9 kg (251 lb)    Orthostatics checked in clinic on sitting/standing and walking and BP stable. Patient reported dizziness on standing and had to sit back down but BP unchanged  PHYSICAL EXAM: General:  Sitting in WC. No resp difficulty HEENT: normal Neck: supple. no JVD. Carotids 2+ bilat; no bruits. No lymphadenopathy or thryomegaly appreciated.  Cor: PMI nondisplaced. Regular rate & rhythm. No rubs, gallops or murmurs. Lungs: clear but decreased throughout  Abdomen: soft, nontender, nondistended. No hepatosplenomegaly. No bruits or masses. Good bowel sounds. Extremities: no cyanosis, clubbing, rash, edema Neuro: alert & orientedx3, cranial nerves grossly intact. moves all 4 extremities w/o difficulty. Affect pleasant    ASSESSMENT & PLAN:  1. Chronic Systolic Heart Failure CMRI 11/2020 -LVEF is severely reduced at 30% and there is significant burden of transmural myocardial scarring (poor chance of recovery) in 7 segments (~ 40% of left ventricular myocardium).  - 12/2020 [employer] ICD placed. - Echo 07/2021 - EF 30-35% , HK LV, RV normal - NYHA III predominantly related to dizziness - on midodrine 5 mg tid  - Stop losartan - Continue Jardiance 10  - Unable to initiate additional GDMT with need for midodrine and profound dizziness.  - Add compression stockings - He is not candidate for advanced therapies. - At this point we have little left to offer him in the AHF clinic. Will have him f/u with Dr. Mayford Knife  2. Dizziness - Unclear etiology. He has had an extensive evaluation by Neurology which was unrevealing and was told it was likely related to his heart - However both his wife and our staff have checked his BP/orthostatics in the setting of symptoms and BP was unchanged. Without  a drop in BP/perfusion, I don't see anyway that we can relate this to his heart.  - will stop losartan and let BP run higher. Add compression stockings - have suggested PT evaluation for balance assessment  3. CAD - LHC 09/2020 -  mid RCA 100%, mid LAD 70%-->DES, 2nd diag lesion 70% lesion. - Occasional CP -> follows with Dr. Mayford Knife.  - On asa, statin, plavix   3. DMII - On Jardiance   4. OSA - Continue CPAP  5. MS  - Disabled since 2017.  - Follows with Neurology  Total time spent 35 minutes. Over half that time spent discussing above.    Arvilla Meres, MD  11:23 AM

## 2022-01-13 NOTE — Progress Notes (Signed)
B/p After walking 120/80 HR 73. Larry [name] aware of orthostatic b/p

## 2022-01-27 ENCOUNTER — Other Ambulatory Visit: Payer: Self-pay | Admitting: Cardiology

## 2022-02-15 ENCOUNTER — Other Ambulatory Visit (HOSPITAL_COMMUNITY): Payer: Self-pay

## 2022-02-15 ENCOUNTER — Ambulatory Visit (INDEPENDENT_AMBULATORY_CARE_PROVIDER_SITE_OTHER): Payer: Medicare HMO

## 2022-02-15 DIAGNOSIS — I429 Cardiomyopathy, unspecified: Secondary | ICD-10-CM

## 2022-02-15 LAB — CUP PACEART REMOTE DEVICE CHECK
Battery Remaining Longevity: 180 mo
Battery Remaining Percentage: 100 %
Brady Statistic RV Percent Paced: 0 %
Date Time Interrogation Session: 20230301015100
HighPow Impedance: 92 Ohm
Implantable Lead Implant Date: 20220128
Implantable Lead Location: 753860
Implantable Lead Model: 673
Implantable Lead Serial Number: 151771
Implantable Pulse Generator Implant Date: 20220128
Lead Channel Impedance Value: 340 Ohm
Lead Channel Setting Pacing Amplitude: 2.5 V
Lead Channel Setting Pacing Pulse Width: 0.4 ms
Lead Channel Setting Sensing Sensitivity: 0.5 mV
Pulse Gen Serial Number: 289752

## 2022-02-15 MED ORDER — MIDODRINE HCL 5 MG PO TABS: 5.0000 mg | ORAL_TABLET | Freq: Three times a day (TID) | ORAL | 3 refills | Status: DC

## 2022-02-23 NOTE — Progress Notes (Signed)
Remote ICD transmission.   

## 2022-02-24 ENCOUNTER — Other Ambulatory Visit: Payer: Self-pay

## 2022-02-24 ENCOUNTER — Encounter: Payer: Self-pay | Admitting: Cardiology

## 2022-02-24 ENCOUNTER — Ambulatory Visit: Payer: Medicare HMO | Admitting: Cardiology

## 2022-02-24 VITALS — BP 130/78 | HR 79 | Ht 70.0 in | Wt 252.2 lb

## 2022-02-24 DIAGNOSIS — I10 Essential (primary) hypertension: Secondary | ICD-10-CM | POA: Diagnosis not present

## 2022-02-24 DIAGNOSIS — I5022 Chronic systolic (congestive) heart failure: Secondary | ICD-10-CM

## 2022-02-24 DIAGNOSIS — E782 Mixed hyperlipidemia: Secondary | ICD-10-CM

## 2022-02-24 DIAGNOSIS — R072 Precordial pain: Secondary | ICD-10-CM

## 2022-02-24 DIAGNOSIS — I2510 Atherosclerotic heart disease of native coronary artery without angina pectoris: Secondary | ICD-10-CM

## 2022-02-24 DIAGNOSIS — R42 Dizziness and giddiness: Secondary | ICD-10-CM

## 2022-02-24 NOTE — Addendum Note (Signed)
Addended by: Theresia Majors on: 02/24/2022 12:03 PM ? ? Modules accepted: Orders ? ?

## 2022-02-24 NOTE — Progress Notes (Signed)
Cardiology Office Note   Date:  02/24/2022   ID:  Larry [name], DOB December 07, 1961, MRN [id number]  PCP:  [name], MD  Cardiologist: Dr. Fransico Him, MD  Chief Complaint  Patient presents with   Coronary Artery Disease   Hypertension   Hyperlipidemia   Congestive Heart Failure   Cardiomyopathy    History of Present Illness: Larry [name] is a 61 y.o. male  with hx of MS, OSA on CPAP, peripheral neuropathy, HTN with orthostatic hypotension, HLD, DM2,  tobacco use, and recent RSV  who recently underwent outpatient cardiac catheterization secondary to positive cardiac CTA showing total RCA disease, LAD with 50 to 69% stenosis, CT FFR across the lesion showing 0.61 plaque with high risk features.  LCx noted with a 50 to 69% stenosis and CT FFR at 0.76.  Given this, OP LHC was arranged.    He underwent cardiac catheterization 10/05/2020 which showed a chronic total occlusion or mRCA with right to right and left to right collaterals. Mid LAD lesion is 70% stenosed (DFR 0.85) s/p DES placement.  There was a 70% 2nd diag, vessel small for stent.  Plan for DAPT with ASA/Plavix for at least 6 months and consider Plavix as monotherapy after 6 months. The patient was seen by cardiac rehab while in short stay. There were no observed complications post cath. Radial cath site was re-evaluated prior to discharge and found to be stable without any complications. Instructions/precautions regarding cath site care were given prior to discharge.  He has a hx of syncope which prompted admission to hospital.  This occurred while standing emptying some buckets.  He then had another episode of syncope that occurred while sitting in his chair and suddenly had a syncopal episode with no prodrome.  It was felt that his dizziness and syncope were related to orthostatic hypotension and he has been on compression hose and midodrine.  He recently saw Dr. Haroldine Laws advanced heart failure clinic.  His losartan  had to be stopped due to soft blood pressures and orthostatic hypotension with dizziness.  He has not been on a beta-blocker as well.  He was discharged from advanced heart failure clinic as there was no other treatment options.  He does have an ICD in for dilated cardiomyopathy as well as ventricular tachycardia.  He is followed in EP clinic.  He is on midodrine 5 mg 3 times daily and compression hose for his orthostatic hypotension and dizziness.  He does not tolerate ACE/ARB/ARNi or diuretics due to his orthostatic hypotension.   He is here today for followup and is doing well.  He has been having some chest pain that is on the right side.  He tells me the chest discomfort feels like a sharp pain with no radiation. He says that he has had bad SOB but also has bad back and leg pain.  He thinks the SOB is fairly the same as it has been in the past.  He denies any PND, orthopnea, palpitations or syncope. He occasionally has some mild LE edema. He still has some dizziness from time to time.  He is compliant with his meds and is tolerating meds with no SE.     Past Medical History:  Diagnosis Date   Arthritis    Nerve pain     Past Surgical History:  Procedure Laterality Date   CORONARY STENT INTERVENTION N/A 10/05/2020   Procedure: CORONARY STENT INTERVENTION;  Surgeon: Jettie Booze, MD;  Location:  [facility] INVASIVE CV LAB;  Service: Cardiovascular;  Laterality: N/A;  LAD   ICD IMPLANT N/A 01/14/2021   Procedure: ICD IMPLANT;  Surgeon: Lanier Prude, MD;  Location[facility] INVASIVE CV LAB;  Service: Cardiovascular;  Laterality: N/A;   INTRAVASCULAR PRESSURE WIRE/FFR STUDY N/A 10/05/2020   Procedure: INTRAVASCULAR PRESSURE WIRE/FFR STUDY;  Surgeon: Corky Crafts, MD;  Location: [facility] INVASIVE CV LAB;  Service: Cardiovascular;  Laterality: N/A;   INTRAVASCULAR ULTRASOUND/IVUS N/A 10/05/2020   Procedure: Intravascular Ultrasound/IVUS;  Surgeon: Corky Crafts, MD;  Location: [facility] INVASIVE CV  LAB;  Service: Cardiovascular;  Laterality: N/A;  LAD   LEFT HEART CATH AND CORONARY ANGIOGRAPHY N/A 10/05/2020   Procedure: LEFT HEART CATH AND CORONARY ANGIOGRAPHY;  Surgeon: Corky Crafts, MD;  Location: [facility] INVASIVE CV LAB;  Service: Cardiovascular;  Laterality: N/A;     Current Outpatient Medications  Medication Sig Dispense Refill   albuterol (VENTOLIN HFA) 108 (90 Base) MCG/ACT inhaler Inhale 2 puffs into the lungs every 6 (six) hours as needed for wheezing or shortness of breath. 8 g 2   aspirin EC 81 MG tablet Take 81 mg by mouth daily. Swallow whole.     atorvastatin (LIPITOR) 80 MG tablet Take 1 tablet (80 mg total) by mouth daily. 90 tablet 3   buPROPion (WELLBUTRIN XL) 150 MG 24 hr tablet Take 150 mg by mouth daily.     clopidogrel (PLAVIX) 75 MG tablet Take 1 tablet (75 mg total) by mouth daily. 90 tablet 3   Diroximel Fumarate (VUMERITY) 231 MG CPDR Take 462 mg by mouth in the morning and at bedtime.      DULoxetine (CYMBALTA) 60 MG capsule Take 60 mg by mouth daily.  5   empagliflozin (JARDIANCE) 10 MG TABS tablet Take 1 tablet (10 mg total) by mouth daily before breakfast. 30 tablet 11   ezetimibe (ZETIA) 10 MG tablet TAKE 1 TABLET(10 MG) BY MOUTH DAILY 90 tablet 0   levothyroxine (SYNTHROID) 50 MCG tablet Take 50 mcg by mouth daily before breakfast.     methocarbamol (ROBAXIN) 750 MG tablet Take 750 mg by mouth 3 (three) times daily. Scheduled     midodrine (PROAMATINE) 5 MG tablet Take 1 tablet (5 mg total) by mouth 3 (three) times daily with meals. 90 tablet 3   Multiple Vitamin (MULTIVITAMIN WITH MINERALS) TABS tablet Take 1 tablet by mouth daily.     nitroGLYCERIN (NITROSTAT) 0.4 MG SL tablet Place 1 tablet (0.4 mg total) under the tongue every 5 (five) minutes as needed for chest pain. 25 tablet 3   pantoprazole (PROTONIX) 40 MG tablet Take 1 tablet (40 mg total) by mouth daily. 90 tablet 3   pregabalin (LYRICA) 150 MG capsule Take 150 mg by mouth in the morning,  at noon, and at bedtime.     zolpidem (AMBIEN) 10 MG tablet Take 10 mg by mouth at bedtime.     No current facility-administered medications for this visit.    Allergies:   Aspirin    Social History:  The patient  reports that he has been smoking cigarettes. He has never used smokeless tobacco. He reports that he does not drink alcohol and does not use drugs.   Family History:  The patient's family history includes Heart attack (age of onset: 43) in his mother.    ROS:  Please see the history of present illness. Otherwise, review of systems are positive for none.   All other systems are reviewed and negative.  PHYSICAL EXAM: VS:  BP 130/78    Pulse 79    Ht 5\' 10"  (1.778 m)    Wt 252 lb 3.2 oz (114.4 kg)    SpO2 98%    BMI 36.19 kg/m  , BMI Body mass index is 36.19 kg/m.   GEN: Well nourished, well developed in no acute distress HEENT: Normal NECK: No JVD; No carotid bruits LYMPHATICS: No lymphadenopathy CARDIAC:RRR, no murmurs, rubs, gallops RESPIRATORY:  Clear to auscultation without rales, wheezing or rhonchi  ABDOMEN: Soft, non-tender, non-distended MUSCULOSKELETAL:  No edema; No deformity  SKIN: Warm and dry NEUROLOGIC:  Alert and oriented x 3 PSYCHIATRIC:  Normal affect    EKG:  EKG is not ordered today   Recent Labs: No results found for requested labs within last 8760 hours.    Lipid Panel    Component Value Date/Time   CHOL 92 (L) 01/12/2021 1139   TRIG 73 01/12/2021 1139   HDL 32 (L) 01/12/2021 1139   CHOLHDL 2.9 01/12/2021 1139   LDLCALC 44 01/12/2021 1139      Wt Readings from Last 3 Encounters:  02/24/22 252 lb 3.2 oz (114.4 kg)  01/13/22 251 lb (113.9 kg)  08/08/21 263 lb 3.2 oz (119.4 kg)    Other studies Reviewed: Additional studies/ records that were reviewed today include:  Review of the above records demonstrates:    Cardiac Catheterization 10/05/2020:   CORONARY STENT INTERVENTION  INTRAVASCULAR PRESSURE WIRE/FFR STUDY   Intravascular Ultrasound/IVUS  LEFT HEART CATH AND CORONARY ANGIOGRAPHY  Conclusion     Mid RCA lesion is 100% stenosed. Chronic total occlusion. Right to right and left to right collaterals. Mid LAD lesion is 70% stenosed. DFR 0.85. A drug-eluting stent was successfully placed using a SYNERGY XD 3.0X28. Post intervention, there is a 0% residual stenosis. 2nd Diag lesion is 70% stenosed. This is a small branch vessel, too small for stent. There is moderate left ventricular systolic dysfunction. The left ventricular ejection fraction is 35-45% by visual estimate. LV end diastolic pressure is normal. There is no aortic valve stenosis.   Continue aggressive secondary prevention.  Consider clopidogrel monotherapy after 6 months given other CAD.   Medical therapy for LV dysfunction.  EF should improve with this revascularization since flow to anterior wall and collaterals to RCA has improved.    Diagnostic Dominance: Right  Intervention     ASSESSMENT AND PLAN:  1. CAD s/p PCI to mLAD: -LHC performed 10/05/2020 which showed a chronic total occlusion or mRCA with right to right and left to right collaterals. Mid LAD lesion is 70% stenosed (DFR 0.85) s/p DES placement.  There was a 70% 2nd diag, vessel small for stent.  -He was seen by Dr. Haroldine Laws back in January 2023 was complaining of intermittent chest pain and also significant DOE. -I will get a Lexiscan Myoview to rule out ischemia -Shared Decision Making/Informed Consent The risks [chest pain, shortness of breath, cardiac arrhythmias, dizziness, blood pressure fluctuations, myocardial infarction, stroke/transient ischemic attack, nausea, vomiting, allergic reaction, radiation exposure, metallic taste sensation and life-threatening complications (estimated to be 1 in 10,000)], benefits (risk stratification, diagnosing coronary artery disease, treatment guidance) and alternatives of a nuclear stress test were discussed in detail  with Mr. Neier and he agrees to proceed. -Continue prescription drug management with aspirin 81 mg daily, Plavix 75 mg daily, high-dose statin -no BB due to orthostatic hypotension  2. HLD: -LDL goal < 70 -Check FLP and ALT -Continue prescription drug management atorvastatin 80 mg  daily and Zetia 10 mg daily with as needed refills  3. HTN: -BP is adequately controlled on exam today. -He is off ARB due to dizziness  4.  Syncope/NSVT/Orthostatic Hypotension/dizziness -he has had several episdoes of syncope prior to his heart cath. The first episode occurred while standing up emptying some buckets.  The other episode occurred when he was sitting in a chair and just fell out with no prodrome. -event monitor showed episodes of NSVT but no bradyarrhythmias -He has had an extensive evaluation by neurology with no revealing etiology. -Losartan was recently stopped to allow higher blood pressure and compression hose were added and he was continued on midodrine 5 mg 3 times daily  5.  Chronic Systolic Heart Failure -CMRI 11/2020 -LVEF is severely reduced at 30% and there is significant burden of transmural myocardial scarring (poor chance of recovery) in 7 segments (~ 40% of left ventricular myocardium).  - 12/2020 [employer] ICD placed. - Echo 07/2021 - EF 30-35% , HK LV, RV normal - NYHA III predominantly related to dizziness - No ACE/ARB/beta-blocker due to orthostatic hypotension and dizziness - Continue Jardiance 10  - Unable to initiate additional GDMT with need for midodrine and profound dizziness.  - He is not candidate for advanced therapies. - He has been released from advanced heart failure clinic due to no other options available for treatment of his heart failure  Followup with me in 6 months  Signed, Fransico Him, MD  02/24/2022 11:56 AM    [facility] [place], [place],   [address] Phone: [phone]; Fax: [phone]

## 2022-02-24 NOTE — Addendum Note (Signed)
Addended by: Armanda Magic R on: 02/24/2022 12:44 PM ? ? Modules accepted: Orders ? ?

## 2022-02-24 NOTE — Patient Instructions (Addendum)
Medication Instructions:  ?Your physician recommends that you continue on your current medications as directed. Please refer to the Current Medication list given to you today. ? ?*If you need a refill on your cardiac medications before your next appointment, please call your pharmacy* ? ? ?Lab Work: ?TODAY: FLP and ALT ?If you have labs (blood work) drawn today and your tests are completely normal, you will receive your results only by: ?MyChart Message (if you have MyChart) OR ?A paper copy in the mail ?If you have any lab test that is abnormal or we need to change your treatment, we will call you to review the results. ? ? ?Testing/Procedures: ?Your physician has requested that you have a lexiscan myoview. For further information please visit [email or web address] Please follow instruction sheet, as given. ? ?Follow-Up: ?At [facility], you and your health needs are our priority.  As part of our continuing mission to provide you with exceptional heart care, we have created designated Provider Care Teams.  These Care Teams include your primary Cardiologist (physician) and Advanced Practice Providers (APPs -  Physician Assistants and Nurse Practitioners) who all work together to provide you with the care you need, when you need it. ? ?Your next appointment:   ?6 month(s) ? ?The format for your next appointment:   ?In Person ? ?Provider:   ?Armanda Magic, MD   ? ? ?  ?

## 2022-02-25 LAB — LIPID PANEL
Chol/HDL Ratio: 2.9 ratio (ref 0.0–5.0)
Cholesterol, Total: 109 mg/dL (ref 100–199)
HDL: 38 mg/dL — ABNORMAL LOW (ref >39)
LDL Chol Calc (NIH): 53 mg/dL (ref 0–99)
Triglycerides: 92 mg/dL (ref 0–149)
VLDL Cholesterol Cal: 18 mg/dL (ref 5–40)

## 2022-02-25 LAB — ALT: ALT: 29 IU/L (ref 0–44)

## 2022-02-27 ENCOUNTER — Telehealth (HOSPITAL_COMMUNITY): Payer: Self-pay | Admitting: *Deleted

## 2022-02-27 NOTE — Telephone Encounter (Signed)
Patient' wife given detailed instructions per Myocardial Perfusion Study Information Sheet for the test on 03/06/2022 at :10:45. Patient notified to arrive 15 minutes early and that it is imperative to arrive on time for appointment to keep from having the test rescheduled. ? If you need to cancel or reschedule your appointment, please call the office within 24 hours of your appointment. . Patient verbalized understanding.Glade Lloyd S ? ? ?

## 2022-03-02 IMAGING — DX DG CHEST 1V PORT
1 series · 1 of 1 positions shown · non-contrast
Comparison: None.

CLINICAL DATA: Shortness of breath. No known COVID exposure.
Current smoker.

EXAM:
PORTABLE CHEST 1 VIEW

[chest ap]
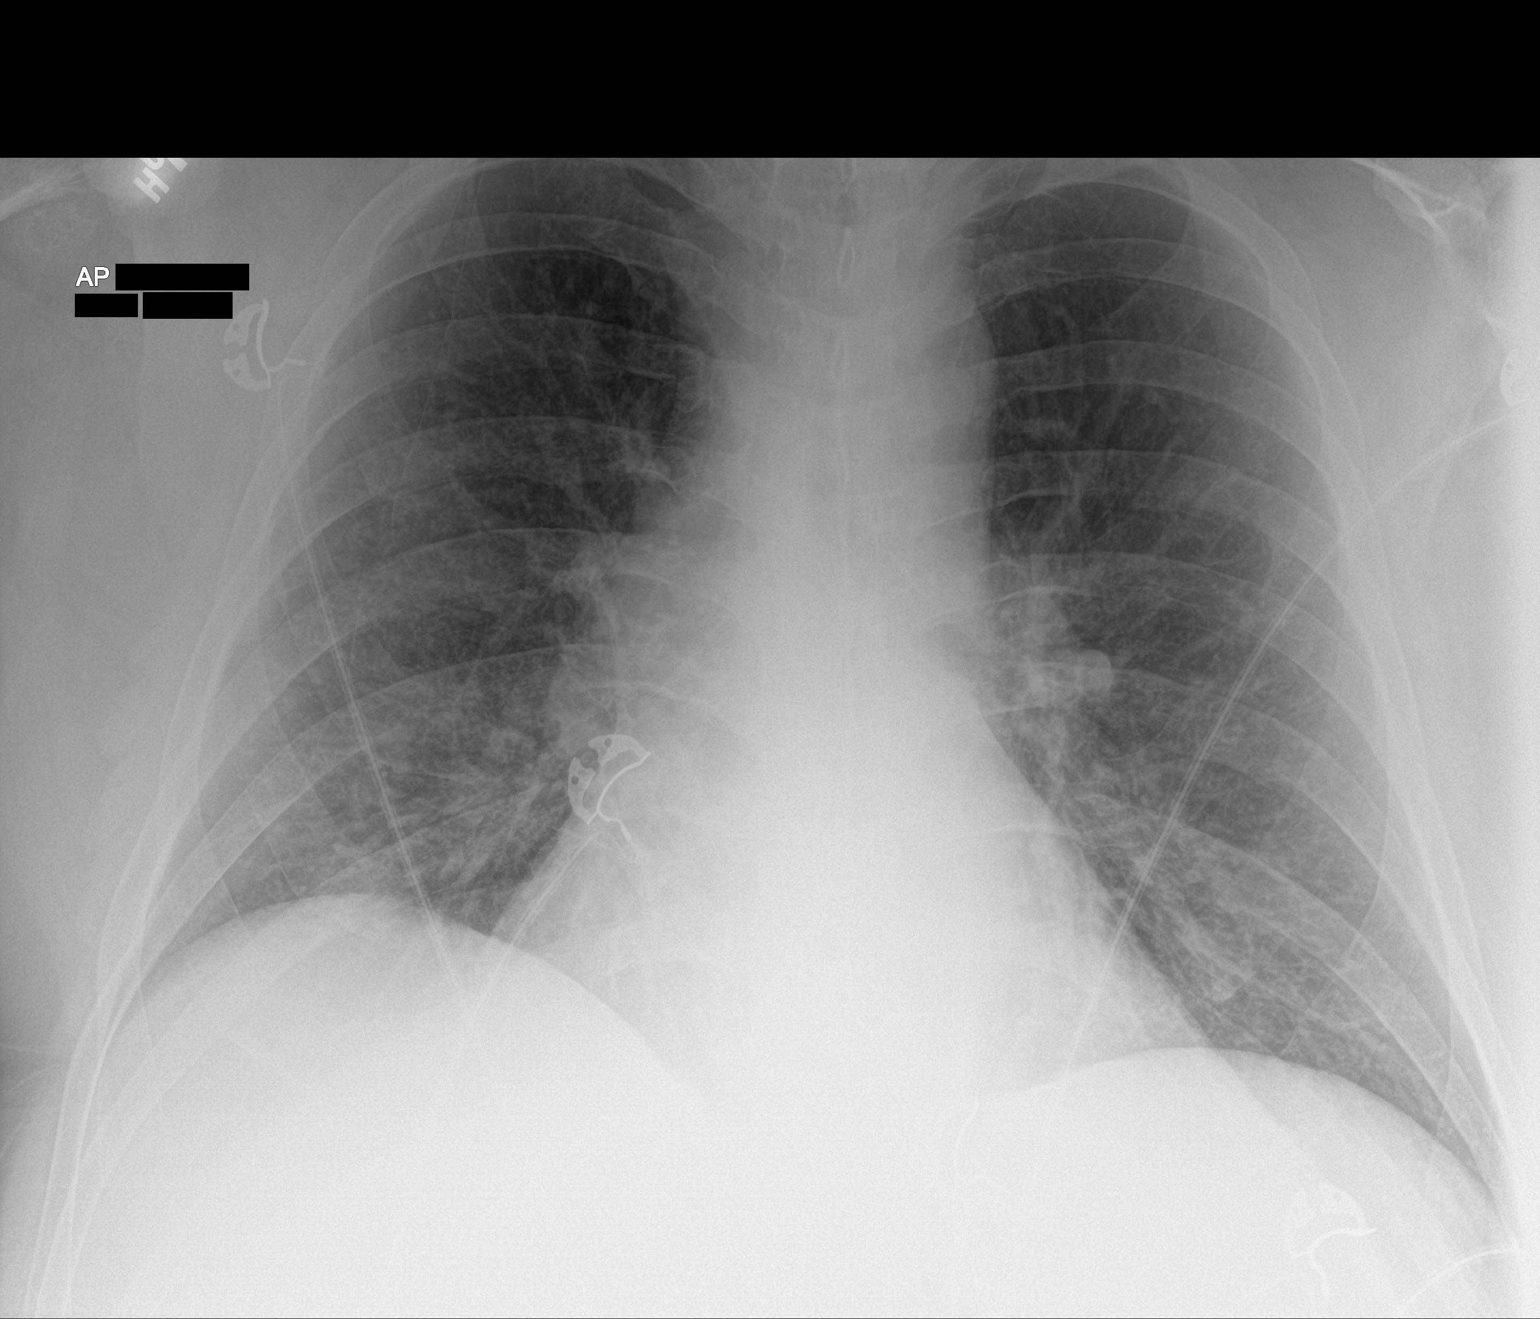

[1 of 1 positions shown; findings below may reference images not displayed]

FINDINGS: Shallow inspiration. Cardiac enlargement. No vascular congestion or
edema. No focal consolidation or airspace disease. No pleural
effusions. No pneumothorax. Mediastinal contours appear intact.
Postoperative changes in the cervical spine.
IMPRESSION: Cardiac enlargement. No evidence of active pulmonary disease.

## 2022-03-03 IMAGING — CT CT ANGIO CHEST
2 of 6 series · 18 of 46 positions shown · IV contrast (OMNIPAQUE)
Comparison: Chest x-ray 09/12/2020

CLINICAL DATA: Syncope, elevated D-dimer

EXAM:
CT ANGIOGRAPHY CHEST WITH CONTRAST
TECHNIQUE: Multidetector CT imaging of the chest was performed using the
standard protocol during bolus administration of intravenous
contrast. Multiplanar CT image reconstructions and MIPs were
obtained to evaluate the vascular anatomy.
CONTRAST:  100mL OMNIPAQUE IOHEXOL 350 MG/ML SOLN

[Series 5: 3 thins · axial · 0.85mm/px · z∈[-199,+115]mm · 16 of 344 slices shown]
[im 15/344  lung]
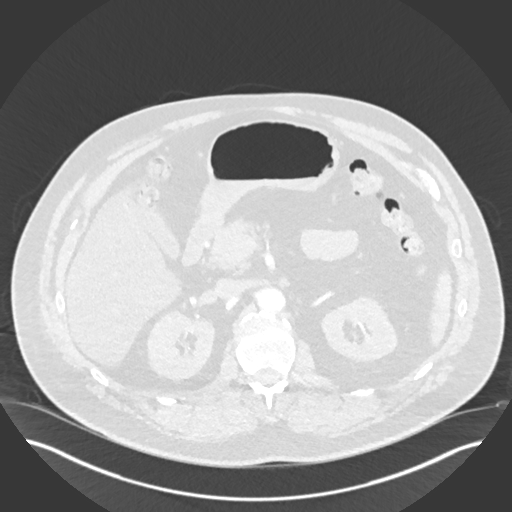
[im 45/344  soft-tissue]
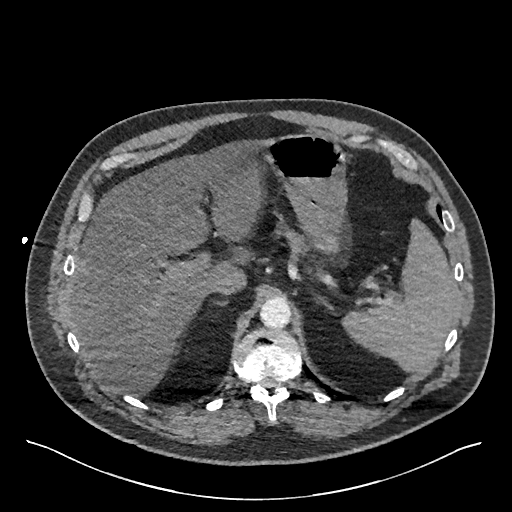
[im 60/344  lung]
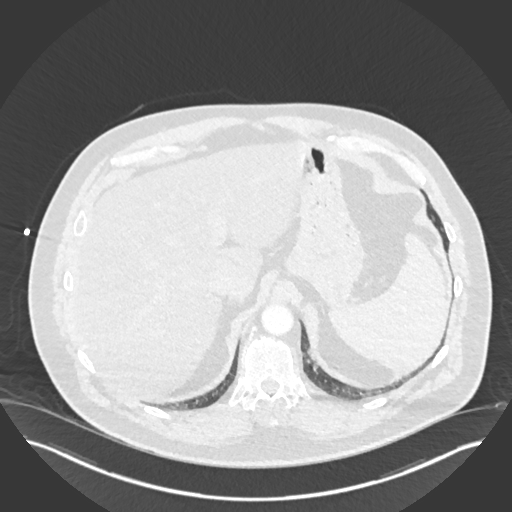
[im 75/344  soft-tissue]
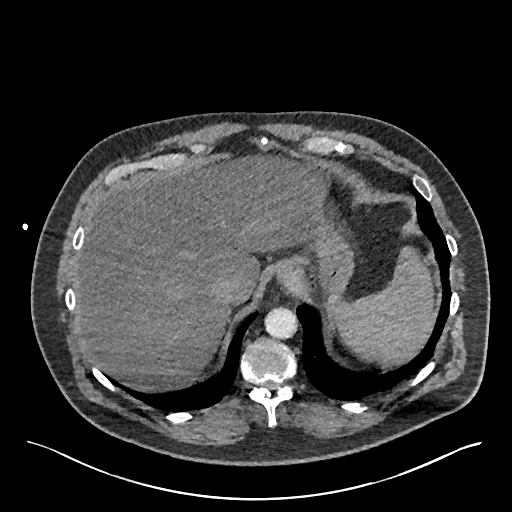
[im 105/344  lung]
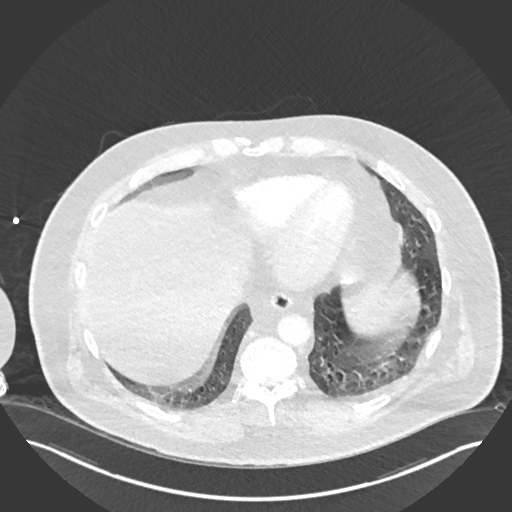
[im 120/344  soft-tissue]
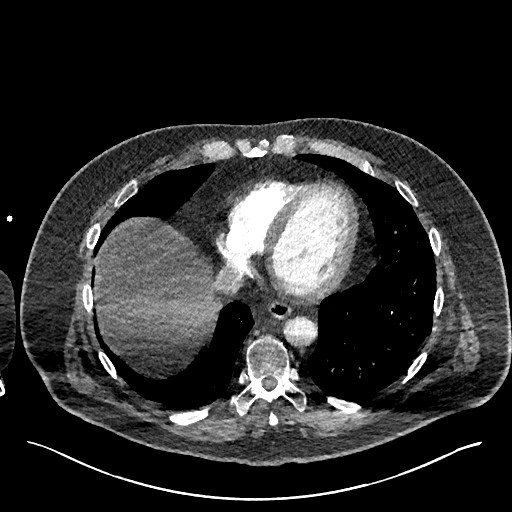
[im 135/344  lung]
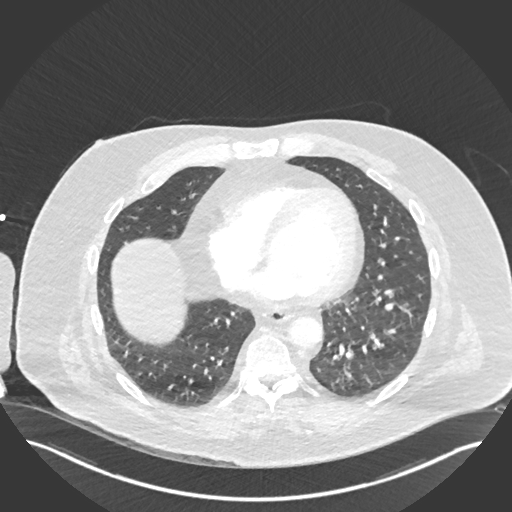
[im 165/344  soft-tissue]
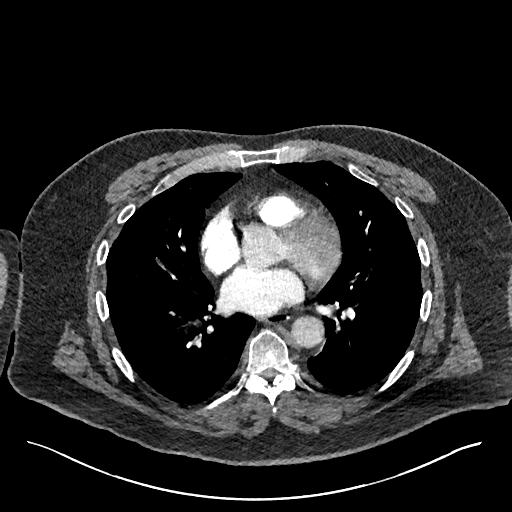
[im 179/344  lung]
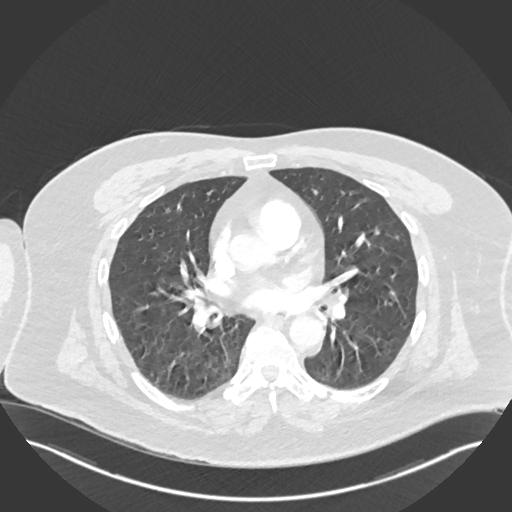
[im 209/344  soft-tissue]
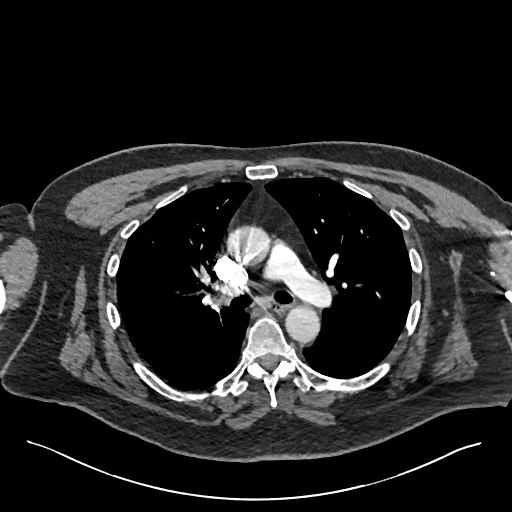
[im 224/344  lung]
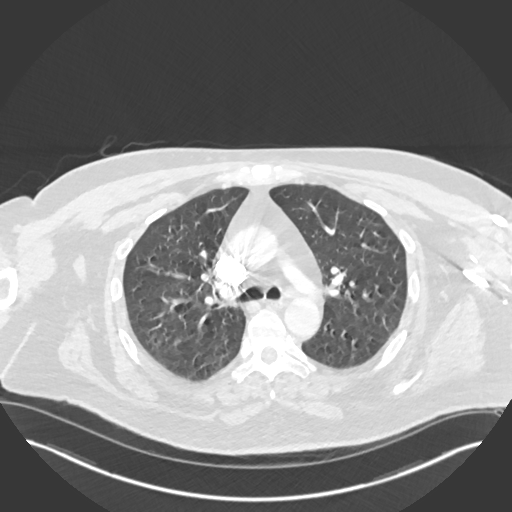
[im 239/344  soft-tissue]
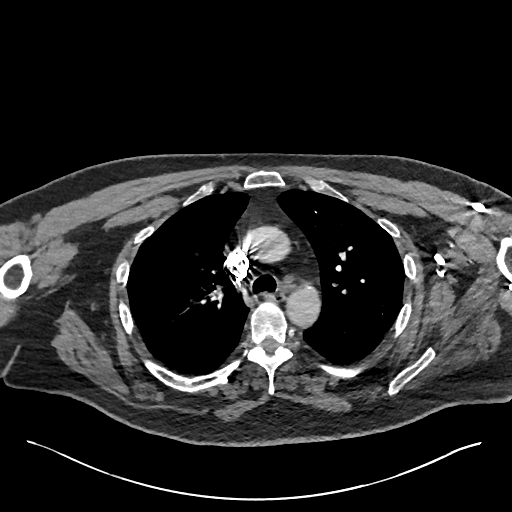
[im 269/344  lung]
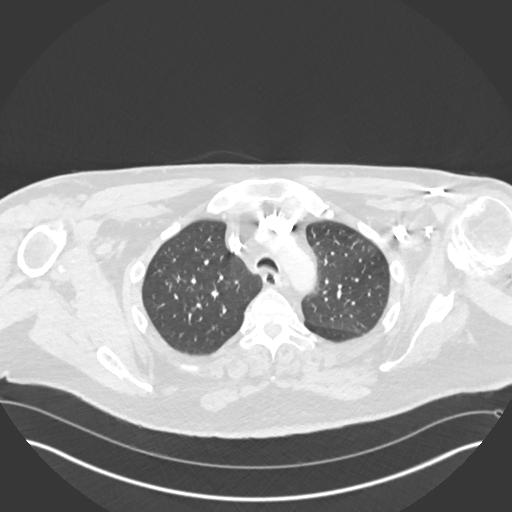
[im 284/344  soft-tissue]
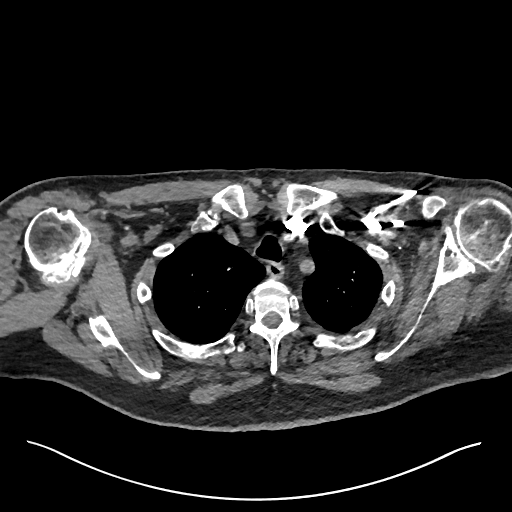
[im 299/344  lung]
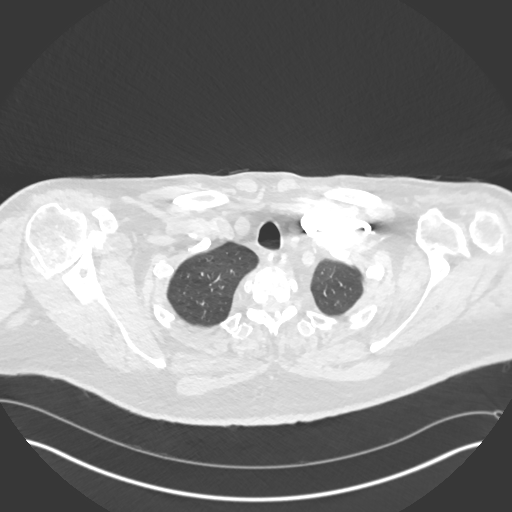
[im 329/344  soft-tissue]
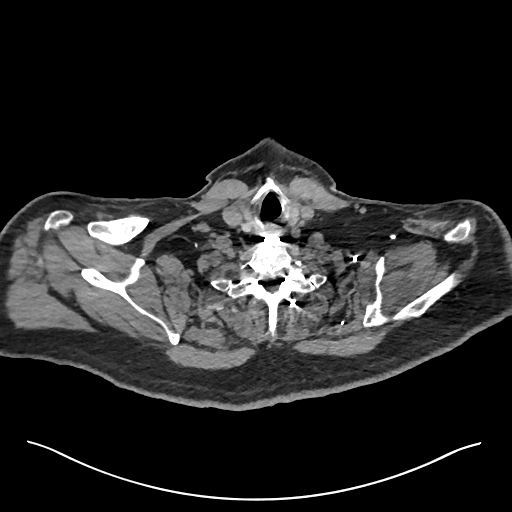

[Series 6: coronal mpr · coronal · 0.77mm/px · 2 of 94 slices shown]
[im 32/94  soft-tissue]
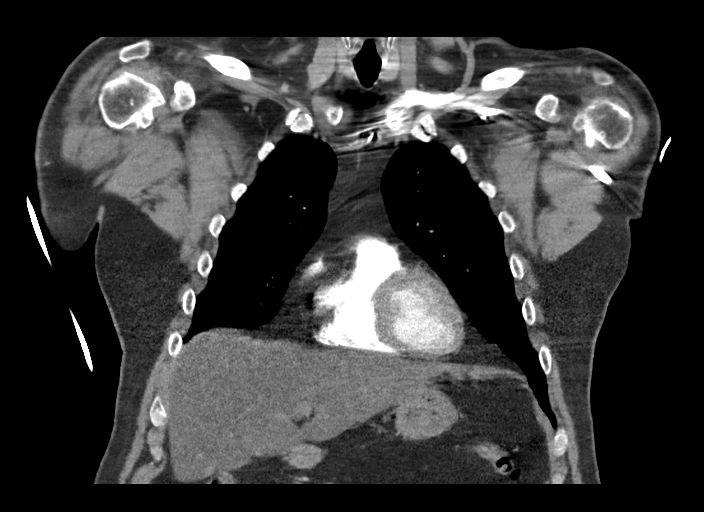
[im 63/94  soft-tissue]
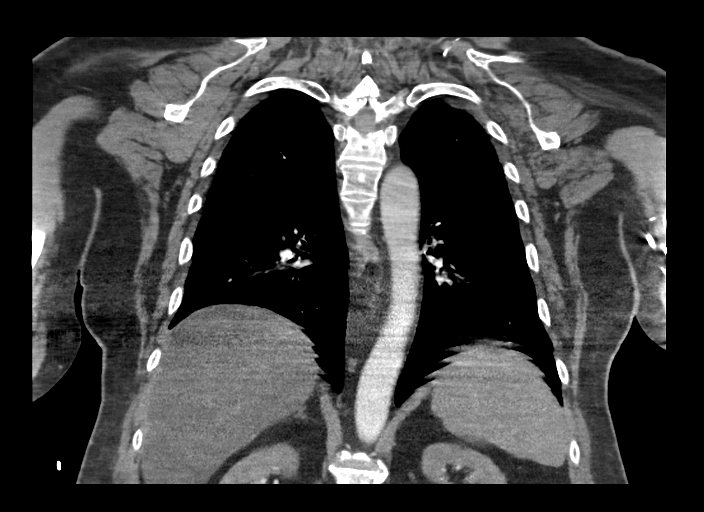

[18 of 46 positions shown; findings below may reference images not displayed]

FINDINGS: Cardiovascular: No filling defects in the pulmonary arteries to
suggest pulmonary emboli. Heart is normal size. Scattered coronary
artery calcifications. Aorta normal caliber.

Mediastinum/Nodes: No mediastinal, hilar, or axillary adenopathy.
Trachea and esophagus are unremarkable. Thyroid unremarkable.

Lungs/Pleura: Lungs are clear. No focal airspace opacities or
suspicious nodules. No effusions.

Upper Abdomen: Diffuse fatty infiltration of the liver.

Musculoskeletal: Chest wall soft tissues are unremarkable. No acute
bony abnormality.

Review of the MIP images confirms the above findings.
IMPRESSION: No evidence of pulmonary embolus.

Coronary artery disease.

Diffuse fatty infiltration of the liver.

No acute cardiopulmonary disease.

## 2022-03-06 ENCOUNTER — Other Ambulatory Visit: Payer: Self-pay

## 2022-03-06 ENCOUNTER — Ambulatory Visit (HOSPITAL_COMMUNITY): Payer: Medicare HMO | Attending: Cardiology

## 2022-03-06 DIAGNOSIS — R072 Precordial pain: Secondary | ICD-10-CM | POA: Insufficient documentation

## 2022-03-06 LAB — MYOCARDIAL PERFUSION IMAGING
Estimated workload: 87
LV dias vol: 166 mL (ref 62–150)
LV sys vol: 124 mL
Nuc Stress EF: 26 %
Peak HR: 1 {beats}/min
Rest HR: 65 {beats}/min
Rest Nuclear Isotope Dose: 10.1 mCi
SDS: 4
SRS: 12
SSS: 16
ST Depression (mm): 0 mm
Stress Nuclear Isotope Dose: 32.4 mCi
TID: 1.1

## 2022-03-06 IMAGING — DX DG CHEST 2V
2 series · 2 of 2 positions shown · non-contrast
Comparison: 09/12/2020

CLINICAL DATA: Syncope

EXAM:
CHEST - 2 VIEW

[chest ap]
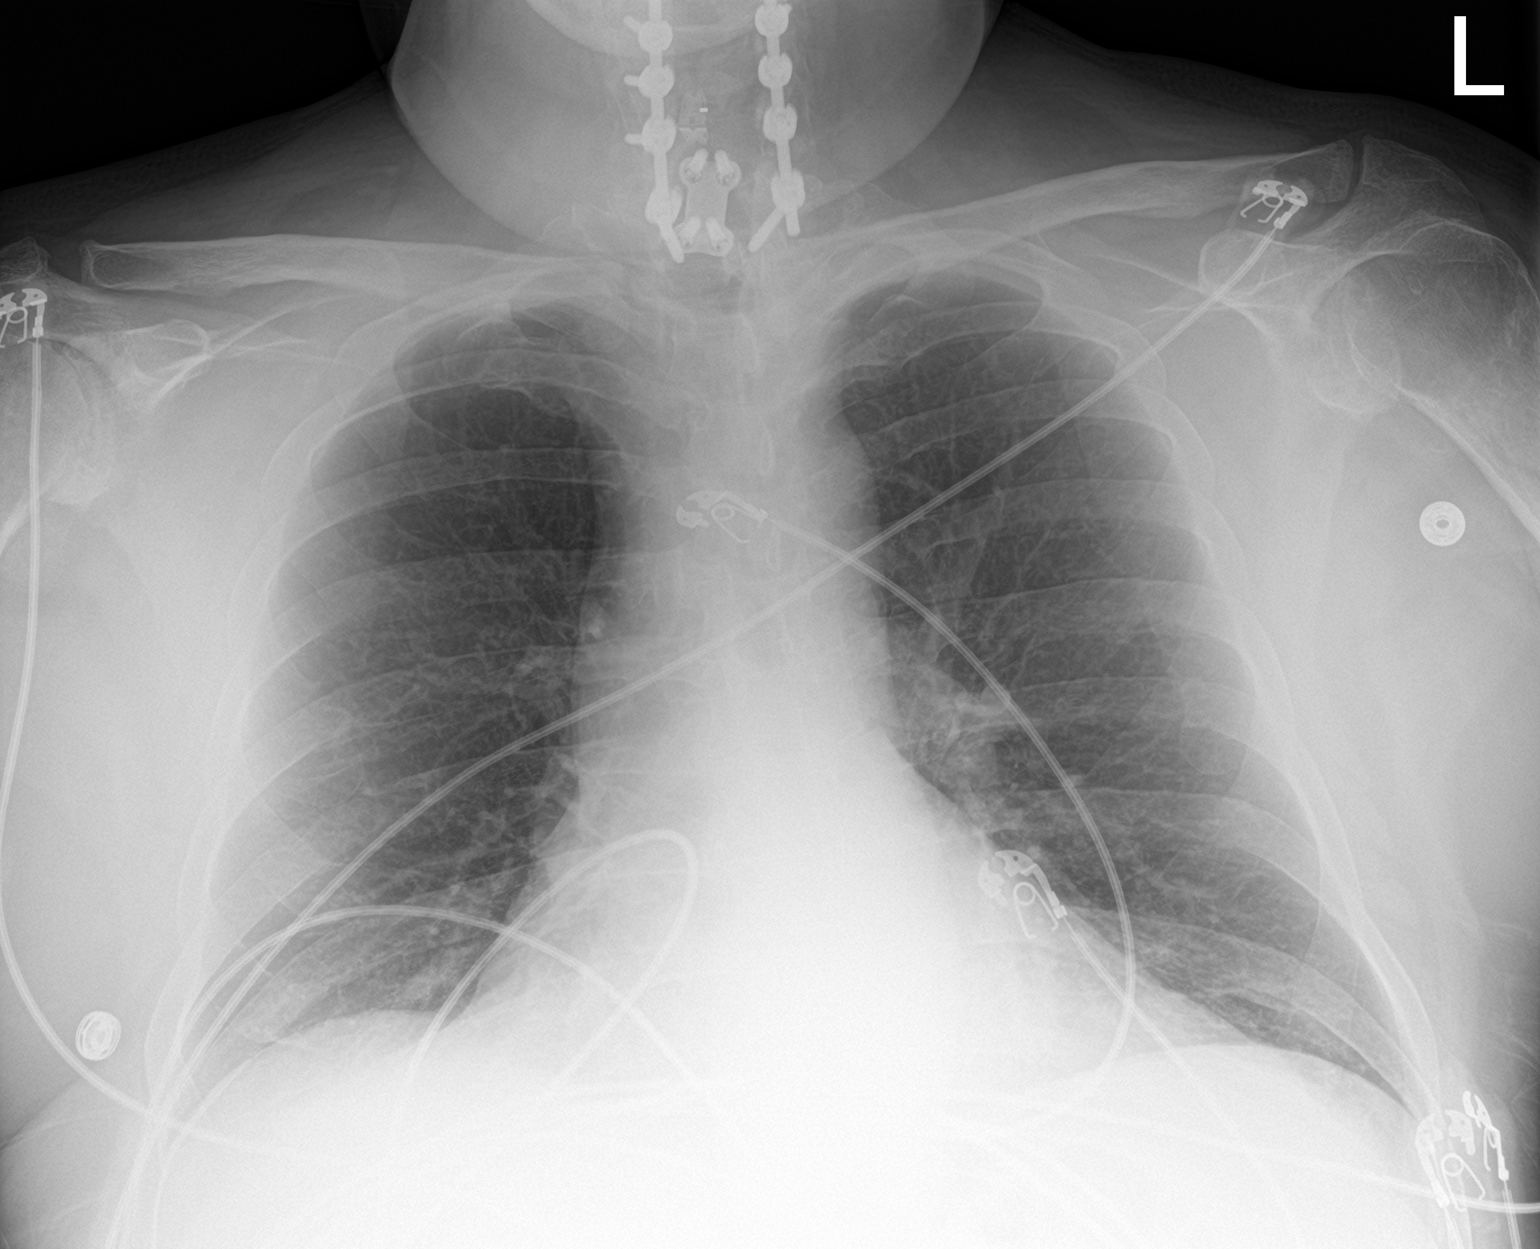

[chest lat]
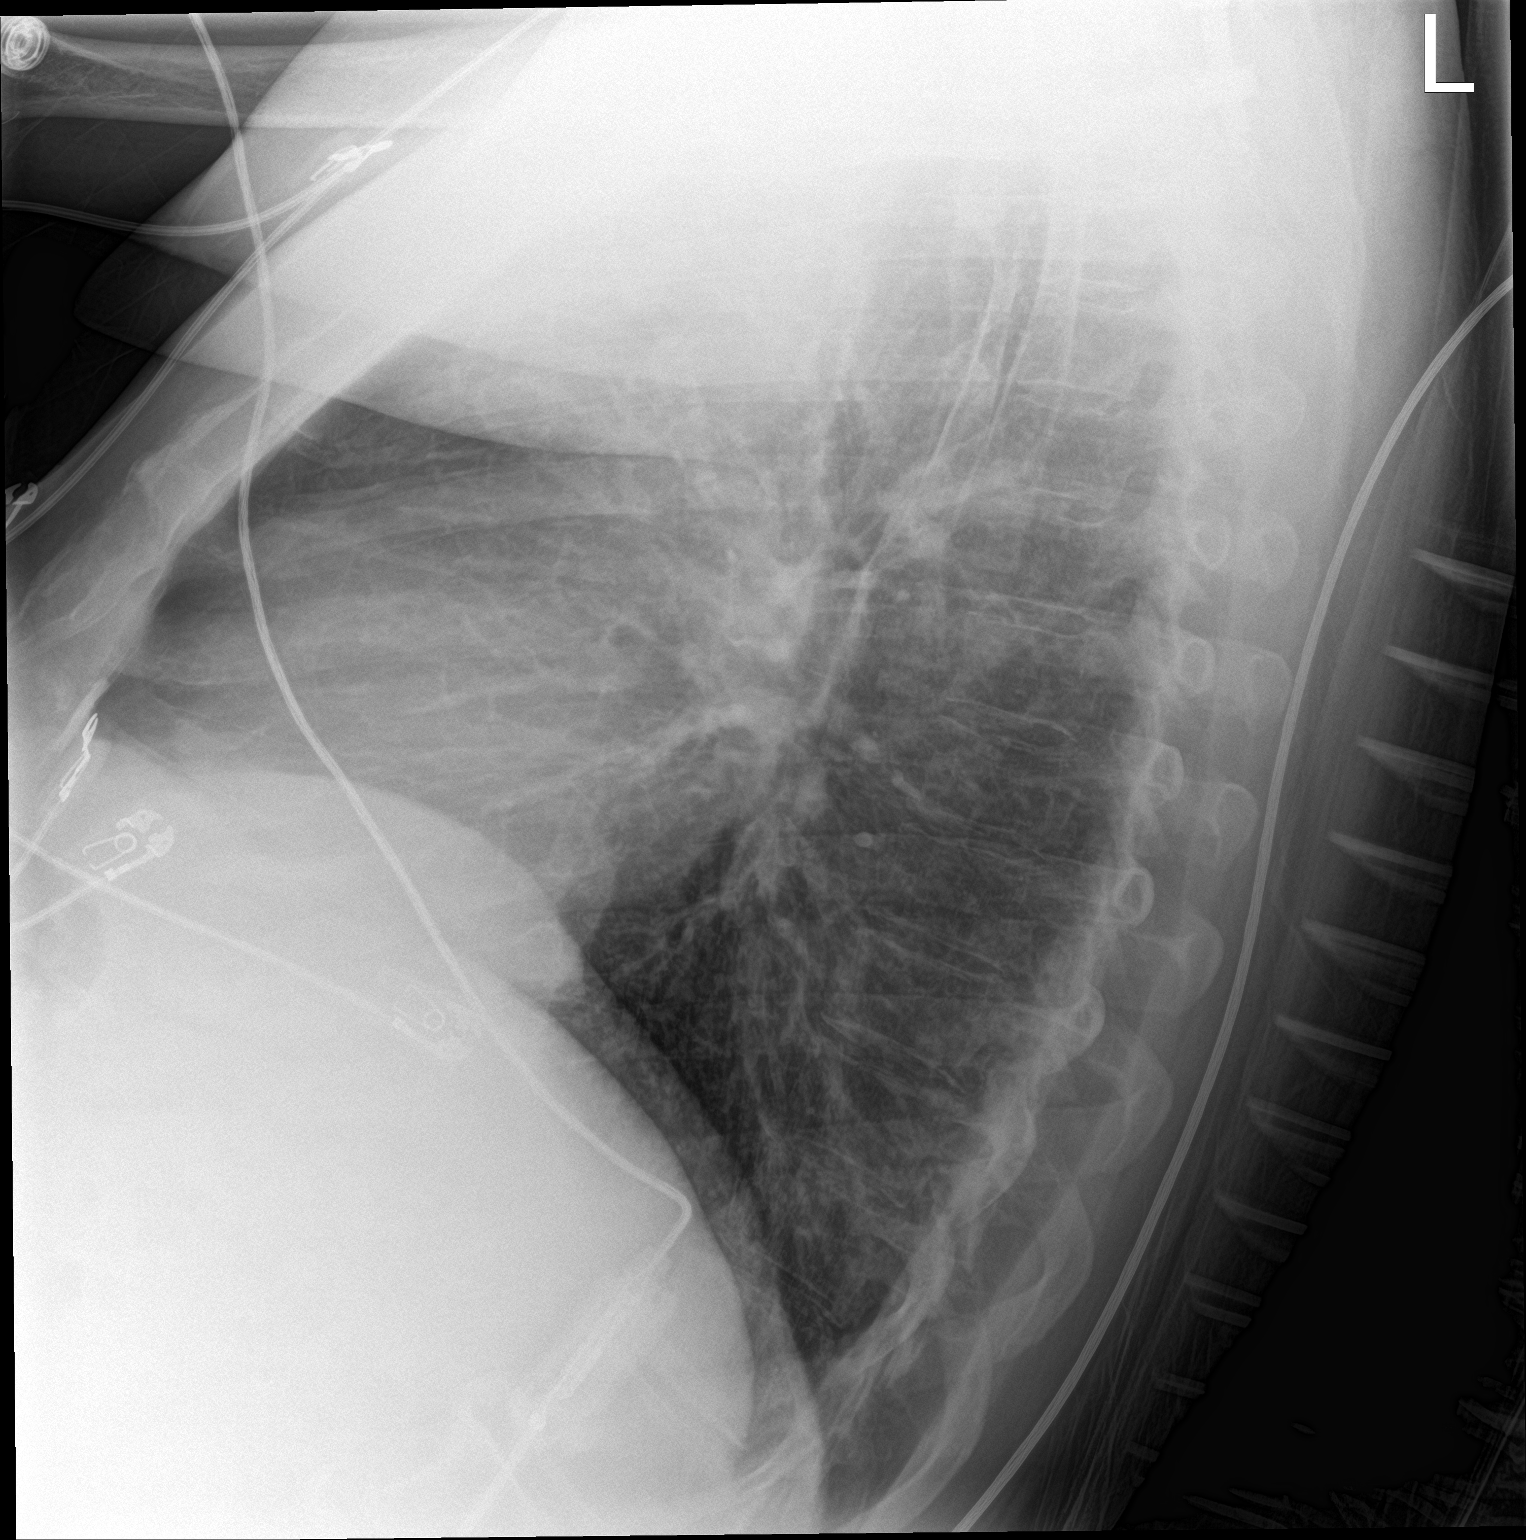

[2 of 2 positions shown; findings below may reference images not displayed]

FINDINGS: Surgical hardware in the cervical spine. No focal opacity or pleural
effusion. Stable enlarged cardiomediastinal silhouette. No
pneumothorax.
IMPRESSION: No active cardiopulmonary disease. Cardiomegaly.

## 2022-03-06 MED ORDER — TECHNETIUM TC 99M TETROFOSMIN IV KIT
10.1000 | PACK | Freq: Once | INTRAVENOUS | Status: AC | PRN
  Administered 2022-03-06: 10.1 via INTRAVENOUS
  Filled 2022-03-06: qty 11

## 2022-03-06 MED ORDER — TECHNETIUM TC 99M TETROFOSMIN IV KIT
32.4000 | PACK | Freq: Once | INTRAVENOUS | Status: AC | PRN
Start: 2022-03-06 — End: 2022-03-06
  Administered 2022-03-06: 32.4 via INTRAVENOUS
  Filled 2022-03-06: qty 33

## 2022-03-06 MED ORDER — REGADENOSON 0.4 MG/5ML IV SOLN
0.4000 mg | Freq: Once | INTRAVENOUS | Status: AC
  Administered 2022-03-06: 0.4 mg via INTRAVENOUS

## 2022-03-07 ENCOUNTER — Telehealth: Payer: Self-pay

## 2022-03-07 DIAGNOSIS — I5022 Chronic systolic (congestive) heart failure: Secondary | ICD-10-CM

## 2022-03-07 NOTE — Telephone Encounter (Signed)
-----   Message from Sueanne Margarita, MD sent at 03/06/2022  8:06 PM EDT ----- ?No ischemia but large inferior infarct due to occluded RCA with L>R collaterals at last cath.  Please get a repeat echo to see if LVF has changed given reduced EF on stress test ?

## 2022-03-07 NOTE — Telephone Encounter (Signed)
The patient's wife has been notified of the result and verbalized understanding.  All questions (if any) were answered. ?Theresia Majors, RN 03/07/2022 10:33 AM  ?Echocardiogram has been ordered ?

## 2022-03-20 ENCOUNTER — Ambulatory Visit (HOSPITAL_COMMUNITY): Payer: Medicare HMO | Attending: Cardiology

## 2022-03-20 DIAGNOSIS — I5022 Chronic systolic (congestive) heart failure: Secondary | ICD-10-CM | POA: Diagnosis not present

## 2022-03-20 LAB — ECHOCARDIOGRAM COMPLETE
Area-P 1/2: 2.99 cm2
S' Lateral: 5.2 cm

## 2022-03-20 MED ORDER — PERFLUTREN LIPID MICROSPHERE
1.0000 mL | INTRAVENOUS | Status: AC | PRN
  Administered 2022-03-20: 1 mL via INTRAVENOUS

## 2022-04-09 ENCOUNTER — Other Ambulatory Visit: Payer: Self-pay

## 2022-04-09 ENCOUNTER — Inpatient Hospital Stay (HOSPITAL_COMMUNITY)
Admission: EM | Admit: 2022-04-09 | Discharge: 2022-04-13 | DRG: 871 | Disposition: A | Discharge status: Home / Self Care | Payer: Medicare HMO | Attending: Family Medicine | Admitting: Family Medicine

## 2022-04-09 ENCOUNTER — Encounter (HOSPITAL_COMMUNITY): Payer: Self-pay

## 2022-04-09 ENCOUNTER — Emergency Department (HOSPITAL_COMMUNITY): Payer: Medicare HMO

## 2022-04-09 DIAGNOSIS — K219 Gastro-esophageal reflux disease without esophagitis: Secondary | ICD-10-CM | POA: Diagnosis present

## 2022-04-09 DIAGNOSIS — G35 Multiple sclerosis: Secondary | ICD-10-CM | POA: Diagnosis present

## 2022-04-09 DIAGNOSIS — I255 Ischemic cardiomyopathy: Secondary | ICD-10-CM | POA: Diagnosis present

## 2022-04-09 DIAGNOSIS — F17210 Nicotine dependence, cigarettes, uncomplicated: Secondary | ICD-10-CM | POA: Diagnosis present

## 2022-04-09 DIAGNOSIS — E782 Mixed hyperlipidemia: Secondary | ICD-10-CM | POA: Diagnosis present

## 2022-04-09 DIAGNOSIS — J440 Chronic obstructive pulmonary disease with acute lower respiratory infection: Secondary | ICD-10-CM | POA: Diagnosis present

## 2022-04-09 DIAGNOSIS — I110 Hypertensive heart disease with heart failure: Secondary | ICD-10-CM | POA: Diagnosis present

## 2022-04-09 DIAGNOSIS — Z8249 Family history of ischemic heart disease and other diseases of the circulatory system: Secondary | ICD-10-CM

## 2022-04-09 DIAGNOSIS — E1142 Type 2 diabetes mellitus with diabetic polyneuropathy: Secondary | ICD-10-CM | POA: Diagnosis present

## 2022-04-09 DIAGNOSIS — D696 Thrombocytopenia, unspecified: Secondary | ICD-10-CM | POA: Diagnosis present

## 2022-04-09 DIAGNOSIS — R0602 Shortness of breath: Secondary | ICD-10-CM | POA: Diagnosis present

## 2022-04-09 DIAGNOSIS — A419 Sepsis, unspecified organism: Principal | ICD-10-CM | POA: Diagnosis present

## 2022-04-09 DIAGNOSIS — Z20822 Contact with and (suspected) exposure to covid-19: Secondary | ICD-10-CM | POA: Diagnosis present

## 2022-04-09 DIAGNOSIS — R051 Acute cough: Secondary | ICD-10-CM

## 2022-04-09 DIAGNOSIS — Z79890 Hormone replacement therapy (postmenopausal): Secondary | ICD-10-CM

## 2022-04-09 DIAGNOSIS — Z7982 Long term (current) use of aspirin: Secondary | ICD-10-CM

## 2022-04-09 DIAGNOSIS — I951 Orthostatic hypotension: Secondary | ICD-10-CM | POA: Diagnosis present

## 2022-04-09 DIAGNOSIS — Z79899 Other long term (current) drug therapy: Secondary | ICD-10-CM

## 2022-04-09 DIAGNOSIS — G4733 Obstructive sleep apnea (adult) (pediatric): Secondary | ICD-10-CM | POA: Diagnosis present

## 2022-04-09 DIAGNOSIS — J9601 Acute respiratory failure with hypoxia: Secondary | ICD-10-CM | POA: Diagnosis present

## 2022-04-09 DIAGNOSIS — E039 Hypothyroidism, unspecified: Secondary | ICD-10-CM | POA: Diagnosis present

## 2022-04-09 DIAGNOSIS — J441 Chronic obstructive pulmonary disease with (acute) exacerbation: Secondary | ICD-10-CM | POA: Diagnosis present

## 2022-04-09 DIAGNOSIS — J189 Pneumonia, unspecified organism: Secondary | ICD-10-CM | POA: Diagnosis present

## 2022-04-09 DIAGNOSIS — R6520 Severe sepsis without septic shock: Secondary | ICD-10-CM | POA: Diagnosis present

## 2022-04-09 DIAGNOSIS — R509 Fever, unspecified: Principal | ICD-10-CM

## 2022-04-09 DIAGNOSIS — Z95810 Presence of automatic (implantable) cardiac defibrillator: Secondary | ICD-10-CM

## 2022-04-09 DIAGNOSIS — I5022 Chronic systolic (congestive) heart failure: Secondary | ICD-10-CM | POA: Diagnosis present

## 2022-04-09 DIAGNOSIS — Z7902 Long term (current) use of antithrombotics/antiplatelets: Secondary | ICD-10-CM

## 2022-04-09 DIAGNOSIS — Z7984 Long term (current) use of oral hypoglycemic drugs: Secondary | ICD-10-CM

## 2022-04-09 DIAGNOSIS — Z9989 Dependence on other enabling machines and devices: Secondary | ICD-10-CM

## 2022-04-09 DIAGNOSIS — E872 Acidosis, unspecified: Secondary | ICD-10-CM | POA: Diagnosis present

## 2022-04-09 DIAGNOSIS — Z886 Allergy status to analgesic agent status: Secondary | ICD-10-CM | POA: Diagnosis not present

## 2022-04-09 DIAGNOSIS — Z955 Presence of coronary angioplasty implant and graft: Secondary | ICD-10-CM

## 2022-04-09 DIAGNOSIS — F329 Major depressive disorder, single episode, unspecified: Secondary | ICD-10-CM | POA: Diagnosis present

## 2022-04-09 DIAGNOSIS — E869 Volume depletion, unspecified: Secondary | ICD-10-CM | POA: Diagnosis present

## 2022-04-09 DIAGNOSIS — I2510 Atherosclerotic heart disease of native coronary artery without angina pectoris: Secondary | ICD-10-CM | POA: Diagnosis present

## 2022-04-09 HISTORY — DX: Atherosclerotic heart disease of native coronary artery without angina pectoris: I25.10

## 2022-04-09 HISTORY — DX: Chronic systolic (congestive) heart failure: I50.22

## 2022-04-09 LAB — CBC WITH DIFFERENTIAL/PLATELET
Abs Immature Granulocytes: 0.08 10*3/uL — ABNORMAL HIGH (ref 0.00–0.07)
Basophils Absolute: 0.1 10*3/uL (ref 0.0–0.1)
Basophils Relative: 1 %
Eosinophils Absolute: 0 10*3/uL (ref 0.0–0.5)
Eosinophils Relative: 0 %
HCT: 50.2 % (ref 39.0–52.0)
Hemoglobin: 16.3 g/dL (ref 13.0–17.0)
Immature Granulocytes: 1 %
Lymphocytes Relative: 4 %
Lymphs Abs: 0.3 10*3/uL — ABNORMAL LOW (ref 0.7–4.0)
MCH: 29.3 pg (ref 26.0–34.0)
MCHC: 32.5 g/dL (ref 30.0–36.0)
MCV: 90.1 fL (ref 80.0–100.0)
Monocytes Absolute: 1 10*3/uL (ref 0.1–1.0)
Monocytes Relative: 13 %
Neutro Abs: 6.6 10*3/uL (ref 1.7–7.7)
Neutrophils Relative %: 81 %
Platelets: 134 10*3/uL — ABNORMAL LOW (ref 150–400)
RBC: 5.57 MIL/uL (ref 4.22–5.81)
RDW: 14.7 % (ref 11.5–15.5)
WBC: 8.1 10*3/uL (ref 4.0–10.5)
nRBC: 0 % (ref 0.0–0.2)

## 2022-04-09 LAB — COMPREHENSIVE METABOLIC PANEL
ALT: 23 U/L (ref 0–44)
AST: 20 U/L (ref 15–41)
Albumin: 4.4 g/dL (ref 3.5–5.0)
Alkaline Phosphatase: 72 U/L (ref 38–126)
Anion gap: 10 (ref 5–15)
BUN: 12 mg/dL (ref 6–20)
CO2: 23 mmol/L (ref 22–32)
Calcium: 8.8 mg/dL — ABNORMAL LOW (ref 8.9–10.3)
Chloride: 104 mmol/L (ref 98–111)
Creatinine, Ser: 1.28 mg/dL — ABNORMAL HIGH (ref 0.61–1.24)
GFR, Estimated: >60 mL/min (ref >60)
Glucose, Bld: 190 mg/dL — ABNORMAL HIGH (ref 70–99)
Potassium: 3.8 mmol/L (ref 3.5–5.1)
Sodium: 137 mmol/L (ref 135–145)
Total Bilirubin: 1.9 mg/dL — ABNORMAL HIGH (ref 0.3–1.2)
Total Protein: 8.1 g/dL (ref 6.5–8.1)

## 2022-04-09 LAB — TROPONIN I (HIGH SENSITIVITY)
Troponin I (High Sensitivity): 6 ng/L (ref <18)
Troponin I (High Sensitivity): 6 ng/L (ref <18)

## 2022-04-09 LAB — PROTIME-INR
INR: 1.1 (ref 0.8–1.2)
Prothrombin Time: 14.3 seconds (ref 11.4–15.2)

## 2022-04-09 LAB — RESP PANEL BY RT-PCR (FLU A&B, COVID) ARPGX2
Influenza A by PCR: NEGATIVE
Influenza B by PCR: NEGATIVE
SARS Coronavirus 2 by RT PCR: NEGATIVE

## 2022-04-09 LAB — BRAIN NATRIURETIC PEPTIDE: B Natriuretic Peptide: 66.5 pg/mL (ref 0.0–100.0)

## 2022-04-09 LAB — LACTIC ACID, PLASMA
Lactic Acid, Venous: 2.5 mmol/L (ref 0.5–1.9)
Lactic Acid, Venous: 1.8 mmol/L (ref 0.5–1.9)

## 2022-04-09 MED ORDER — METHOCARBAMOL 500 MG PO TABS
750.0000 mg | ORAL_TABLET | Freq: Three times a day (TID) | ORAL | Status: DC | PRN
  Administered 2022-04-11: 750 mg via ORAL
  Filled 2022-04-09: qty 2

## 2022-04-09 MED ORDER — CLOPIDOGREL BISULFATE 75 MG PO TABS
75.0000 mg | ORAL_TABLET | Freq: Every day | ORAL | Status: DC
  Administered 2022-04-10 – 2022-04-13 (×4): 75 mg via ORAL
  Filled 2022-04-09 (×4): qty 1

## 2022-04-09 MED ORDER — SODIUM CHLORIDE 0.9 % IV SOLN
INTRAVENOUS | Status: DC
Start: 2022-04-10 — End: 2022-04-10

## 2022-04-09 MED ORDER — EZETIMIBE 10 MG PO TABS
10.0000 mg | ORAL_TABLET | Freq: Every day | ORAL | Status: DC
Start: 2022-04-10 — End: 2022-04-13
  Administered 2022-04-10 – 2022-04-13 (×4): 10 mg via ORAL
  Filled 2022-04-09 (×4): qty 1

## 2022-04-09 MED ORDER — POLYETHYLENE GLYCOL 3350 17 G PO PACK: 17.0000 g | PACK | Freq: Every day | ORAL | Status: DC | PRN

## 2022-04-09 MED ORDER — MELATONIN 5 MG PO TABS
10.0000 mg | ORAL_TABLET | Freq: Every evening | ORAL | Status: DC | PRN
  Administered 2022-04-11: 10 mg via ORAL
  Filled 2022-04-09: qty 2

## 2022-04-09 MED ORDER — MIDODRINE HCL 5 MG PO TABS
5.0000 mg | ORAL_TABLET | Freq: Three times a day (TID) | ORAL | Status: DC
  Administered 2022-04-10 – 2022-04-13 (×11): 5 mg via ORAL
  Filled 2022-04-09 (×11): qty 1

## 2022-04-09 MED ORDER — LEVOTHYROXINE SODIUM 50 MCG PO TABS
50.0000 ug | ORAL_TABLET | Freq: Every day | ORAL | Status: DC
  Administered 2022-04-10 – 2022-04-13 (×4): 50 ug via ORAL
  Filled 2022-04-09 (×4): qty 1

## 2022-04-09 MED ORDER — SODIUM CHLORIDE 0.9 % IV SOLN
2.0000 g | Freq: Once | INTRAVENOUS | Status: AC
  Administered 2022-04-09: 2 g via INTRAVENOUS
  Filled 2022-04-09: qty 12.5

## 2022-04-09 MED ORDER — ACETAMINOPHEN 325 MG PO TABS
650.0000 mg | ORAL_TABLET | Freq: Four times a day (QID) | ORAL | Status: DC | PRN
  Administered 2022-04-12: 650 mg via ORAL
  Filled 2022-04-09: qty 2

## 2022-04-09 MED ORDER — ONDANSETRON HCL 4 MG PO TABS: 4.0000 mg | ORAL_TABLET | Freq: Four times a day (QID) | ORAL | Status: DC | PRN

## 2022-04-09 MED ORDER — BUPROPION HCL ER (XL) 150 MG PO TB24
150.0000 mg | ORAL_TABLET | Freq: Every day | ORAL | Status: DC
Start: 2022-04-10 — End: 2022-04-13
  Administered 2022-04-10 – 2022-04-13 (×4): 150 mg via ORAL
  Filled 2022-04-09 (×4): qty 1

## 2022-04-09 MED ORDER — DULOXETINE HCL 60 MG PO CPEP
60.0000 mg | ORAL_CAPSULE | Freq: Every day | ORAL | Status: DC
  Administered 2022-04-10 – 2022-04-13 (×4): 60 mg via ORAL
  Filled 2022-04-09 (×4): qty 1

## 2022-04-09 MED ORDER — ACETAMINOPHEN 650 MG RE SUPP: 650.0000 mg | Freq: Four times a day (QID) | RECTAL | Status: DC | PRN

## 2022-04-09 MED ORDER — VANCOMYCIN HCL 2000 MG/400ML IV SOLN
2000.0000 mg | Freq: Once | INTRAVENOUS | Status: AC
  Administered 2022-04-09: 2000 mg via INTRAVENOUS
  Filled 2022-04-09: qty 400

## 2022-04-09 MED ORDER — LOSARTAN POTASSIUM 25 MG PO TABS
25.0000 mg | ORAL_TABLET | Freq: Every day | ORAL | Status: DC
Start: 2022-04-10 — End: 2022-04-13
  Administered 2022-04-10 – 2022-04-13 (×4): 25 mg via ORAL
  Filled 2022-04-09 (×4): qty 1

## 2022-04-09 MED ORDER — ATORVASTATIN CALCIUM 40 MG PO TABS
80.0000 mg | ORAL_TABLET | Freq: Every day | ORAL | Status: DC
  Administered 2022-04-10 – 2022-04-13 (×4): 80 mg via ORAL
  Filled 2022-04-09 (×4): qty 2

## 2022-04-09 MED ORDER — SODIUM CHLORIDE 0.9 % IV SOLN
2.0000 g | INTRAVENOUS | Status: DC
  Administered 2022-04-10 – 2022-04-13 (×4): 2 g via INTRAVENOUS
  Filled 2022-04-09 (×4): qty 20

## 2022-04-09 MED ORDER — SODIUM CHLORIDE 0.9% FLUSH
3.0000 mL | Freq: Two times a day (BID) | INTRAVENOUS | Status: DC
  Administered 2022-04-10 – 2022-04-13 (×8): 3 mL via INTRAVENOUS

## 2022-04-09 MED ORDER — INSULIN ASPART 100 UNIT/ML IJ SOLN
0.0000 [IU] | Freq: Three times a day (TID) | INTRAMUSCULAR | Status: DC
  Administered 2022-04-10 – 2022-04-11 (×5): 3 [IU] via SUBCUTANEOUS
  Administered 2022-04-11: 2 [IU] via SUBCUTANEOUS
  Administered 2022-04-12: 5 [IU] via SUBCUTANEOUS
  Administered 2022-04-12 (×2): 3 [IU] via SUBCUTANEOUS
  Administered 2022-04-12: 8 [IU] via SUBCUTANEOUS
  Administered 2022-04-13: 5 [IU] via SUBCUTANEOUS
  Filled 2022-04-09: qty 0.15

## 2022-04-09 MED ORDER — ALBUTEROL SULFATE (2.5 MG/3ML) 0.083% IN NEBU: 2.5000 mg | INHALATION_SOLUTION | RESPIRATORY_TRACT | Status: DC | PRN

## 2022-04-09 MED ORDER — SODIUM CHLORIDE 0.9 % IV BOLUS
500.0000 mL | Freq: Once | INTRAVENOUS | Status: AC
  Administered 2022-04-09: 500 mL via INTRAVENOUS

## 2022-04-09 MED ORDER — PANTOPRAZOLE SODIUM 40 MG PO TBEC
40.0000 mg | DELAYED_RELEASE_TABLET | Freq: Every day | ORAL | Status: DC
  Administered 2022-04-10 – 2022-04-13 (×4): 40 mg via ORAL
  Filled 2022-04-09 (×4): qty 1

## 2022-04-09 MED ORDER — ENOXAPARIN SODIUM 40 MG/0.4ML IJ SOSY
40.0000 mg | PREFILLED_SYRINGE | INTRAMUSCULAR | Status: DC
  Administered 2022-04-10 – 2022-04-13 (×4): 40 mg via SUBCUTANEOUS
  Filled 2022-04-09 (×4): qty 0.4

## 2022-04-09 MED ORDER — ONDANSETRON HCL 4 MG/2ML IJ SOLN: 4.0000 mg | Freq: Four times a day (QID) | INTRAMUSCULAR | Status: DC | PRN

## 2022-04-09 MED ORDER — SODIUM CHLORIDE 0.9 % IV SOLN
500.0000 mg | INTRAVENOUS | Status: DC
  Administered 2022-04-10 – 2022-04-13 (×4): 500 mg via INTRAVENOUS
  Filled 2022-04-09 (×4): qty 5

## 2022-04-09 MED ORDER — ACETAMINOPHEN 500 MG PO TABS
1000.0000 mg | ORAL_TABLET | Freq: Once | ORAL | Status: AC
  Administered 2022-04-09: 1000 mg via ORAL
  Filled 2022-04-09: qty 2

## 2022-04-09 NOTE — Assessment & Plan Note (Signed)
Continuing home regimen of daily PPI therapy.  

## 2022-04-09 NOTE — ED Provider Notes (Signed)
?[facility]-EMERGENCY DEPT ?Provider Note ? ? ?CSN: [id number] ?Arrival date & time: 04/09/22  1901 ? ?  ? ?History ? ?Chief Complaint  ?Patient presents with  ? Shortness of Breath  ? ? ?Larry [name] is a 61 y.o. male. ? ?61 year old male with prior medical history as detailed below presents for evaluation.  Patient complains of worsening shortness of breath, weakness, nausea for the last 4 days.  Patient reportedly was more hypoxic over the last 24 hours than his normal. ? ?Patient complains of cough and weakness. ? ?He denies fever. ? ?He denies chest pain. ? ?The history is provided by the patient and medical records.  ?Shortness of Breath ?Severity:  Moderate ?Onset quality:  Gradual ?Duration:  4 days ?Timing:  Constant ?Progression:  Worsening ?Chronicity:  New ? ?  ? ?Home Medications ?Prior to Admission medications   ?Medication Sig Start Date End Date Taking? Authorizing Provider  ?albuterol (VENTOLIN HFA) 108 (90 Base) MCG/ACT inhaler Inhale 2 puffs into the lungs every 6 (six) hours as needed for wheezing or shortness of breath. 09/14/20   Lewie Chamber, MD  ?aspirin EC 81 MG tablet Take 81 mg by mouth daily. Swallow whole.    [provider]  ?atorvastatin (LIPITOR) 80 MG tablet Take 1 tablet (80 mg total) by mouth daily. 10/05/20   Corky Crafts, MD  ?buPROPion (WELLBUTRIN XL) 150 MG 24 hr tablet Take 150 mg by mouth daily.    [provider]  ?clopidogrel (PLAVIX) 75 MG tablet Take 1 tablet (75 mg total) by mouth daily. 10/05/20   Corky Crafts, MD  ?Diroximel Fumarate (VUMERITY) 231 MG CPDR Take 462 mg by mouth in the morning and at bedtime.     [provider]  ?DULoxetine (CYMBALTA) 60 MG capsule Take 60 mg by mouth daily. 08/07/17   [provider]  ?empagliflozin (JARDIANCE) 10 MG TABS tablet Take 1 tablet (10 mg total) by mouth daily before breakfast. 08/08/21   Bensimhon, Bevelyn Buckles, MD  ?ezetimibe (ZETIA) 10 MG tablet TAKE 1  TABLET(10 MG) BY MOUTH DAILY 01/27/22   Quintella Reichert, MD  ?HYDROcodone bit-homatropine (HYCODAN) 5-1.5 MG/5ML syrup Take by mouth. 12/29/21   [provider]  ?levothyroxine (SYNTHROID) 50 MCG tablet Take 50 mcg by mouth daily before breakfast.    [provider]  ?losartan (COZAAR) 25 MG tablet Take by mouth. 02/16/22   [provider]  ?methocarbamol (ROBAXIN) 750 MG tablet Take 750 mg by mouth 3 (three) times daily. Scheduled    [provider]  ?midodrine (PROAMATINE) 5 MG tablet Take 1 tablet (5 mg total) by mouth 3 (three) times daily with meals. 02/15/22   Bensimhon, Bevelyn Buckles, MD  ?Multiple Vitamin (MULTIVITAMIN WITH MINERALS) TABS tablet Take 1 tablet by mouth daily.    [provider]  ?nitroGLYCERIN (NITROSTAT) 0.4 MG SL tablet Place 1 tablet (0.4 mg total) under the tongue every 5 (five) minutes as needed for chest pain. 09/29/20 08/08/22  Kroeger, Ovidio Kin., PA-C  ?pantoprazole (PROTONIX) 40 MG tablet Take 1 tablet (40 mg total) by mouth daily. 10/05/20   Corky Crafts, MD  ?pregabalin (LYRICA) 150 MG capsule Take 150 mg by mouth in the morning, at noon, and at bedtime.    [provider]  ?pregabalin (LYRICA) 200 MG capsule Take 200 mg by mouth 3 (three) times daily. 01/04/22   [provider]  ?zolpidem (AMBIEN) 10 MG tablet Take 10 mg by mouth at bedtime.  [provider]  ?   ? ?Allergies    ?Aspirin   ? ?Review of Systems   ?Review of Systems  ?Respiratory:  Positive for shortness of breath.   ?All other systems reviewed and are negative. ? ?Physical Exam ?Updated Vital Signs ?BP (!) 151/85   Pulse (!) 102   Temp 97.9 ?F (36.6 ?C)   Resp 19   SpO2 93%  ?Physical Exam ?Vitals and nursing note reviewed.  ?Constitutional:   ?   General: He is not in acute distress. ?   Appearance: Normal appearance. He is ill-appearing.  ?   Comments: Mild diaphoresis  ?HENT:  ?   Head: Normocephalic and atraumatic.  ?Eyes:  ?    Conjunctiva/sclera: Conjunctivae normal.  ?   Pupils: Pupils are equal, round, and reactive to light.  ?Cardiovascular:  ?   Rate and Rhythm: Normal rate and regular rhythm.  ?   Heart sounds: Normal heart sounds.  ?Pulmonary:  ?   Effort: Pulmonary effort is normal. No respiratory distress.  ?   Breath sounds: Normal breath sounds.  ?Abdominal:  ?   General: There is no distension.  ?   Palpations: Abdomen is soft.  ?   Tenderness: There is no abdominal tenderness.  ?Musculoskeletal:     ?   General: No deformity. Normal range of motion.  ?   Cervical back: Normal range of motion and neck supple.  ?Skin: ?   General: Skin is warm and dry.  ?Neurological:  ?   General: No focal deficit present.  ?   Mental Status: He is alert and oriented to person, place, and time.  ? ? ?ED Results / Procedures / Treatments   ?Labs ?(all labs ordered are listed, but only abnormal results are displayed) ?Labs Reviewed  ?COMPREHENSIVE METABOLIC PANEL - Abnormal; Notable for the following components:  ?    Result Value  ? Glucose, Bld 190 (*)   ? Creatinine, Ser 1.28 (*)   ? Calcium 8.8 (*)   ? Total Bilirubin 1.9 (*)   ? All other components within normal limits  ?CBC WITH DIFFERENTIAL/PLATELET - Abnormal; Notable for the following components:  ? Platelets 134 (*)   ? Lymphs Abs 0.3 (*)   ? Abs Immature Granulocytes 0.08 (*)   ? All other components within normal limits  ?LACTIC ACID, PLASMA - Abnormal; Notable for the following components:  ? Lactic Acid, Venous 2.5 (*)   ? All other components within normal limits  ?RESP PANEL BY RT-PCR (FLU A&B, COVID) [id number]  ?CULTURE, BLOOD (ROUTINE X 2)  ?CULTURE, BLOOD (ROUTINE X 2)  ?RESPIRATORY PANEL BY PCR  ?BRAIN NATRIURETIC PEPTIDE  ?PROTIME-INR  ?LACTIC ACID, PLASMA  ?URINALYSIS, ROUTINE W REFLEX MICROSCOPIC  ?PROCALCITONIN  ?BLOOD GAS, VENOUS  ?TROPONIN I (HIGH SENSITIVITY)  ?TROPONIN I (HIGH SENSITIVITY)  ? ? ?EKG ?EKG Interpretation ? ?Date/Time:  Sunday April 09 2022 19:10:45  EDT ?Ventricular Rate:  105 ?PR Interval:  142 ?QRS Duration: 110 ?QT Interval:  344 ?QTC Calculation: 455 ?R Axis:   -3 ?Text Interpretation: Sinus tachycardia Ventricular premature complex Nonspecific T abnormalities, inferior leads Confirmed by Kristine Royal [phone]) on 04/09/2022 7:48:28 PM ? ?Radiology ?DG Chest Port 1 View ? ?Result Date: 04/09/2022 ?CLINICAL DATA:  Cough short of breath EXAM: PORTABLE CHEST 1 VIEW COMPARISON:  10/28/2021 FINDINGS: Left-sided pacing device as before. Possible developing infiltrate in the right infrahilar lung. No pleural effusion. Normal cardiac size. No pneumothorax. Extensive hardware in the cervical spine  IMPRESSION: Possible developing pneumonia in the right infrahilar lung. Electronically Signed   By: Jasmine Pang M.D.   On: 04/09/2022 20:05   ? ?Procedures ?Procedures  ? ? ?Medications Ordered in ED ?Medications  ?acetaminophen (TYLENOL) tablet 1,000 mg (1,000 mg Oral Given 04/09/22 1931)  ?vancomycin (VANCOREADY) IVPB 2000 mg/400 mL (2,000 mg Intravenous New Bag/Given 04/09/22 2143)  ?ceFEPIme (MAXIPIME) 2 g in sodium chloride 0.9 % 100 mL IVPB (0 g Intravenous Stopped 04/09/22 2050)  ?sodium chloride 0.9 % bolus 500 mL (0 mLs Intravenous Stopped 04/09/22 2147)  ? ? ?ED Course/ Medical Decision Making/ A&P ?  ?                        ?Medical Decision Making ?Amount and/or Complexity of Data Reviewed ?Labs: ordered. ?Radiology: ordered. ? ?Risk ?OTC drugs. ?Prescription drug management. ?Decision regarding hospitalization. ? ? ? ?Medical Screen Complete ? ?This patient presented to the ED with complaint of shortness of breath, cough, fever. ? ?This complaint involves an extensive number of treatment options. The initial differential diagnosis includes, but is not limited to, pneumonia, other infectious process, etc. ? ?This presentation is: Acute, Self-Limited, Previously Undiagnosed, Uncertain Prognosis, Complicated, Systemic Symptoms, and Threat to Life/Bodily  Function ? ?Patient is presenting with worsening weakness, subjective fever, cough, shortness of breath. ? ?Presentation is concerning for possible pneumonia. ? ?Patient is noted to be febrile on arrival.  Patient is newly hypoxic. ? ?I

## 2022-04-09 NOTE — Assessment & Plan Note (Signed)
.   Patient been placed on Accu-Cheks before every meal and nightly with sliding scale insulin . Holding home regimen of oral hypoglycemics . Hemoglobin A1C ordered . Diabetic Diet  

## 2022-04-09 NOTE — Assessment & Plan Note (Addendum)
?   Lactic acidosis likely secondary to sepsis with concurrent volume depletion ?? Gently hydrating patient with intravenous isotonic fluids while monitoring for any evidence of volume overload considering patient's known history of systolic congestive heart failure. ?? Monitoring serial lactic acid levels to ensure downtrending and resolution. ? ?

## 2022-04-09 NOTE — Assessment & Plan Note (Addendum)
?   Patient is currently chest pain free ?? Monitoring patient on telemetry ?? Continue home regimen of antiplatelet therapy, lipid lowering therapy  ? ?

## 2022-04-09 NOTE — ED Triage Notes (Signed)
Pt. BIB [facility] c/o SOB, weakness, nausea and vomiting x4 days. EMS notes unclear lung sounds bilaterally. PT. O2 90% on RA. Placed on 4L by EMS. HX of CHF and MS. ? ?EMS vs: ?BP:136/70 ?HR:100 ?O2:95% 4L/min ?CBG: 153 ? ?

## 2022-04-09 NOTE — Progress Notes (Signed)
Pharmacy Note  ? ?A consult was received from an ED physician for vancomyicn and cefepime per pharmacy dosing. ?   ?The patient's profile has been reviewed for ht/wt/allergies/indication/available labs.   ? ?A one time order has been placed for vancomycin 2000 mg IV x1 and cefepime 2 gr IV x1 .   ? ?Further antibiotics/pharmacy consults should be ordered by admitting physician if indicated.       ?                ?Thank you, ? ?Adalberto Cole, PharmD, BCPS ?04/09/2022 7:56 PM ? ?

## 2022-04-09 NOTE — Assessment & Plan Note (Signed)
?   Longstanding chronic thrombocytopenia ?? There is a risk that thrombocytopenia could worsen with concurrent sepsis ?? No clinical evidence of bleeding ?? Monitoring platelet counts with serial CBCs ?

## 2022-04-09 NOTE — ED Notes (Signed)
Due to pt. Being a difficult stick, 1 set of cultures able to be obtained. Only blue top of second set able to be obtained. ?

## 2022-04-09 NOTE — Assessment & Plan Note (Addendum)
?   Notable acute hypoxic respiratory failure likely secondary to underlying pneumonia ?? Suspect possible concurrent COPD exacerbation considering lung exam findings of diffuse wheezing with prolonged expiratory phase.  Patient reports he has never been formally diagnosed with COPD in the past. ?? Patient was noted to be hypoxic at home with oxygen saturations in the 80s with EMS placing patient on 4 L of oxygen via nasal cannula ?? Treating underlying pneumonia with intravenous antibiotics ?? Treating with aggressive bronchodilator therapy and will add systemic steroids as well if patient does not rapidly improve ?? We will wean supplemental oxygen as able ? ?

## 2022-04-09 NOTE — Assessment & Plan Note (Signed)
?   Longstanding known history of multiple sclerosis since 2018 ?? Actively being treated with Vumerity ?? No clinical evidence of concurrent MS flare at this time ?

## 2022-04-09 NOTE — Assessment & Plan Note (Addendum)
--  viral panel: positive for coronavirus NL 63 ? ?

## 2022-04-09 NOTE — Assessment & Plan Note (Signed)
•   No clinical evidence of cardiogenic volume overload ° °

## 2022-04-09 NOTE — Assessment & Plan Note (Signed)
.   Continuing home regimen of lipid lowering therapy.  

## 2022-04-09 NOTE — H&P (Signed)
?History and Physical  ? ? ?Patient: Larry [name] MRN: [id number] DOA: 04/09/2022 ? ?Date of Service: the patient was seen and examined on 04/10/2022 ? ?Patient coming from: Home via EMS ? ?Chief Complaint:  ?Chief Complaint  ?Patient presents with  ? Shortness of Breath  ? ? ?HPI:  ? 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 ? ?Patient explains that for approximately the past 2 weeks he began to experience cough.  Cough has been productive with small amounts of green sputum.  Patient states that this cough persisted and approximately 1 week ago he began to experience associated shortness of breath nausea vomiting and poor oral intake.  In the days that followed patient developed progressively worsening shortness of breath and increasing intensity and cough.  Patient also complains of associated generalized weakness and malaise.  Upon further questioning patient denies recent travel, sick contacts or contact with confirmed COVID-19 infection. ? ?Patient symptoms continue to worsen and the patient reports that he checked his oxygen at home and found it to be in the 80s which prompted him to contact EMS.  In route, EMS had placed him on 4 L of oxygen via nasal cannula and brought the patient into [facility] long hospital emergency department for evaluation. ? ?Upon evaluation in the emergency department patient was found  to exhibit multiple SIRS criteria including fever of 100.5 ?F tachycardia and tachypnea.  Patient continued to exhibit hypoxia and was administered supplemental oxygen.  Chest x-ray revealed a possible developing infiltrate in the right infrahilar region.  Patient was initiated on intravenous vancomycin and cefepime.  Patient was found to be COVID-19 negative.  The hospitalist group was then called to assess the patient for admission to the hospital. ? ? ? ?Review of Systems: Review of Systems  ?Constitutional:  Positive for fever and malaise/fatigue.  ?Respiratory:  Positive for cough.   ?Gastrointestinal:  Positive for nausea and vomiting.  ?Neurological:  Positive for weakness.  ?All other systems reviewed and are negative. ? ? ?Past Medical History:  ?Diagnosis Date  ? Arthritis   ? Chronic systolic CHF (congestive heart failure) (HCC) 04/09/2022  ? Coronary artery disease involving native coronary artery of native heart without angina pectoris   ? GERD without esophagitis 09/12/2020  ? Major depressive disorder 09/12/2020  ? Mixed hyperlipidemia 09/12/2020  ? Multiple sclerosis (HCC) 09/12/2020  ? Nerve pain   ? Thrombocytopenia (HCC) 08/28/2017  ? ? ?Past Surgical History:  ?Procedure Laterality Date  ? CORONARY STENT INTERVENTION N/A 10/05/2020  ? Procedure: CORONARY STENT INTERVENTION;  Surgeon: Corky Crafts, MD;  Location: [facility] INVASIVE CV LAB;  Service: Cardiovascular;  Laterality: N/A;  LAD  ? ICD IMPLANT N/A 01/14/2021  ? Procedure: ICD IMPLANT;  Surgeon: Lanier Prude, MD;  Location: [facility] INVASIVE CV LAB;  Service: Cardiovascular;  Laterality: N/A;  ? INTRAVASCULAR PRESSURE WIRE/FFR STUDY N/A 10/05/2020  ? Procedure: INTRAVASCULAR PRESSURE WIRE/FFR STUDY;  Surgeon: Corky Crafts, MD;  Location: [facility] INVASIVE CV LAB;  Service: Cardiovascular;  Laterality: N/A;  ? INTRAVASCULAR ULTRASOUND/IVUS  N/A 10/05/2020  ? Procedure: Intravascular Ultrasound/IVUS;  Surgeon: Corky Crafts, MD;  Location:  [facility] INVASIVE CV LAB;  Service: Cardiovascular;  Laterality: N/A;  LAD  ? LEFT HEART CATH AND CORONARY ANGIOGRAPHY N/A 10/05/2020  ? Procedure: LEFT HEART CATH AND CORONARY ANGIOGRAPHY;  Surgeon: Corky Crafts, MD;  Location: [facility] INVASIVE CV LAB;  Service: Cardiovascular;  Laterality: N/A;  ? ? ?Social History:  reports that he has been smoking cigarettes. He has never used smokeless tobacco. He reports that he does not drink alcohol and does not use drugs. ? ?Allergies  ?Allergen Reactions  ? Aspirin Other (See Comments)  ?  Nose Bleeding (with high doses)  ? ? ?Family History  ?Problem Relation Age of Onset  ? Heart attack Mother 16  ? Lung cancer Neg Hx   ? ? ?Prior to Admission medications   ?Medication Sig Start Date End Date Taking? Authorizing Provider  ?albuterol (VENTOLIN HFA) 108 (90 Base) MCG/ACT inhaler Inhale 2 puffs into the lungs every 6 (six) hours as needed for wheezing or shortness of breath. 09/14/20   Lewie Chamber, MD  ?aspirin EC 81 MG tablet Take 81 mg by mouth daily. Swallow whole.    [provider]  ?atorvastatin (LIPITOR) 80 MG tablet Take 1 tablet (80 mg total) by mouth daily. 10/05/20   Corky Crafts, MD  ?buPROPion (WELLBUTRIN XL) 150 MG 24 hr tablet Take 150 mg by mouth daily.    [provider]  ?clopidogrel (PLAVIX) 75 MG tablet Take 1 tablet (75 mg total) by mouth daily. 10/05/20   Corky Crafts, MD  ?Diroximel Fumarate (VUMERITY) 231 MG CPDR Take 462 mg by mouth in the morning and at bedtime.     [provider]  ?DULoxetine (CYMBALTA) 60 MG capsule Take 60 mg by mouth daily. 08/07/17   [provider]  ?empagliflozin (JARDIANCE) 10 MG TABS tablet Take 1 tablet (10 mg total) by mouth daily before breakfast. 08/08/21   Bensimhon, Bevelyn Buckles, MD  ?ezetimibe (ZETIA) 10 MG tablet TAKE 1 TABLET(10 MG) BY MOUTH DAILY 01/27/22   Quintella Reichert, MD  ?HYDROcodone bit-homatropine (HYCODAN) 5-1.5 MG/5ML syrup Take by mouth. 12/29/21   [provider]  ?levothyroxine (SYNTHROID) 50 MCG tablet Take 50 mcg by mouth daily before breakfast.    [provider]  ?losartan (COZAAR) 25 MG tablet Take by mouth. 02/16/22   [provider]  ?methocarbamol (ROBAXIN) 750 MG tablet Take 750 mg by mouth 3 (three) times daily. Scheduled    [provider]  ?midodrine (PROAMATINE) 5 MG tablet Take 1 tablet (5 mg total) by mouth 3 (three) times daily with meals. 02/15/22   Bensimhon, Bevelyn Buckles, MD  ?Multiple Vitamin (MULTIVITAMIN WITH MINERALS) TABS tablet Take 1 tablet by mouth daily.    [provider]  ?nitroGLYCERIN (NITROSTAT) 0.4 MG SL tablet Place 1 tablet (0.4 mg total) under the tongue every 5 (five) minutes as needed for chest pain. 09/29/20 08/08/22  Kroeger, Ovidio Kin., PA-C  ?pantoprazole (PROTONIX) 40 MG tablet Take 1 tablet (40 mg total) by mouth daily. 10/05/20   Corky Crafts, MD  ?pregabalin (LYRICA) 150 MG capsule Take 150 mg by mouth in the morning, at noon, and at bedtime.    [provider]  ?pregabalin (LYRICA) 200 MG capsule Take 200 mg by mouth 3 (three) times daily. 01/04/22   [provider]  ?zolpidem (AMBIEN) 10 MG tablet Take 10 mg by mouth at bedtime.  [provider]  ? ? ?Physical Exam: ? ?Vitals:  ? 04/09/22 2330 04/10/22 0022 04/10/22 0310 04/10/22 0405  ?BP: (!) 151/85 (!) 145/78  126/79  ?Pulse: (!) 102 95  88  ?Resp: 19 (!) 22 19 (!) 22  ?Temp: 97.9 ?F (36.6 ?C) 97.8 ?F (36.6 ?C)  99.2 ?F (37.3 ?C)  ?TempSrc:  Oral  Axillary  ?SpO2: 93% 96%  94%  ?Weight:  111.2 kg    ?Height:  5\' 10"  (1.778 m)    ? ? ? ?Constitutional: Awake alert and oriented x3, patient is notably in respiratory distress ?Skin: no rashes, no lesions, slightly poor skin turgor noted. ?Eyes: Pupils are equally reactive to light.  No evidence of scleral icterus or conjunctival pallor.  ?ENMT: Dry mucous membranes noted.  Posterior pharynx clear of any exudate or lesions.   ?Neck: normal, supple, no  masses, no thyromegaly.  No evidence of jugular venous distension.   ?Respiratory: Notably coarse breath sounds with both inspiratory and expiratory wheezing with prolonged expiratory phase.  No rale

## 2022-04-09 NOTE — Assessment & Plan Note (Signed)
?   Continue home regimen of psychotropic regimen 

## 2022-04-10 DIAGNOSIS — J9601 Acute respiratory failure with hypoxia: Secondary | ICD-10-CM | POA: Diagnosis not present

## 2022-04-10 DIAGNOSIS — I5022 Chronic systolic (congestive) heart failure: Secondary | ICD-10-CM | POA: Diagnosis not present

## 2022-04-10 DIAGNOSIS — J189 Pneumonia, unspecified organism: Secondary | ICD-10-CM | POA: Diagnosis not present

## 2022-04-10 DIAGNOSIS — J441 Chronic obstructive pulmonary disease with (acute) exacerbation: Secondary | ICD-10-CM

## 2022-04-10 LAB — CBC WITH DIFFERENTIAL/PLATELET
Abs Immature Granulocytes: 0.01 10*3/uL (ref 0.00–0.07)
Basophils Absolute: 0.1 10*3/uL (ref 0.0–0.1)
Basophils Relative: 1 %
Eosinophils Absolute: 0 10*3/uL (ref 0.0–0.5)
Eosinophils Relative: 0 %
HCT: 49.1 % (ref 39.0–52.0)
Hemoglobin: 15.8 g/dL (ref 13.0–17.0)
Immature Granulocytes: 0 %
Lymphocytes Relative: 7 %
Lymphs Abs: 0.5 10*3/uL — ABNORMAL LOW (ref 0.7–4.0)
MCH: 29.2 pg (ref 26.0–34.0)
MCHC: 32.2 g/dL (ref 30.0–36.0)
MCV: 90.8 fL (ref 80.0–100.0)
Monocytes Absolute: 0.9 10*3/uL (ref 0.1–1.0)
Monocytes Relative: 13 %
Neutro Abs: 5.5 10*3/uL (ref 1.7–7.7)
Neutrophils Relative %: 79 %
Platelets: 129 10*3/uL — ABNORMAL LOW (ref 150–400)
RBC: 5.41 MIL/uL (ref 4.22–5.81)
RDW: 14.7 % (ref 11.5–15.5)
WBC: 6.9 10*3/uL (ref 4.0–10.5)
nRBC: 0 % (ref 0.0–0.2)

## 2022-04-10 LAB — MAGNESIUM: Magnesium: 1.9 mg/dL (ref 1.7–2.4)

## 2022-04-10 LAB — URINALYSIS, ROUTINE W REFLEX MICROSCOPIC
Bacteria, UA: NONE SEEN
Bilirubin Urine: NEGATIVE
Glucose, UA: >=500 mg/dL — AB
Ketones, ur: 20 mg/dL — AB
Leukocytes,Ua: NEGATIVE
Nitrite: NEGATIVE
Protein, ur: NEGATIVE mg/dL
Specific Gravity, Urine: 1.032 — ABNORMAL HIGH (ref 1.005–1.030)
pH: 5 (ref 5.0–8.0)

## 2022-04-10 LAB — BLOOD GAS, VENOUS
Acid-base deficit: 1.7 mmol/L (ref 0.0–2.0)
Bicarbonate: 24.3 mmol/L (ref 20.0–28.0)
O2 Saturation: 63.1 %
Patient temperature: 37
pCO2, Ven: 45 mmHg (ref 44–60)
pH, Ven: 7.34 (ref 7.25–7.43)
pO2, Ven: 33 mmHg (ref 32–45)

## 2022-04-10 LAB — BLOOD CULTURE ID PANEL (REFLEXED) - BCID2

## 2022-04-10 LAB — MRSA NEXT GEN BY PCR, NASAL: MRSA by PCR Next Gen: NOT DETECTED

## 2022-04-10 LAB — GLUCOSE, CAPILLARY
Glucose-Capillary: 159 mg/dL — ABNORMAL HIGH (ref 70–99)
Glucose-Capillary: 182 mg/dL — ABNORMAL HIGH (ref 70–99)
Glucose-Capillary: 167 mg/dL — ABNORMAL HIGH (ref 70–99)
Glucose-Capillary: 88 mg/dL (ref 70–99)

## 2022-04-10 LAB — COMPREHENSIVE METABOLIC PANEL
ALT: 23 U/L (ref 0–44)
AST: 21 U/L (ref 15–41)
Albumin: 4 g/dL (ref 3.5–5.0)
Alkaline Phosphatase: 70 U/L (ref 38–126)
Anion gap: 10 (ref 5–15)
BUN: 13 mg/dL (ref 6–20)
CO2: 22 mmol/L (ref 22–32)
Calcium: 8.5 mg/dL — ABNORMAL LOW (ref 8.9–10.3)
Chloride: 107 mmol/L (ref 98–111)
Creatinine, Ser: 1.2 mg/dL (ref 0.61–1.24)
GFR, Estimated: >60 mL/min (ref >60)
Glucose, Bld: 200 mg/dL — ABNORMAL HIGH (ref 70–99)
Potassium: 3.9 mmol/L (ref 3.5–5.1)
Sodium: 139 mmol/L (ref 135–145)
Total Bilirubin: 1.6 mg/dL — ABNORMAL HIGH (ref 0.3–1.2)
Total Protein: 7.5 g/dL (ref 6.5–8.1)

## 2022-04-10 LAB — HEMOGLOBIN A1C
Hgb A1c MFr Bld: 5.8 % — ABNORMAL HIGH (ref 4.8–5.6)
Mean Plasma Glucose: 119.76 mg/dL

## 2022-04-10 LAB — RESPIRATORY PANEL BY PCR

## 2022-04-10 LAB — PROCALCITONIN: Procalcitonin: 0.9 ng/mL

## 2022-04-10 MED ORDER — IPRATROPIUM-ALBUTEROL 0.5-2.5 (3) MG/3ML IN SOLN
3.0000 mL | Freq: Four times a day (QID) | RESPIRATORY_TRACT | Status: DC
Start: 2022-04-10 — End: 2022-04-12
  Administered 2022-04-10 – 2022-04-12 (×12): 3 mL via RESPIRATORY_TRACT
  Filled 2022-04-10 (×11): qty 3

## 2022-04-10 MED ORDER — GUAIFENESIN-DM 100-10 MG/5ML PO SYRP
5.0000 mL | ORAL_SOLUTION | ORAL | Status: DC | PRN
  Administered 2022-04-10 – 2022-04-12 (×4): 5 mL via ORAL
  Filled 2022-04-10 (×4): qty 10

## 2022-04-10 MED ORDER — MOMETASONE FURO-FORMOTEROL FUM 200-5 MCG/ACT IN AERO
2.0000 | INHALATION_SPRAY | Freq: Two times a day (BID) | RESPIRATORY_TRACT | Status: DC
  Administered 2022-04-10 – 2022-04-13 (×7): 2 via RESPIRATORY_TRACT
  Filled 2022-04-10: qty 8.8

## 2022-04-10 MED ORDER — PREDNISONE 20 MG PO TABS
40.0000 mg | ORAL_TABLET | Freq: Every day | ORAL | Status: DC
Start: 2022-04-10 — End: 2022-04-10
  Administered 2022-04-10: 40 mg via ORAL
  Filled 2022-04-10: qty 2

## 2022-04-10 MED ORDER — DIROXIMEL FUMARATE 231 MG PO CPDR
462.0000 mg | DELAYED_RELEASE_CAPSULE | Freq: Two times a day (BID) | ORAL | Status: DC
  Administered 2022-04-10 – 2022-04-13 (×7): 462 mg via ORAL

## 2022-04-10 MED ORDER — DIROXIMEL FUMARATE 231 MG PO CPDR
462.0000 mg | DELAYED_RELEASE_CAPSULE | Freq: Two times a day (BID) | ORAL | Status: DC
  Filled 2022-04-10: qty 2

## 2022-04-10 MED ORDER — METHYLPREDNISOLONE SODIUM SUCC 40 MG IJ SOLR
40.0000 mg | Freq: Two times a day (BID) | INTRAMUSCULAR | Status: DC
  Administered 2022-04-10 – 2022-04-12 (×4): 40 mg via INTRAVENOUS
  Filled 2022-04-10 (×4): qty 1

## 2022-04-10 MED ORDER — PREGABALIN 75 MG PO CAPS
200.0000 mg | ORAL_CAPSULE | Freq: Three times a day (TID) | ORAL | Status: DC
  Administered 2022-04-10 – 2022-04-13 (×10): 200 mg via ORAL
  Filled 2022-04-10 (×10): qty 1

## 2022-04-10 MED ORDER — IPRATROPIUM-ALBUTEROL 0.5-2.5 (3) MG/3ML IN SOLN: 3.0000 mL | RESPIRATORY_TRACT | Status: DC | PRN

## 2022-04-10 MED ORDER — ASPIRIN EC 81 MG PO TBEC
81.0000 mg | DELAYED_RELEASE_TABLET | Freq: Every day | ORAL | Status: DC
  Administered 2022-04-10 – 2022-04-13 (×4): 81 mg via ORAL
  Filled 2022-04-10 (×4): qty 1

## 2022-04-10 NOTE — Progress Notes (Signed)
?PROGRESS NOTE ? ? ? ?Larry [name]  [id number] DOB: 09-22-1961 DOA: 04/09/2022 ?PCP: [name], MD  ? 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  On presentation, he was saturating in the 80s on room air and required supplemental oxygen.  He had a temperature of 100.5 with tachycardia and tachypnea with possible right infrahilar infiltrate.  He was started on broad-spectrum antibiotics. ? ?Assessment & Plan: ?  ?Severe sepsis: Present on admission ?Community-acquired right lower lobe pneumonia ?Lactic acidosis: Resolved ?Acute respiratory failure with hypoxia ?-Presented with fever, tachycardia, tachypnea with elevated lactic acid and acute respiratory failure with evidence of pneumonia ?-COVID-19 and influenza testing negative on admission.  Lactic acidosis has resolved.  Procalcitonin 0.9 on presentation. ?-Continue Rocephin and Zithromax.  Follow blood cultures. ?-Currently on 4 L oxygen via nasal cannula.  Wean off as able ? ?COPD with exacerbation ?-Continue nebs.  Add Dulera.  Switch prednisone to Solu-Medrol ? ?Multiple sclerosis ?-Since 2018.  Actively being treated with Vumerity.  No evidence of MS flare at this time.  Outpatient follow-up with neurology ? ?Chronic systolic heart failure/chronic ischemic cardiomyopathy with history of AICD ?CAD with history of stent ?Hyperlipidemia ?-Currently compensated with no evidence of chest pain. ?-Strict input and daily weights.  Fluid restriction.  Continue statin, Plavix, losartan.  Outpatient follow-up with cardiology ? ?Diabetes mellitus type 2 with diabetic  polyneuropathy ?-A1c 5.8.  Continue CBGs with SSI.  Outpatient follow-up.  Carb modified diet ? ?Depression ?-Continue duloxetine, bupropion.  Outpatient follow-up ? ?Orthostatic hypotension ?Continue midodrine ? ?GERD ?-Continue Protonix ? ?OSA on CPAP ?-Continue CPAP at night ? ?Thrombocytopenia ?-longstanding chronic thrombocytopenia.  No signs of bleeding.  Monitor intermittently. ? ?Tobacco abuse ?-Ongoing.  Counseled regarding cessation ? ? ? ?DVT prophylaxis: Lovenox ?Code Status: Full ?Family Communication: None at bedside ?Disposition Plan: ?Status is: Inpatient ?Remains inpatient appropriate because: Of severity of illness.  Need for IV antibiotics. ? ?Consultants: None ? ?Procedures: None ? ?Antimicrobials: Rocephin and Zithromax from 04/09/2022 onwards ? ? ?Subjective: ?Patient seen and examined at bedside.  Feels only slightly better.  Still short of breath with exertion.  No chest pain, abdominal pain, nausea or vomiting reported. ? ?Objective: ?Vitals:  ? 04/10/22 0022 04/10/22 0310 04/10/22 0405 04/10/22 0827  ?BP: (!) 145/78  126/79 123/77  ?Pulse: 95  88 86  ?Resp: (!) 22 19 (!) 22 18  ?Temp: 97.8 ?F (36.6 ?C)  99.2 ?F (37.3 ?C) 98.7 ?F (37.1 ?C)  ?TempSrc: Oral  Axillary Oral  ?SpO2: 96%  94% 99%  ?Weight: 111.2 kg     ?Height: 5\' 10"  (1.778 m)     ? ? ?Intake/Output Summary (Last 24 hours) at 04/10/2022 1048 ?Last data filed at 04/10/2022 [id number] ?Gross per 24 hour  ?Intake 859.49 ml  ?Output --  ?Net 859.49 ml  ? ?Filed Weights  ? 04/10/22 0022  ?Weight: 111.2 kg  ? ? ?Examination: ? ?General exam: Appears calm and comfortable.  Looks chronically ill and deconditioned.  Currently on 4 L oxygen by nasal cannula. ?Respiratory system: Bilateral decreased breath sounds at bases with scattered crackles and intermittent tachypnea ?Cardiovascular system: S1 & S2 heard, Rate controlled ?Gastrointestinal system: Abdomen is  nondistended, soft and nontender. Normal bowel sounds heard. ?Extremities: No cyanosis,  clubbing; trace lower extremity edema ?Central nervous system: Alert and oriented.  Slow to respond.  No focal neurological deficits. Moving extremities ?Skin: No rashes, lesions or ulcers ?Psychiatry: Affect is mostly flat.  No signs of agitation. ? ? ? ?Data Reviewed: I have personally reviewed following labs and imaging studies ? ?CBC: ?Recent Labs  ?Lab 04/09/22 ?1938 04/10/22 ?[id number]  ?WBC 8.1 6.9  ?NEUTROABS 6.6 5.5  ?HGB 16.3 15.8  ?HCT 50.2 49.1  ?MCV 90.1 90.8  ?PLT 134* 129*  ? ?Basic Metabolic Panel: ?Recent Labs  ?Lab 04/09/22 ?1938 04/10/22 ?[id number]  ?NA 137 139  ?K 3.8 3.9  ?CL 104 107  ?CO2 23 22  ?GLUCOSE 190* 200*  ?BUN 12 13  ?CREATININE 1.28* 1.20  ?CALCIUM 8.8* 8.5*  ?MG  --  1.9  ? ?GFR: ?Estimated Creatinine Clearance: 81.8 mL/min (by C-G formula based on SCr of 1.2 mg/dL). ?Liver Function Tests: ?Recent Labs  ?Lab 04/09/22 ?1938 04/10/22 ?[id number]  ?AST 20 21  ?ALT 23 23  ?ALKPHOS 72 70  ?BILITOT 1.9* 1.6*  ?PROT 8.1 7.5  ?ALBUMIN 4.4 4.0  ? ?No results for input(s): LIPASE, AMYLASE in the last 168 hours. ?No results for input(s): AMMONIA in the last 168 hours. ?Coagulation Profile: ?Recent Labs  ?Lab 04/09/22 ?1938  ?INR 1.1  ? ?Cardiac Enzymes: ?No results for input(s): CKTOTAL, CKMB, CKMBINDEX, TROPONINI in the last 168 hours. ?BNP (last 3 results) ?No results for input(s): PROBNP in the last 8760 hours. ?HbA1C: ?Recent Labs  ?  04/10/22 ?[id number]  ?HGBA1C 5.8*  ? ?CBG: ?Recent Labs  ?Lab 04/10/22 ?0906  ?GLUCAP 159*  ? ?Lipid Profile: ?No results for input(s): CHOL, HDL, LDLCALC, TRIG, CHOLHDL, LDLDIRECT in the last 72 hours. ?Thyroid Function Tests: ?No results for input(s): TSH, T4TOTAL, FREET4, T3FREE, THYROIDAB in the last 72 hours. ?Anemia Panel: ?No results for input(s): VITAMINB12, FOLATE, FERRITIN, TIBC, IRON, RETICCTPCT in the last 72 hours. ?Sepsis Labs: ?Recent Labs  ?Lab 04/09/22 ?1938 04/09/22 ?2140  ?PROCALCITON 0.90  --   ?LATICACIDVEN 2.5* 1.8  ? ? ?Recent Results (from the past 240  hour(s))  ?Resp Panel by RT-PCR (Flu A&B, Covid) Nasopharyngeal Swab     Status: None  ? Collection Time: 04/09/22  7:54 PM  ? Specimen: Nasopharyngeal Swab; Nasopharyngeal(NP) swabs in vial transport medium  ?Result Value Ref Range Status  ? SARS Coronavirus 2 by RT PCR NEGATIVE NEGATIVE Final  ?  Comment: (NOTE) ?SARS-CoV-2 target nucleic acids are NOT DETECTED. ? ?The SARS-CoV-2 RNA is generally detectable in upper respiratory ?specimens during the acute phase of infection. The lowest ?concentration of SARS-CoV-2 viral copies this assay can detect is ?138 copies/mL. A negative result does not preclude SARS-Cov-2 ?infection and should not be used as the sole basis for treatment or ?other patient management decisions. A negative result may occur with  ?improper specimen collection/handling, submission of specimen other ?than nasopharyngeal swab, presence of viral mutation(s) within the ?areas targeted by this assay, and inadequate number of viral ?copies(<138 copies/mL). A negative result must be combined with ?clinical observations, patient history, and epidemiological ?information. The expected result is Negative. ? ?Fact Sheet for Patients:  ?EntrepreneurPulse.com.au ? ?Fact Sheet for Healthcare Providers:  ?IncredibleEmployment.be ? ?This test is no t yet approved or cleared by the [place] FDA and  ?has been authorized for detection and/or diagnosis of SARS-CoV-2 by ?FDA under an Emergency Use Authorization (EUA). This EUA will remain  ?  in effect (meaning this test can be used) for the duration of the ?COVID-19 declaration under Section 564(b)(1) of the Act, 21 ?U.S.C.section 360bbb-3(b)(1), unless the authorization is terminated  ?or revoked sooner.  ? ? ?  ? Influenza A by PCR NEGATIVE NEGATIVE Final  ? Influenza B by PCR NEGATIVE NEGATIVE Final  ?  Comment: (NOTE) ?The Xpert Xpress SARS-CoV-2/FLU/RSV plus assay is intended as an aid ?in the diagnosis of influenza from  Nasopharyngeal swab specimens and ?should not be used as a sole basis for treatment. Nasal washings and ?aspirates are unacceptable for Xpert Xpress SARS-CoV-2/FLU/RSV ?testing. ? ?Fact Sheet for Patie

## 2022-04-10 NOTE — Progress Notes (Signed)
PHARMACY - PHYSICIAN COMMUNICATION ?CRITICAL VALUE ALERT - BLOOD CULTURE IDENTIFICATION (BCID) ? ?Key Larry [name] is an 61 y.o. male who presented to [facility] on 04/09/2022 with a chief complaint of productive cough, shortness of breath and nausea/vomiting. ? ?Assessment:   ?CXR with possible developing PNA on right.  Resp PCR + coronovirus NL63.   ?BCx + GPC in aerobic bottle of 1 set only; BCID + Staph species- likely contaminant.  ?Now afebrile, hemodynamically stable.  ? ?Name of physician (or Provider) Contacted: Johann Capers ? ?Current antibiotics: Rocephin + Zithromax ? ?Changes to prescribed antibiotics recommended:  ?Recommend continue abx for PNA - minimize abx duration since known viral pathogen ?Patient is on recommended antibiotics - No changes needed ? ?Results for orders placed or performed during the hospital encounter of 04/09/22  ?Blood Culture ID Panel (Reflexed) (Collected: 04/09/2022  7:38 PM)  ?Result Value Ref Range  ? Enterococcus faecalis NOT DETECTED NOT DETECTED  ? Enterococcus Faecium NOT DETECTED NOT DETECTED  ? Listeria monocytogenes NOT DETECTED NOT DETECTED  ? Staphylococcus species DETECTED (A) NOT DETECTED  ? Staphylococcus aureus (BCID) NOT DETECTED NOT DETECTED  ? Staphylococcus epidermidis NOT DETECTED NOT DETECTED  ? Staphylococcus lugdunensis NOT DETECTED NOT DETECTED  ? Streptococcus species NOT DETECTED NOT DETECTED  ? Streptococcus agalactiae NOT DETECTED NOT DETECTED  ? Streptococcus pneumoniae NOT DETECTED NOT DETECTED  ? Streptococcus pyogenes NOT DETECTED NOT DETECTED  ? A.calcoaceticus-baumannii NOT DETECTED NOT DETECTED  ? Bacteroides fragilis NOT DETECTED NOT DETECTED  ? Enterobacterales NOT DETECTED NOT DETECTED  ? Enterobacter cloacae complex NOT DETECTED NOT DETECTED  ? Escherichia coli NOT DETECTED NOT DETECTED  ? Klebsiella aerogenes NOT DETECTED NOT DETECTED  ? Klebsiella oxytoca NOT DETECTED NOT DETECTED  ? Klebsiella pneumoniae NOT DETECTED NOT DETECTED  ?  Proteus species NOT DETECTED NOT DETECTED  ? Salmonella species NOT DETECTED NOT DETECTED  ? Serratia marcescens NOT DETECTED NOT DETECTED  ? Haemophilus influenzae NOT DETECTED NOT DETECTED  ? Neisseria meningitidis NOT DETECTED NOT DETECTED  ? Pseudomonas aeruginosa NOT DETECTED NOT DETECTED  ? Stenotrophomonas maltophilia NOT DETECTED NOT DETECTED  ? Candida albicans NOT DETECTED NOT DETECTED  ? Candida auris NOT DETECTED NOT DETECTED  ? Candida glabrata NOT DETECTED NOT DETECTED  ? Candida krusei NOT DETECTED NOT DETECTED  ? Candida parapsilosis NOT DETECTED NOT DETECTED  ? Candida tropicalis NOT DETECTED NOT DETECTED  ? Cryptococcus neoformans/gattii NOT DETECTED NOT DETECTED  ? ? ?Junita Push PharmD ?04/10/2022  10:31 PM ? ?

## 2022-04-10 NOTE — Assessment & Plan Note (Signed)
?   Physical exam reveals diffuse wheezing with both inspiratory and expiratory wheezing and prolonged expiratory phase ?? Patient is a longstanding history of smoking and still continues to smoke ?? While patient reports never having been formally diagnosed with COPD in the past I believe patient is currently suffering from a COPD exacerbation now concurrently with his pneumonia ?? Initiating aggressive bronchodilator therapy ?? We will additionally initiate a modest regimen of steroid with 40 mg of prednisone daily ?

## 2022-04-10 NOTE — Progress Notes (Signed)
Patient refused CPAP for the night  

## 2022-04-10 NOTE — Assessment & Plan Note (Signed)
?   Initiating home regimen of CPAP nightly. ?

## 2022-04-11 DIAGNOSIS — I5022 Chronic systolic (congestive) heart failure: Secondary | ICD-10-CM | POA: Diagnosis not present

## 2022-04-11 DIAGNOSIS — J189 Pneumonia, unspecified organism: Secondary | ICD-10-CM | POA: Diagnosis not present

## 2022-04-11 DIAGNOSIS — J9601 Acute respiratory failure with hypoxia: Secondary | ICD-10-CM | POA: Diagnosis not present

## 2022-04-11 DIAGNOSIS — J441 Chronic obstructive pulmonary disease with (acute) exacerbation: Secondary | ICD-10-CM | POA: Diagnosis not present

## 2022-04-11 LAB — GLUCOSE, CAPILLARY
Glucose-Capillary: 183 mg/dL — ABNORMAL HIGH (ref 70–99)
Glucose-Capillary: 156 mg/dL — ABNORMAL HIGH (ref 70–99)
Glucose-Capillary: 135 mg/dL — ABNORMAL HIGH (ref 70–99)
Glucose-Capillary: 132 mg/dL — ABNORMAL HIGH (ref 70–99)

## 2022-04-11 LAB — BASIC METABOLIC PANEL
Anion gap: 7 (ref 5–15)
BUN: 20 mg/dL (ref 6–20)
CO2: 24 mmol/L (ref 22–32)
Calcium: 8.6 mg/dL — ABNORMAL LOW (ref 8.9–10.3)
Chloride: 108 mmol/L (ref 98–111)
Creatinine, Ser: 1.17 mg/dL (ref 0.61–1.24)
GFR, Estimated: >60 mL/min (ref >60)
Glucose, Bld: 181 mg/dL — ABNORMAL HIGH (ref 70–99)
Potassium: 3.9 mmol/L (ref 3.5–5.1)
Sodium: 139 mmol/L (ref 135–145)

## 2022-04-11 LAB — MAGNESIUM: Magnesium: 2.3 mg/dL (ref 1.7–2.4)

## 2022-04-11 LAB — CBC WITH DIFFERENTIAL/PLATELET
Abs Immature Granulocytes: 0.09 10*3/uL — ABNORMAL HIGH (ref 0.00–0.07)
Basophils Absolute: 0 10*3/uL (ref 0.0–0.1)
Basophils Relative: 0 %
Eosinophils Absolute: 0 10*3/uL (ref 0.0–0.5)
Eosinophils Relative: 0 %
HCT: 45.1 % (ref 39.0–52.0)
Hemoglobin: 15.1 g/dL (ref 13.0–17.0)
Immature Granulocytes: 1 %
Lymphocytes Relative: 6 %
Lymphs Abs: 0.5 10*3/uL — ABNORMAL LOW (ref 0.7–4.0)
MCH: 30 pg (ref 26.0–34.0)
MCHC: 33.5 g/dL (ref 30.0–36.0)
MCV: 89.5 fL (ref 80.0–100.0)
Monocytes Absolute: 0.4 10*3/uL (ref 0.1–1.0)
Monocytes Relative: 5 %
Neutro Abs: 6.9 10*3/uL (ref 1.7–7.7)
Neutrophils Relative %: 88 %
Platelets: 144 10*3/uL — ABNORMAL LOW (ref 150–400)
RBC: 5.04 MIL/uL (ref 4.22–5.81)
RDW: 14.5 % (ref 11.5–15.5)
WBC: 7.9 10*3/uL (ref 4.0–10.5)
nRBC: 0 % (ref 0.0–0.2)

## 2022-04-11 LAB — HIV ANTIBODY (ROUTINE TESTING W REFLEX): HIV Screen 4th Generation wRfx: NONREACTIVE

## 2022-04-11 NOTE — Progress Notes (Signed)
?PROGRESS NOTE ? ? ? ?Larry [name]  SWN:[id number] DOB: October 05, 1961 DOA: 04/09/2022 ?PCP: Roberts Gaudy., MD  ? 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  On presentation, he was saturating in the 80s on room air and required supplemental oxygen.  He had a temperature of 100.5 with tachycardia and tachypnea with possible right infrahilar infiltrate.  He was started on broad-spectrum antibiotics. ? ?Assessment & Plan: ?  ?Severe sepsis: Present on admission ?Community-acquired right lower lobe pneumonia ?Lactic acidosis: Resolved ?Acute respiratory failure with hypoxia ?Gram-positive cocci bacteremia: Possibly contaminant ?-Presented with fever, tachycardia, tachypnea with elevated lactic acid and acute respiratory failure with evidence of pneumonia ?-COVID-19 and influenza testing negative on admission.  Lactic acidosis has resolved.  Procalcitonin 0.9 on presentation.  Respiratory virus panel was positive for coronavirus NL 63. ?-Continue Rocephin and Zithromax.  Blood cultures growing gram-positive cocci in aerobic bottle of 1 set only; most likely contaminant ?-Currently still on 4 L oxygen via nasal cannula.  Wean off as able ? ?COPD with exacerbation ?-Continue nebs and Dulera.  Continue Solu-Medrol. ? ?Multiple sclerosis ?-Since 2018.  Actively being treated with Vumerity.  No evidence of MS flare at this time.  Outpatient follow-up with neurology ? ?Chronic systolic heart failure/chronic ischemic cardiomyopathy with history of AICD ?CAD with history of stent ?Hyperlipidemia ?-Currently compensated with no evidence  of chest pain. ?-Strict input and daily weights.  Fluid restriction.  Continue statin, Plavix, losartan.  Outpatient follow-up with cardiology ? ?Diabetes mellitus type 2 with diabetic polyneuropathy ?-A1c 5.8.  Continue CBGs with SSI.  Outpatient follow-up.  Carb modified diet ? ?Depression ?-Continue duloxetine, bupropion.  Outpatient follow-up ? ?Orthostatic hypotension ?Continue midodrine ? ?GERD ?-Continue Protonix ? ?OSA on CPAP ?-Continue CPAP at night ? ?Thrombocytopenia ?-longstanding chronic thrombocytopenia.  No signs of bleeding.  Monitor intermittently. ? ?Tobacco abuse ?-Ongoing.  Counseled regarding cessation ? ? ? ?DVT prophylaxis: Lovenox ?Code Status: Full ?Family Communication: Wife on phone  ?disposition Plan: ?Status is: Inpatient ?Remains inpatient appropriate because: Of severity of illness.  Need for IV antibiotics and steroids. ? ?Consultants: None ? ?Procedures: None ? ?Antimicrobials: Rocephin and Zithromax from 04/09/2022 onwards ? ? ?Subjective: ?Patient seen and examined at bedside.  No fever, nausea, vomiting, chest pain reported.  Still short of breath with exertion.  Feels only slightly better.  Oobjective: ?Vitals:  ? 04/10/22 2007 04/11/22 0139 04/11/22 0353 04/11/22 0446  ?BP:    116/69  ?Pulse:    76  ?Resp:    20  ?Temp:    98.3 ?F (36.8 ?C)  ?TempSrc:    Oral  ?SpO2: 94% 95%  95%  ?Weight:   111.1 kg   ?Height:      ? ? ?Intake/Output Summary (Last 24 hours) at 04/11/2022 0805 ?Last data filed at 04/11/2022 0600 ?Gross per 24 hour  ?Intake 1393 ml  ?Output 500 ml  ?Net 893 ml  ? ? ?Filed Weights  ? 04/10/22 0022 04/11/22 0353  ?Weight: 111.2 kg 111.1 kg  ? ? ?Examination: ?General: On 4 L oxygen via nasal cannula.  No distress ?ENT/neck: No thyromegaly.  JVD is not elevated  ?respiratory: Decreased breath sounds at bases bilaterally with  some crackles; no wheezing.  Intermittently tachypneic ?CVS: Currently rate controlled; S1-S2 heard  ?abdominal: Soft, nontender, slightly  distended; no organomegaly, bowel sounds are heard ?Extremities: Trace lower extremity edema; no cyanosis  ?CNS: Awake and alert.  No focal neurologic deficit.  Moves extremities ?Lymph: No obvious lymphadenopathy ?Skin: No obvious ecchymosis/lesions  ?psych: Mostly flat affect.  No signs of agitation.   ?Musculoskeletal: No obvious joint swelling/deformity ? ? ? ? ?Data Reviewed: I have personally reviewed following labs and imaging studies ? ?CBC: ?Recent Labs  ?Lab 04/09/22 ?1938 04/10/22 ?4103 04/11/22 ?0345  ?WBC 8.1 6.9 7.9  ?NEUTROABS 6.6 5.5 6.9  ?HGB 16.3 15.8 15.1  ?HCT 50.2 49.1 45.1  ?MCV 90.1 90.8 89.5  ?PLT 134* 129* 144*  ? ? ?Basic Metabolic Panel: ?Recent Labs  ?Lab 04/09/22 ?1938 04/10/22 ?0131 04/11/22 ?0345  ?NA 137 139 139  ?K 3.8 3.9 3.9  ?CL 104 107 108  ?CO2 23 22 24   ?GLUCOSE 190* 200* 181*  ?BUN 12 13 20   ?CREATININE 1.28* 1.20 1.17  ?CALCIUM 8.8* 8.5* 8.6*  ?MG  --  1.9 2.3  ? ? ?GFR: ?Estimated Creatinine Clearance: 83.8 mL/min (by C-G formula based on SCr of 1.17 mg/dL). ?Liver Function Tests: ?Recent Labs  ?Lab 04/09/22 ?1938 04/10/22 ?4388  ?AST 20 21  ?ALT 23 23  ?ALKPHOS 72 70  ?BILITOT 1.9* 1.6*  ?PROT 8.1 7.5  ?ALBUMIN 4.4 4.0  ? ? ?No results for input(s): LIPASE, AMYLASE in the last 168 hours. ?No results for input(s): AMMONIA in the last 168 hours. ?Coagulation Profile: ?Recent Labs  ?Lab 04/09/22 ?1938  ?INR 1.1  ? ? ?Cardiac Enzymes: ?No results for input(s): CKTOTAL, CKMB, CKMBINDEX, TROPONINI in the last 168 hours. ?BNP (last 3 results) ?No results for input(s): PROBNP in the last 8760 hours. ?HbA1C: ?Recent Labs  ?  04/10/22 ?8757  ?HGBA1C 5.8*  ? ? ?CBG: ?Recent Labs  ?Lab 04/10/22 ?0906 04/10/22 ?1145 04/10/22 ?1642 04/10/22 ?2004 04/11/22 ?0748  ?GLUCAP 159* 182* 167* 88 183*  ? ? ?Lipid Profile: ?No results for input(s): CHOL, HDL, LDLCALC, TRIG, CHOLHDL, LDLDIRECT in the last 72 hours. ?Thyroid Function Tests: ?No results for input(s): TSH, T4TOTAL, FREET4, T3FREE,  THYROIDAB in the last 72 hours. ?Anemia Panel: ?No results for input(s): VITAMINB12, FOLATE, FERRITIN, TIBC, IRON, RETICCTPCT in the last 72 hours. ?Sepsis Labs: ?Recent Labs  ?Lab 04/09/22 ?1938 04/09/22 ?2140  ?PROCALCITON 0.90  --   ?LATICACIDVEN 2.5* 1.8  ? ? ? ?Recent Results (from the past 240 hour(s))  ?Culture, blood (routine x 2)     Status: None (Preliminary result)  ? Collection Time: 04/09/22  7:38 PM  ? Specimen: BLOOD  ?Result Value Ref Range Status  ? Specimen Description   Final  ?  BLOOD RIGHT ANTECUBITAL ?Performed at [facility], [address], [place], [place] [address] ?  ? Special Requests   Final  ?  BOTTLES DRAWN AEROBIC ONLY Blood Culture adequate volume ?Performed at [facility], [address], [place], [place] [address] ?  ? Culture  Setup Time   Final  ?  GRAM POSITIVE COCCI ?AEROBIC BOTTLE ONLY ?CRITICAL RESULT CALLED TO, READ BACK BY AND VERIFIED WITH: PHARMD M. BELL [id number] @2121  FH ?Performed at [facility], [address] [place], [place] [address] ?  ? Culture PENDING  Incomplete  ? Report Status PENDING  Incomplete  ?Blood Culture ID Panel (Reflexed)     Status: Abnormal  ? Collection  Time: 04/09/22  7:38 PM  ?Result Value Ref Range Status  ? Enterococcus faecalis NOT DETECTED NOT DETECTED Final  ? Enterococcus Faecium NOT DETECTED NOT DETECTED Final  ? Listeria monocytogenes NOT DETECTED NOT DETECTED Final  ? Staphylococcus species DETECTED (A) NOT DETECTED Final  ?  Comment: CRITICAL RESULT CALLED TO, READ BACK BY AND VERIFIED WITH: ?PHARMD M. BELL [id number] @2121  FH ?  ? Staphylococcus aureus (BCID) NOT DETECTED NOT DETECTED Final  ? Staphylococcus epidermidis NOT DETECTED NOT DETECTED Final  ? Staphylococcus lugdunensis NOT DETECTED NOT DETECTED Final  ? Streptococcus species NOT DETECTED NOT DETECTED Final  ? Streptococcus agalactiae NOT DETECTED NOT DETECTED Final  ? Streptococcus pneumoniae NOT DETECTED NOT DETECTED Final  ?  Streptococcus pyogenes NOT DETECTED NOT DETECTED Final  ? A.calcoaceticus-baumannii NOT DETECTED NOT DETECTED Final  ? Bacteroides fragilis NOT DETECTED NOT DETECTED Final  ? Enterobacterales NOT DETECTED NOT DET

## 2022-04-11 NOTE — Progress Notes (Signed)
Patient is refusing to wear the CPAP tonight. He states that it is different from his home unit and he just cant tolerate the thing. Rt encouraged patient to call if he changes his mind. Machine on standby in patient room.  ?

## 2022-04-12 ENCOUNTER — Other Ambulatory Visit: Payer: Self-pay | Admitting: Cardiology

## 2022-04-12 DIAGNOSIS — A419 Sepsis, unspecified organism: Secondary | ICD-10-CM | POA: Diagnosis not present

## 2022-04-12 DIAGNOSIS — J189 Pneumonia, unspecified organism: Secondary | ICD-10-CM | POA: Diagnosis not present

## 2022-04-12 DIAGNOSIS — J441 Chronic obstructive pulmonary disease with (acute) exacerbation: Secondary | ICD-10-CM | POA: Diagnosis not present

## 2022-04-12 DIAGNOSIS — J9601 Acute respiratory failure with hypoxia: Secondary | ICD-10-CM | POA: Diagnosis not present

## 2022-04-12 LAB — GLUCOSE, CAPILLARY
Glucose-Capillary: 167 mg/dL — ABNORMAL HIGH (ref 70–99)
Glucose-Capillary: 243 mg/dL — ABNORMAL HIGH (ref 70–99)
Glucose-Capillary: 151 mg/dL — ABNORMAL HIGH (ref 70–99)
Glucose-Capillary: 235 mg/dL — ABNORMAL HIGH (ref 70–99)

## 2022-04-12 LAB — CBC WITH DIFFERENTIAL/PLATELET
Abs Immature Granulocytes: 0.06 10*3/uL (ref 0.00–0.07)
Basophils Absolute: 0 10*3/uL (ref 0.0–0.1)
Basophils Relative: 0 %
Eosinophils Absolute: 0 10*3/uL (ref 0.0–0.5)
Eosinophils Relative: 0 %
HCT: 46.2 % (ref 39.0–52.0)
Hemoglobin: 15.1 g/dL (ref 13.0–17.0)
Immature Granulocytes: 1 %
Lymphocytes Relative: 5 %
Lymphs Abs: 0.6 10*3/uL — ABNORMAL LOW (ref 0.7–4.0)
MCH: 29.4 pg (ref 26.0–34.0)
MCHC: 32.7 g/dL (ref 30.0–36.0)
MCV: 90.1 fL (ref 80.0–100.0)
Monocytes Absolute: 0.5 10*3/uL (ref 0.1–1.0)
Monocytes Relative: 5 %
Neutro Abs: 9.3 10*3/uL — ABNORMAL HIGH (ref 1.7–7.7)
Neutrophils Relative %: 89 %
Platelets: 158 10*3/uL (ref 150–400)
RBC: 5.13 MIL/uL (ref 4.22–5.81)
RDW: 14.6 % (ref 11.5–15.5)
WBC: 10.5 10*3/uL (ref 4.0–10.5)
nRBC: 0 % (ref 0.0–0.2)

## 2022-04-12 LAB — BASIC METABOLIC PANEL
Anion gap: 10 (ref 5–15)
BUN: 27 mg/dL — ABNORMAL HIGH (ref 6–20)
CO2: 23 mmol/L (ref 22–32)
Calcium: 8.7 mg/dL — ABNORMAL LOW (ref 8.9–10.3)
Chloride: 108 mmol/L (ref 98–111)
Creatinine, Ser: 1.13 mg/dL (ref 0.61–1.24)
GFR, Estimated: >60 mL/min (ref >60)
Glucose, Bld: 173 mg/dL — ABNORMAL HIGH (ref 70–99)
Potassium: 4 mmol/L (ref 3.5–5.1)
Sodium: 141 mmol/L (ref 135–145)

## 2022-04-12 LAB — CULTURE, BLOOD (ROUTINE X 2): Special Requests: ADEQUATE

## 2022-04-12 LAB — MAGNESIUM: Magnesium: 2.3 mg/dL (ref 1.7–2.4)

## 2022-04-12 MED ORDER — PREDNISONE 20 MG PO TABS
40.0000 mg | ORAL_TABLET | Freq: Every day | ORAL | Status: DC
  Administered 2022-04-13: 40 mg via ORAL
  Filled 2022-04-12: qty 2

## 2022-04-12 MED ORDER — IPRATROPIUM-ALBUTEROL 0.5-2.5 (3) MG/3ML IN SOLN
3.0000 mL | Freq: Three times a day (TID) | RESPIRATORY_TRACT | Status: DC
  Administered 2022-04-13: 3 mL via RESPIRATORY_TRACT
  Filled 2022-04-12: qty 3

## 2022-04-12 NOTE — Progress Notes (Signed)
Pt refused CPAP qhs.  Pt encouraged to contact RT should he change his mind.   

## 2022-04-12 NOTE — Progress Notes (Signed)
Patient ambulated approx 100 feet in hallway on RA.  Sats decreased to 88%, but maintaining in the 90's while at rest.  O2 removed while sitting up in chair, will continue to monitor ?

## 2022-04-12 NOTE — Progress Notes (Addendum)
?  Progress Note ? ? ?Patient: Larry [name] [id number] DOB: [birth year] DOA: 04/09/2022     3 ?DOS: the patient was seen and examined on 04/12/2022 ?  ?Brief hospital course: ?61 year old man presenting with cough, shortness of breath.  Admitted for sepsis secondary to pneumonia, COPD exacerbation, acute hypoxic respiratory failure. ? ?Assessment and Plan: ?* Sepsis due to pneumonia [facility]) ?--Sepsis resolved.  Continue treatment with empiric antibiotics.  Clinically improving. ? ?COPD with acute exacerbation ([place]) ?--Appears to be resolving at this point.. Taper steroid.  ? ?Acute respiratory failure with hypoxia ([place]) ?-Secondary to pneumonia now improving.  May be able to wean off. ? ?Multiple sclerosis ([place]) ?--since 2018, actively being treated with Vumerity ? ?Chronic systolic CHF (congestive heart failure) ([place]) ?--No clinical evidence of cardiogenic volume overload ? ?Coronary artery disease involving native coronary artery of native heart without angina pectoris ?--Continue home regimen of antiplatelet therapy, lipid lowering therapy ? ?Type 2 diabetes mellitus with diabetic polyneuropathy, without long-term current use of insulin ([place]) ?--stable, continue present management  ? ?OSA on CPAP ?Initiating home regimen of CPAP nightly. ? ?  ? ?Subjective:  ?Feels much better ?Breathing better ? ?Physical Exam: ?Vitals:  ? 04/12/22 0605 04/12/22 0757 04/12/22 1459 04/12/22 1528  ?BP: 118/70  114/68   ?Pulse: 85  85   ?Resp: 20  20   ?Temp: 98.2 ?F (36.8 ?C)  99.4 ?F (37.4 ?C)   ?TempSrc: Oral  Oral   ?SpO2: 95% 94% 95% 93%  ?Weight:      ?Height:      ? ?Physical Exam ?Vitals reviewed.  ?Constitutional:   ?   General: He is not in acute distress. ?   Appearance: He is not ill-appearing or toxic-appearing.  ?Cardiovascular:  ?   Rate and Rhythm: Normal rate and regular rhythm.  ?   Heart sounds: No murmur heard. ?Pulmonary:  ?   Effort: Pulmonary effort is normal. No respiratory distress.  ?   Breath sounds: No  wheezing, rhonchi or rales.  ?Neurological:  ?   Mental Status: He is alert.  ?Psychiatric:     ?   Mood and Affect: Mood normal.     ?   Behavior: Behavior normal.  ? ? ?Data Reviewed: ? ?CBG stable ?BMP noted ?CBC noted ? ?Family Communication: none ? ?Disposition: ?Status is: Inpatient ?Remains inpatient appropriate because: pneumonia ? Planned Discharge Destination: Home ? ? ? ?Time spent: 25 minutes ? ?Author: ?Murray Hodgkins, MD ?04/12/2022 6:25 PM ? ?For on call review [email or web address].  ?

## 2022-04-12 NOTE — Hospital Course (Addendum)
61 year old man presenting with cough, shortness of breath.  Admitted for sepsis secondary to pneumonia, COPD exacerbation, acute hypoxic respiratory failure. ? ?* Sepsis due to viral pneumonia ([place]) ?v ?  ?  ?COPD with acute exacerbation ([place]) ?Physical exam reveals diffuse wheezing with both inspiratory and expiratory wheezing and prolonged expiratory phase ?Patient is a longstanding history of smoking and still continues to smoke ?While patient reports never having been formally diagnosed with COPD in the past I believe patient is currently suffering from a COPD exacerbation now concurrently with his pneumonia ?Initiating aggressive bronchodilator therapy ?We will additionally initiate a modest regimen of steroid with 40 mg of prednisone daily ?  ?Acute respiratory failure with hypoxia ([place]) ?Notable acute hypoxic respiratory failure likely secondary to underlying pneumonia ?Suspect possible concurrent COPD exacerbation considering lung exam findings of diffuse wheezing with prolonged expiratory phase.  Patient reports he has never been formally diagnosed with COPD in the past. ?Patient was noted to be hypoxic at home with oxygen saturations in the 80s with EMS placing patient on 4 L of oxygen via nasal cannula ?Treating underlying pneumonia with intravenous antibiotics ?Treating with aggressive bronchodilator therapy and will add systemic steroids as well if patient does not rapidly improve ?We will wean supplemental oxygen as able ?  ?  ?Lactic acidosis ?Lactic acidosis likely secondary to sepsis with concurrent volume depletion ?Gently hydrating patient with intravenous isotonic fluids while monitoring for any evidence of volume overload considering patient's known history of systolic congestive heart failure. ?Monitoring serial lactic acid levels to ensure downtrending and resolution. ?  ?  ?Multiple sclerosis ([place]) ?Longstanding known history of multiple sclerosis since 2018 ?Actively being treated with  Vumerity ?No clinical evidence of concurrent MS flare at this time ?  ?Chronic systolic CHF (congestive heart failure) ([place]) ?No clinical evidence of cardiogenic volume overload ?  ?  ?Coronary artery disease involving native coronary artery of native heart without angina pectoris ?Patient is currently chest pain free ?Monitoring patient on telemetry ?Continue home regimen of antiplatelet therapy, lipid lowering therapy ?  ?  ?Thrombocytopenia ([place]) ?Longstanding chronic thrombocytopenia ?There is a risk that thrombocytopenia could worsen with concurrent sepsis ?No clinical evidence of bleeding ?Monitoring platelet counts with serial CBCs ?  ?Type 2 diabetes mellitus with diabetic polyneuropathy, without long-term current use of insulin ([place]) ?Patient been placed on Accu-Cheks before every meal and nightly with sliding scale insulin ?Holding home regimen of oral hypoglycemics ?Hemoglobin A1C ordered ?Diabetic Diet ?  ?  ?Mixed hyperlipidemia ?Continuing home regimen of lipid lowering therapy. ?  ?  ?GERD without esophagitis ?Continuing home regimen of daily PPI therapy. ?  ?  ?OSA on CPAP ?Initiating home regimen of CPAP nightly. ?  ?Major depressive disorder ?Continue home regimen of psychotropic regimen ?

## 2022-04-12 NOTE — TOC Initial Note (Signed)
Transition of Care (TOC) - Initial/Assessment Note  ? ? ?Patient Details  ?Name: Larry [name] ?MRN: [id number] ?Date of Birth: 12-Jun-1961 ? ?Transition of Care [facility]) CM/SW Contact:    ?Golda Acre, RN ?Phone Number: ?04/12/2022, 8:01 AM ? ?Clinical Narrative:                 ? ?Transition of Care (TOC) Screening Note ? ? ?Patient Details  ?Name: Larry [name] ?Date of Birth: 1961/06/03 ? ? ?Transition of Care [facility]) CM/SW Contact:    ?Golda Acre, RN ?Phone Number: ?04/12/2022, 8:01 AM ? ? ? ?Transition of Care Department [facility]) has reviewed patient and no TOC needs have been identified at this time. We will continue to monitor patient advancement through interdisciplinary progression rounds. If new patient transition needs arise, please place a TOC consult. ? ? ? ?Expected Discharge Plan: Home/Self Care ?Barriers to Discharge: No Barriers Identified ? ? ?Patient Goals and CMS Choice ?Patient states their goals for this hospitalization and ongoing recovery are:: to go home ?CMS Medicare.gov Compare Post Acute Care list provided to:: Patient ?  ? ?Expected Discharge Plan and Services ?Expected Discharge Plan: Home/Self Care ?  ?Discharge Planning Services: CM Consult ?  ?Living arrangements for the past 2 months: Single Family Home ?                ?  ?  ?  ?  ?  ?  ?  ?  ?  ?  ? ?Prior Living Arrangements/Services ?Living arrangements for the past 2 months: Single Family Home ?Lives with:: Spouse ?Patient language and need for interpreter reviewed:: Yes ?Do you feel safe going back to the place where you live?: Yes      ?  ?  ?  ?Criminal Activity/Legal Involvement Pertinent to Current Situation/Hospitalization: No - Comment as needed ? ?Activities of Daily Living ?Home Assistive Devices/Equipment: Eyeglasses ?ADL Screening (condition at time of admission) ?Patient's cognitive ability adequate to safely complete daily activities?: Yes ?Is the patient deaf or have difficulty hearing?: Yes ?Does the patient  have difficulty seeing, even when wearing glasses/contacts?: No ?Does the patient have difficulty concentrating, remembering, or making decisions?: No ?Patient able to express need for assistance with ADLs?: Yes ?Does the patient have difficulty dressing or bathing?: No ?Independently performs ADLs?: Yes (appropriate for developmental age) ?Does the patient have difficulty walking or climbing stairs?: No ?Weakness of Legs: None ?Weakness of Arms/Hands: None ? ?Permission Sought/Granted ?  ?  ?   ?   ?   ?   ? ?Emotional Assessment ?Appearance:: Appears stated age ?  ?  ?Orientation: : Oriented to Self, Oriented to Place, Oriented to  Time, Oriented to Situation ?Alcohol / Substance Use: Not Applicable ?Psych Involvement: No (comment) ? ?Admission diagnosis:  Shortness of breath [R06.02] ?Fever, unspecified fever cause [R50.9] ?Sepsis due to pneumonia (HCC) [J18.9, A41.9] ?Acute cough [R05.1] ?Patient Active Problem List  ? Diagnosis Date Noted  ? COPD with acute exacerbation (HCC) 04/10/2022  ? Sepsis due to pneumonia (HCC) 04/09/2022  ? Chronic systolic CHF (congestive heart failure) (HCC) 04/09/2022  ? Lactic acidosis 04/09/2022  ? Coronary artery disease involving native coronary artery of native heart without angina pectoris   ? Acute respiratory failure with hypoxia (HCC) 09/12/2020  ? GERD without esophagitis 09/12/2020  ? Multiple sclerosis (HCC) 09/12/2020  ? Major depressive disorder 09/12/2020  ? Mixed hyperlipidemia 09/12/2020  ? Peripheral polyneuropathy 09/12/2020  ? Type 2 diabetes mellitus with diabetic  polyneuropathy, without long-term current use of insulin (HCC) 09/12/2020  ? OSA on CPAP 09/12/2020  ? Tobacco abuse 09/12/2020  ? Syncope and collapse 09/12/2020  ? Cardiomyopathy (HCC) 09/12/2020  ? Cervical stenosis of spinal canal 08/28/2017  ? Hyponatremia 08/28/2017  ? Elevated LFTs 08/28/2017  ? Steroid-induced hyperglycemia 08/28/2017  ? DKA (diabetic ketoacidoses) 08/28/2017  ?  Thrombocytopenia (HCC) 08/28/2017  ? ?PCP:  Roberts Gaudy., MD ?Pharmacy:   ?[id number] [place], [place] - [address] AT [facility] [address] ?[address] ?[place] [address] ?Phone: [phone] Fax: [phone] ? ? ? ? ?Social Determinants of Health (SDOH) Interventions ?  ? ?Readmission Risk Interventions ?   ? View : No data to display.  ?  ?  ?  ? ? ? ?

## 2022-04-13 DIAGNOSIS — J9601 Acute respiratory failure with hypoxia: Secondary | ICD-10-CM | POA: Diagnosis not present

## 2022-04-13 DIAGNOSIS — A419 Sepsis, unspecified organism: Secondary | ICD-10-CM | POA: Diagnosis not present

## 2022-04-13 DIAGNOSIS — J189 Pneumonia, unspecified organism: Secondary | ICD-10-CM | POA: Diagnosis not present

## 2022-04-13 DIAGNOSIS — J441 Chronic obstructive pulmonary disease with (acute) exacerbation: Secondary | ICD-10-CM | POA: Diagnosis not present

## 2022-04-13 LAB — GLUCOSE, CAPILLARY
Glucose-Capillary: 120 mg/dL — ABNORMAL HIGH (ref 70–99)
Glucose-Capillary: 216 mg/dL — ABNORMAL HIGH (ref 70–99)

## 2022-04-13 MED ORDER — PREDNISONE 10 MG PO TABS
ORAL_TABLET | ORAL | 0 refills | Status: AC
Start: 2022-04-14 — End: 2022-04-20

## 2022-04-13 NOTE — Progress Notes (Signed)
SATURATION QUALIFICATIONS: (This note is used to comply with regulatory documentation for home oxygen) ? ?Patient Saturations on Room Air at Rest = 95% ? ?Patient Saturations on Room Air while Ambulating = 88-90% ? ?Patient Saturations on 2 Liters of oxygen while Ambulating = 93% ? ?Please briefly explain why patient needs home oxygen: ? Pt ambulated a distance of 115ft. Pt started to feel slightly winded and RN turns on 2L O2 and sats came to 93%. When pt came to a rest sats improved quickly. ?

## 2022-04-13 NOTE — TOC Progression Note (Addendum)
Transition of Care (TOC) - Progression Note  ? ? ?Patient Details  ?Name: Larry [name] ?MRN: [id number] ?Date of Birth: 1961-03-27 ? ?Transition of Care (TOC) CM/SW Contact  ?Golda Acre, RN ?Phone Number: ?04/13/2022, 9:04 AM ? ?Clinical Narrative:    ?O2 for home ordered through [employer]. ?02 delivered tot he room. ? ?Expected Discharge Plan: Home/Self Care ?Barriers to Discharge: No Barriers Identified ? ?Expected Discharge Plan and Services ?Expected Discharge Plan: Home/Self Care ?  ?Discharge Planning Services: CM Consult ?Post Acute Care Choice: Durable Medical Equipment ?Living arrangements for the past 2 months: Single Family Home ?                ?DME Arranged: Oxygen ?DME Agency: [employer] ?Date DME Agency Contacted: 04/13/22 ?Time DME Agency Contacted: [phone] ?Representative spoke with at DME Agency: zack ?  ?  ?  ?  ?  ? ? ?Social Determinants of Health (SDOH) Interventions ?  ? ?Readmission Risk Interventions ?   ? View : No data to display.  ?  ?  ?  ? ? ?

## 2022-04-13 NOTE — Discharge Summary (Signed)
?Physician Discharge Summary ?  ?Patient: Larry [name] MRN: [id number] DOB: 1961/02/01  ?Admit date:     04/09/2022  ?Discharge date: 04/13/22  ?Discharge Physician: Brendia Sacks  ? ?PCP: Roberts Gaudy., MD  ? ?Recommendations at discharge:  ? ?Follow-up resolution of viral pneumonia ?Suggest outpatient evaluation for suspected COPD ? ?Discharge Diagnoses: ?Principal Problem: ?  Sepsis due to pneumonia [facility]) ?Active Problems: ?  Acute respiratory failure with hypoxia (HCC) ?  COPD with acute exacerbation (HCC) ?  Lactic acidosis ?  Multiple sclerosis (HCC) ?  Chronic systolic CHF (congestive heart failure) (HCC) ?  Coronary artery disease involving native coronary artery of native heart without angina pectoris ?  Thrombocytopenia (HCC) ?  Type 2 diabetes mellitus with diabetic polyneuropathy, without long-term current use of insulin (HCC) ?  Mixed hyperlipidemia ?  GERD without esophagitis ?  Major depressive disorder ?  OSA on CPAP ? ?Resolved Problems: ?  * No resolved hospital problems. * ? ?Hospital Course: ?61 year old man presenting with cough, shortness of breath.  Admitted for sepsis secondary to pneumonia, COPD exacerbation, acute hypoxic respiratory failure. ? ?* Sepsis due to pneumonia (HCC) ?--viral panel: positive for coronavirus NL 63. ?--improved with conservative care; treated with empiric antibiotics ? ?Acute respiratory failure with hypoxia (HCC) ?-Secondary to pneumonia , plan home on oxygen ? ?COPD with acute exacerbation (HCC) ?--Appears resolved at this point. Taper steroid as outpatient. ?   ?Multiple sclerosis (HCC) ?--since 2018, actively being treated with Vumerity ?  ?Chronic systolic CHF (congestive heart failure) (HCC) ?--No clinical evidence of cardiogenic volume overload ?  ?Coronary artery disease involving native coronary artery of native heart without angina pectoris ?--Continue home regimen of antiplatelet therapy, lipid lowering therapy ?  ?Type 2 diabetes mellitus with  diabetic polyneuropathy, without long-term current use of insulin (HCC) ?--stable, continue present management  ?  ?OSA on CPAP ? ? ?  ? ? ?Consultants: none ?Procedures performed: none  ?Disposition: Home ?Diet recommendation:  ?Discharge Diet Orders (From admission, onward)  ? ?  Start     Ordered  ? 04/13/22 0000  Diet - low sodium heart healthy       ? 04/13/22 1129  ? ?  ?  ? ?  ? ?Regular diet ?DISCHARGE MEDICATION: ?Allergies as of 04/13/2022   ? ?   Reactions  ? Aspirin Other (See Comments)  ? Nose Bleeding (with high doses)  ? ?  ? ?  ?Medication List  ?  ? ?STOP taking these medications   ? ?HYDROcodone bit-homatropine 5-1.5 MG/5ML syrup ?Commonly known as: HYCODAN ?  ? ?  ? ?TAKE these medications   ? ?acetaminophen 500 MG tablet ?Commonly known as: TYLENOL ?Take 500 mg by mouth every 6 (six) hours as needed for mild pain, moderate pain or headache. ?  ?albuterol 108 (90 Base) MCG/ACT inhaler ?Commonly known as: VENTOLIN HFA ?Inhale 2 puffs into the lungs every 6 (six) hours as needed for wheezing or shortness of breath. ?  ?aspirin EC 81 MG tablet ?Take 81 mg by mouth daily. Swallow whole. ?  ?atorvastatin 80 MG tablet ?Commonly known as: Lipitor ?Take 1 tablet (80 mg total) by mouth daily. ?  ?buPROPion 150 MG 24 hr tablet ?Commonly known as: WELLBUTRIN XL ?Take 150 mg by mouth daily. ?  ?clopidogrel 75 MG tablet ?Commonly known as: Plavix ?Take 1 tablet (75 mg total) by mouth daily. ?  ?DAYQUIL MULTI-SYMPTOM COLD/FLU PO ?Take by mouth 2 (two) times daily as needed (cough,  upper respiratory symptoms). ?  ?DULoxetine 60 MG capsule ?Commonly known as: CYMBALTA ?Take 60 mg by mouth daily. ?  ?empagliflozin 10 MG Tabs tablet ?Commonly known as: Jardiance ?Take 1 tablet (10 mg total) by mouth daily before breakfast. ?  ?ezetimibe 10 MG tablet ?Commonly known as: ZETIA ?TAKE 1 TABLET(10 MG) BY MOUTH DAILY ?  ?levothyroxine 50 MCG tablet ?Commonly known as: SYNTHROID ?Take 50 mcg by mouth daily before  breakfast. ?  ?losartan 25 MG tablet ?Commonly known as: COZAAR ?Take by mouth. ?  ?methocarbamol 750 MG tablet ?Commonly known as: ROBAXIN ?Take 750 mg by mouth 3 (three) times daily. Scheduled ?  ?midodrine 5 MG tablet ?Commonly known as: PROAMATINE ?Take 1 tablet (5 mg total) by mouth 3 (three) times daily with meals. ?  ?multivitamin with minerals Tabs tablet ?Take 1 tablet by mouth daily. ?  ?nitroGLYCERIN 0.4 MG SL tablet ?Commonly known as: NITROSTAT ?Place 1 tablet (0.4 mg total) under the tongue every 5 (five) minutes as needed for chest pain. ?  ?pantoprazole 40 MG tablet ?Commonly known as: Protonix ?Take 1 tablet (40 mg total) by mouth daily. ?  ?predniSONE 10 MG tablet ?Commonly known as: DELTASONE ?Take 4 tablets (40 mg total) by mouth daily with breakfast for 2 days, THEN 2 tablets (20 mg total) daily with breakfast for 2 days, THEN 1 tablet (10 mg total) daily with breakfast for 2 days. ?Start taking on: April 14, 2022 ?  ?pregabalin 200 MG capsule ?Commonly known as: LYRICA ?Take 200 mg by mouth 3 (three) times daily. ?  ?Vumerity 231 MG Cpdr ?Generic drug: Diroximel Fumarate ?Take 462 mg by mouth in the morning and at bedtime. ?  ?zolpidem 10 MG tablet ?Commonly known as: AMBIEN ?Take 10 mg by mouth at bedtime. ?  ? ?  ? ?  ?  ? ? ?  ?Durable Medical Equipment  ?(From admission, onward)  ?  ? ? ?  ? ?  Start     Ordered  ? 04/13/22 3299  For home use only DME oxygen  Once       ?Question Answer Comment  ?Length of Need 6 Months   ?Mode or (Route) Nasal cannula   ?Liters per Minute 2   ?Frequency Continuous (stationary and portable oxygen unit needed)   ?Oxygen delivery system Gas   ?  ? 04/13/22 0821  ? ?  ?  ? ?  ? ? Follow-up Information   ? ? Roberts Gaudy., MD. Schedule an appointment as soon as possible for a visit in 1 week(s).   ?Specialty: Internal Medicine ?Contact information: ?[address] ?[place] [address] ?[phone] ? ? ?  ?  ? ?  ?  ? ?  ? ?Refused CPAP  overnight ?SpO2 dropped to 88% ambulating ?Feels good ? ?Discharge Exam: ?Filed Weights  ? 04/11/22 0353 04/12/22 0500 04/13/22 0335  ?Weight: 111.1 kg 109.5 kg 112.1 kg  ? ?Physical Exam ?Vitals reviewed.  ?Constitutional:   ?   General: He is not in acute distress. ?   Appearance: He is not ill-appearing or toxic-appearing.  ?Cardiovascular:  ?   Rate and Rhythm: Normal rate and regular rhythm.  ?   Heart sounds: No murmur heard. ?Pulmonary:  ?   Effort: Pulmonary effort is normal. No respiratory distress.  ?   Breath sounds: No wheezing, rhonchi or rales.  ?Neurological:  ?   Mental Status: He is alert.  ?Psychiatric:     ?  Mood and Affect: Mood normal.     ?   Behavior: Behavior normal.  ? ? ? ?Condition at discharge: good ? ?The results of significant diagnostics from this hospitalization (including imaging, microbiology, ancillary and laboratory) are listed below for reference.  ? ?Imaging Studies: ?DG Chest Port 1 View ? ?Result Date: 04/09/2022 ?CLINICAL DATA:  Cough short of breath EXAM: PORTABLE CHEST 1 VIEW COMPARISON:  10/28/2021 FINDINGS: Left-sided pacing device as before. Possible developing infiltrate in the right infrahilar lung. No pleural effusion. Normal cardiac size. No pneumothorax. Extensive hardware in the cervical spine IMPRESSION: Possible developing pneumonia in the right infrahilar lung. Electronically Signed   By: Jasmine Pang M.D.   On: 04/09/2022 20:05  ? ?ECHOCARDIOGRAM COMPLETE ? ?Result Date: 03/20/2022 ?   ECHOCARDIOGRAM REPORT   Patient Name:   ANURAAG TISSOT [facility] Date of Exam: 03/20/2022 Medical Rec #:  [id number]      Height:       70.0 in Accession #:    [id number]     Weight:       252.0 lb Date of Birth:  Aug 22, 1961      BSA:          2.303 m? Patient Age:    60 years       BP:           130/78 mmHg Patient Gender: M              HR:           60 bpm. Exam Location:  [address] Procedure: 2D Echo, Cardiac Doppler, Color Doppler and Intracardiac            Opacification Agent  Indications:    I50.22 CHF  History:        Patient has prior history of Echocardiogram examinations, most                 recent 07/21/2021. Cardiomyopathy, CAD, Signs/Symptoms:Syncope;                 Risk Factors:Sleep Apn

## 2022-04-25 LAB — CULTURE, BLOOD (ROUTINE X 2)

## 2022-05-12 ENCOUNTER — Other Ambulatory Visit (HOSPITAL_COMMUNITY): Payer: Self-pay | Admitting: Internal Medicine

## 2022-05-17 ENCOUNTER — Ambulatory Visit (INDEPENDENT_AMBULATORY_CARE_PROVIDER_SITE_OTHER): Payer: Medicare HMO

## 2022-05-17 DIAGNOSIS — I429 Cardiomyopathy, unspecified: Secondary | ICD-10-CM | POA: Diagnosis not present

## 2022-05-18 LAB — CUP PACEART REMOTE DEVICE CHECK
Battery Remaining Longevity: 180 mo
Battery Remaining Percentage: 100 %
Brady Statistic RV Percent Paced: 0 %
Date Time Interrogation Session: 20230531020200
HighPow Impedance: 104 Ohm
Implantable Lead Implant Date: 20220128
Implantable Lead Location: 753860
Implantable Lead Model: 673
Implantable Lead Serial Number: 151771
Implantable Pulse Generator Implant Date: 20220128
Lead Channel Impedance Value: 376 Ohm
Lead Channel Setting Pacing Amplitude: 2.5 V
Lead Channel Setting Pacing Pulse Width: 0.4 ms
Lead Channel Setting Sensing Sensitivity: 0.5 mV
Pulse Gen Serial Number: 289752

## 2022-05-30 NOTE — Progress Notes (Signed)
Remote ICD transmission.   

## 2022-07-03 ENCOUNTER — Other Ambulatory Visit (HOSPITAL_COMMUNITY): Payer: Self-pay | Admitting: Internal Medicine

## 2022-07-03 ENCOUNTER — Other Ambulatory Visit: Payer: Self-pay | Admitting: Cardiology

## 2022-08-16 ENCOUNTER — Ambulatory Visit: Payer: Medicare HMO

## 2022-08-24 ENCOUNTER — Encounter: Payer: Self-pay | Admitting: Cardiology

## 2022-08-24 ENCOUNTER — Ambulatory Visit: Payer: Medicare HMO | Attending: Cardiology | Admitting: Cardiology

## 2022-08-24 VITALS — BP 112/78 | HR 69 | Ht 70.0 in | Wt 243.6 lb

## 2022-08-24 DIAGNOSIS — I2510 Atherosclerotic heart disease of native coronary artery without angina pectoris: Secondary | ICD-10-CM | POA: Diagnosis not present

## 2022-08-24 DIAGNOSIS — I951 Orthostatic hypotension: Secondary | ICD-10-CM | POA: Diagnosis not present

## 2022-08-24 DIAGNOSIS — E782 Mixed hyperlipidemia: Secondary | ICD-10-CM | POA: Diagnosis not present

## 2022-08-24 DIAGNOSIS — I5022 Chronic systolic (congestive) heart failure: Secondary | ICD-10-CM

## 2022-08-24 DIAGNOSIS — I10 Essential (primary) hypertension: Secondary | ICD-10-CM

## 2022-08-24 NOTE — Patient Instructions (Signed)
Medication Instructions:  Your physician recommends that you continue on your current medications as directed. Please refer to the Current Medication list given to you today.  *If you need a refill on your cardiac medications before your next appointment, please call your pharmacy*  Follow-Up: At [facility], you and your health needs are our priority.  As part of our continuing mission to provide you with exceptional heart care, we have created designated Provider Care Teams.  These Care Teams include your primary Cardiologist (physician) and Advanced Practice Providers (APPs -  Physician Assistants and Nurse Practitioners) who all work together to provide you with the care you need, when you need it.  Your next appointment:   6 month(s)  The format for your next appointment:   In Person  Provider:   Chelsea Aus, PA-C, Jari Favre, PA-C, Ronie Spies, PA-C, Robin Searing, NP, Jacolyn Reedy, PA-C, or Eligha Bridegroom, NP      Important Information About Sugar

## 2022-08-24 NOTE — Progress Notes (Signed)
Cardiology Office Note   Date:  08/24/2022   ID:  Larry [name], DOB Nov 27, 1961, MRN [id number]  PCP:  Roberts Gaudy., MD  Cardiologist: Dr. Armanda Magic, MD  Chief Complaint  Patient presents with   Coronary Artery Disease   Hypertension   Hyperlipidemia   Congestive Heart Failure   Cardiomyopathy    History of Present Illness: Larry [name] is a 61 y.o. male  with hx of MS, OSA on CPAP, peripheral neuropathy, HTN with orthostatic hypotension, HLD, DM2,  tobacco use, and recent RSV  who recently underwent outpatient cardiac catheterization secondary to positive cardiac CTA showing total RCA disease, LAD with 50 to 69% stenosis, CT FFR across the lesion showing 0.61 plaque with high risk features.  LCx noted with a 50 to 69% stenosis and CT FFR at 0.76.  Given this, OP LHC was arranged.    He underwent cardiac catheterization 10/05/2020 which showed a chronic total occlusion or mRCA with right to right and left to right collaterals. Mid LAD lesion is 70% stenosed (DFR 0.85) s/p DES placement.  There was a 70% 2nd diag, vessel small for stent.  Plan for DAPT with ASA/Plavix for at least 6 months and consider Plavix as monotherapy after 6 months. The patient was seen by cardiac rehab while in short stay. There were no observed complications post cath. Radial cath site was re-evaluated prior to discharge and found to be stable without any complications. Instructions/precautions regarding cath site care were given prior to discharge.  He has a hx of syncope which prompted admission to hospital.  This occurred while standing emptying some buckets.  He then had another episode of syncope that occurred while sitting in his chair and suddenly had a syncopal episode with no prodrome.  It was felt that his dizziness and syncope were related to orthostatic hypotension and he has been on compression hose and midodrine.  He is followed by Dr. Gala Romney advanced heart failure clinic.  His losartan  had to be stopped due to soft blood pressures and orthostatic hypotension with dizziness.  He has not been on a beta-blocker as well.  He was discharged from advanced heart failure clinic as there was no other treatment options.  He does have an ICD in for dilated cardiomyopathy as well as ventricular tachycardia.  He is followed in EP clinic.  He is on midodrine 5 mg 3 times daily and compression hose for his orthostatic hypotension and dizziness.  He does not tolerate ACE/ARB/ARNi or diuretics due to his orthostatic hypotension.   He is here today for followup and is doing well. He has chronic DOE that he thinks is better.  He has started working out in the yard and has been getting more exercise which he thinks has help.  He still wears out and has to sit when it is hot.  He denies any chest pain or pressure, PND, orthopnea, LE edema, palpitations or syncope. His dizzy spells have significant improved.  He is compliant with his meds and is tolerating meds with no SE.    Past Medical History:  Diagnosis Date   Arthritis    Chronic systolic CHF (congestive heart failure) (HCC) 04/09/2022   Coronary artery disease involving native coronary artery of native heart without angina pectoris    GERD without esophagitis 09/12/2020   Major depressive disorder 09/12/2020   Mixed hyperlipidemia 09/12/2020   Multiple sclerosis (HCC) 09/12/2020   Nerve pain    Thrombocytopenia (HCC) 08/28/2017  Past Surgical History:  Procedure Laterality Date   CORONARY STENT INTERVENTION N/A 10/05/2020   Procedure: CORONARY STENT INTERVENTION;  Surgeon: Corky Crafts, MD;  Location: [facility] INVASIVE CV LAB;  Service: Cardiovascular;  Laterality: N/A;  LAD   ICD IMPLANT N/A 01/14/2021   Procedure: ICD IMPLANT;  Surgeon: Lanier Prude, MD;  Location: [facility] INVASIVE CV LAB;  Service: Cardiovascular;  Laterality: N/A;   INTRAVASCULAR PRESSURE WIRE/FFR STUDY N/A 10/05/2020   Procedure: INTRAVASCULAR PRESSURE WIRE/FFR STUDY;   Surgeon: Corky Crafts, MD;  Location: [facility] INVASIVE CV LAB;  Service: Cardiovascular;  Laterality: N/A;   INTRAVASCULAR ULTRASOUND/IVUS N/A 10/05/2020   Procedure: Intravascular Ultrasound/IVUS;  Surgeon: Corky Crafts, MD;  Location: [facility] INVASIVE CV LAB;  Service: Cardiovascular;  Laterality: N/A;  LAD   LEFT HEART CATH AND CORONARY ANGIOGRAPHY N/A 10/05/2020   Procedure: LEFT HEART CATH AND CORONARY ANGIOGRAPHY;  Surgeon: Corky Crafts, MD;  Location: [facility] INVASIVE CV LAB;  Service: Cardiovascular;  Laterality: N/A;     Current Outpatient Medications  Medication Sig Dispense Refill   acetaminophen (TYLENOL) 500 MG tablet Take 500 mg by mouth every 6 (six) hours as needed for mild pain, moderate pain or headache.     albuterol (VENTOLIN HFA) 108 (90 Base) MCG/ACT inhaler Inhale 2 puffs into the lungs every 6 (six) hours as needed for wheezing or shortness of breath. 8 g 2   aspirin EC 81 MG tablet Take 81 mg by mouth daily. Swallow whole.     atorvastatin (LIPITOR) 80 MG tablet Take 1 tablet (80 mg total) by mouth daily. 90 tablet 3   clopidogrel (PLAVIX) 75 MG tablet Take 1 tablet (75 mg total) by mouth daily. 90 tablet 3   Diroximel Fumarate (VUMERITY) 231 MG CPDR Take 462 mg by mouth in the morning and at bedtime.      DULoxetine (CYMBALTA) 60 MG capsule Take 60 mg by mouth daily.  5   ezetimibe (ZETIA) 10 MG tablet TAKE 1 TABLET(10 MG) BY MOUTH DAILY 90 tablet 3   JARDIANCE 10 MG TABS tablet TAKE 1 TABLET(10 MG) BY MOUTH DAILY BEFORE BREAKFAST 30 tablet 11   methocarbamol (ROBAXIN) 750 MG tablet Take 750 mg by mouth 3 (three) times daily. Scheduled     midodrine (PROAMATINE) 5 MG tablet TAKE 1 TABLET(5 MG) BY MOUTH THREE TIMES DAILY WITH MEALS 270 tablet 3   Multiple Vitamin (MULTIVITAMIN WITH MINERALS) TABS tablet Take 1 tablet by mouth daily.     pantoprazole (PROTONIX) 40 MG tablet Take 1 tablet (40 mg total) by mouth daily. 90 tablet 3   pregabalin (LYRICA) 200 MG  capsule Take 200 mg by mouth 3 (three) times daily.     Pseudoephedrine-APAP-DM (DAYQUIL MULTI-SYMPTOM COLD/FLU PO) Take by mouth 2 (two) times daily as needed (cough, upper respiratory symptoms).     zolpidem (AMBIEN) 10 MG tablet Take 10 mg by mouth at bedtime.     nitroGLYCERIN (NITROSTAT) 0.4 MG SL tablet Place 1 tablet (0.4 mg total) under the tongue every 5 (five) minutes as needed for chest pain. 25 tablet 3   No current facility-administered medications for this visit.    Allergies:   Aspirin    Social History:  The patient  reports that he has been smoking cigarettes. He has never used smokeless tobacco. He reports that he does not drink alcohol and does not use drugs.   Family History:  The patient's family history includes Heart attack (age of onset: 53) in his  mother.    ROS:  Please see the history of present illness. Otherwise, review of systems are positive for none.   All other systems are reviewed and negative.    PHYSICAL EXAM: VS:  BP 112/78   Pulse 69   Ht 5\' 10"  (1.778 m)   Wt 243 lb 9.6 oz (110.5 kg)   SpO2 97%   BMI 34.95 kg/m  , BMI Body mass index is 34.95 kg/m.   GEN: Well nourished, well developed in no acute distress HEENT: Normal NECK: No JVD; No carotid bruits LYMPHATICS: No lymphadenopathy CARDIAC:RRR, no murmurs, rubs, gallops RESPIRATORY:  Clear to auscultation without rales, wheezing or rhonchi  ABDOMEN: Soft, non-tender, non-distended MUSCULOSKELETAL:  No edema; No deformity  SKIN: Warm and dry NEUROLOGIC:  Alert and oriented x 3 PSYCHIATRIC:  Normal affect EKG:  EKG is not ordered today   Recent Labs: 04/09/2022: B Natriuretic Peptide 66.5 04/10/2022: ALT 23 04/12/2022: BUN 27; Creatinine, Ser 1.13; Hemoglobin 15.1; Magnesium 2.3; Platelets 158; Potassium 4.0; Sodium 141    Lipid Panel    Component Value Date/Time   CHOL 109 02/24/2022 1212   TRIG 92 02/24/2022 1212   HDL 38 (L) 02/24/2022 1212   CHOLHDL 2.9 02/24/2022 1212    LDLCALC 53 02/24/2022 1212      Wt Readings from Last 3 Encounters:  08/24/22 243 lb 9.6 oz (110.5 kg)  04/13/22 247 lb 2.2 oz (112.1 kg)  03/06/22 252 lb (114.3 kg)    Other studies Reviewed: Additional studies/ records that were reviewed today include:  Review of the above records demonstrates:    Cardiac Catheterization 10/05/2020:   CORONARY STENT INTERVENTION  INTRAVASCULAR PRESSURE WIRE/FFR STUDY  Intravascular Ultrasound/IVUS  LEFT HEART CATH AND CORONARY ANGIOGRAPHY  Conclusion     Mid RCA lesion is 100% stenosed. Chronic total occlusion. Right to right and left to right collaterals. Mid LAD lesion is 70% stenosed. DFR 0.85. A drug-eluting stent was successfully placed using a SYNERGY XD 3.0X28. Post intervention, there is a 0% residual stenosis. 2nd Diag lesion is 70% stenosed. This is a small branch vessel, too small for stent. There is moderate left ventricular systolic dysfunction. The left ventricular ejection fraction is 35-45% by visual estimate. LV end diastolic pressure is normal. There is no aortic valve stenosis.   Continue aggressive secondary prevention.  Consider clopidogrel monotherapy after 6 months given other CAD.   Medical therapy for LV dysfunction.  EF should improve with this revascularization since flow to anterior wall and collaterals to RCA has improved.    Diagnostic Dominance: Right  Intervention     ASSESSMENT AND PLAN:  1. CAD s/p PCI to mLAD: -LHC performed 10/05/2020 which showed a chronic total occlusion or mRCA with right to right and left to right collaterals. Mid LAD lesion is 70% stenosed (DFR 0.85) s/p DES placement.  There was a 70% 2nd diag, vessel small for stent.  -Lexiscan Myoview done for chest pain on 03/06/2022 showed fixed apical to basal inferior and inferior septal perfusion defect with hypokinesis consistent with prior infarct and no ischemia.  LV function was reduced to 26% -He has not had any further chest pain   -Continue prescription drug management with ASA 81 mg daily, Plavix 75 mg daily and high-dose statin therapy  with PRN refills -no BB due to orthostatic hypotension  2. HLD: -LDL goal < 70 -I have personally reviewed and interpreted outside labs performed by patient's PCP which showed LDL 53 and HDL 38 on 02/24/2022 -  Continue prescription drug management atorvastatin 80 mg daily and Zetia 10 mg daily with as needed refills  3. HTN: -BP is controlled on exam today -He is off ARB due to dizziness  4.  Syncope/NSVT/Orthostatic Hypotension/dizziness -he has had several episdoes of syncope prior to his heart cath. The first episode occurred while standing up emptying some buckets.  The other episode occurred when he was sitting in a chair and just fell out with no prodrome. -event monitor showed episodes of NSVT but no bradyarrhythmias -He has had an extensive evaluation by neurology with no revealing etiology. -Losartan was recently stopped to allow higher blood pressure and compression hose were added and he was continued on midodrine 5 mg 3 times daily -He has not had any further dizziness, presyncope or syncope -Continue prescription drug management with midodrine 5 mg 3 times daily with as needed refills  5.  Chronic Systolic Heart Failure -CMRI 11/2020 -LVEF is severely reduced at 30% and there is significant burden of transmural myocardial scarring (poor chance of recovery) in 7 segments (~ 40% of left ventricular myocardium).  - 12/2020 [employer] ICD placed. - Echo 07/2021 - EF 30-35% , HK LV, RV normal - NYHA III predominantly related to dizziness - No ACE/ARB/beta-blocker due to orthostatic hypotension and dizziness - Continue drug management with Jardiance 10 mg daily with as needed refills - Unable to initiate additional GDMT with need for midodrine and profound dizziness.  - He is not candidate for advanced therapies. - He has been released from advanced heart failure  clinic due to no other options available for treatment of his heart failure  Followup with me in 6 months  Signed, Armanda Magic, MD  08/24/2022 11:36 AM    [facility] [address] [place], [place], [place]  [address] Phone: [phone]; Fax: [phone]

## 2022-10-14 ENCOUNTER — Telehealth: Payer: Self-pay | Admitting: Student

## 2022-10-14 NOTE — Telephone Encounter (Signed)
   The patient's wife called the after hours line reporting he has been having cold chills and a cold sweat since yesterday. They have not checked his temperature and are currently unable to locate a thermometer. He does use his inhaler but denies any acute changes in dyspnea. No palpitations or chest pain. He does not weigh regularly but did weigh while on the phone and at 240 lbs which is close to his baseline. Vitals checked and BP at 156/84 and HR 66.  They are unaware of any sick contacts but do have grandchildren in and out of the home. We reviewed that his current symptoms seem atypical for a cardiac etiology. Possibly due to a viral illness or other cause. At this time, they plan to monitor symptoms and he will go to an Urgent Care if symptoms worsen. Also encouraged them to check his temperature if able. His wife voiced understanding of this and was appreciative of the return call.   Signed, Erma Heritage, PA-C 10/14/2022, 9:38 AM

## 2022-11-15 ENCOUNTER — Ambulatory Visit (INDEPENDENT_AMBULATORY_CARE_PROVIDER_SITE_OTHER): Payer: Medicare HMO

## 2022-11-15 DIAGNOSIS — I429 Cardiomyopathy, unspecified: Secondary | ICD-10-CM | POA: Diagnosis not present

## 2022-11-15 LAB — CUP PACEART REMOTE DEVICE CHECK
Battery Remaining Longevity: 180 mo
Battery Remaining Percentage: 100 %
Brady Statistic RV Percent Paced: 0 %
Date Time Interrogation Session: 20231129015100
HighPow Impedance: 100 Ohm
Implantable Lead Connection Status: 753985
Implantable Lead Implant Date: 20220128
Implantable Lead Location: 753860
Implantable Lead Model: 673
Implantable Lead Serial Number: 151771
Implantable Pulse Generator Implant Date: 20220128
Lead Channel Impedance Value: 377 Ohm
Lead Channel Setting Pacing Amplitude: 2.5 V
Lead Channel Setting Pacing Pulse Width: 0.4 ms
Lead Channel Setting Sensing Sensitivity: 0.5 mV
Pulse Gen Serial Number: 289752
Zone Setting Status: 755011

## 2022-12-13 NOTE — Progress Notes (Signed)
Remote ICD transmission.   

## 2023-02-14 ENCOUNTER — Ambulatory Visit: Payer: Medicare HMO

## 2023-02-14 DIAGNOSIS — I429 Cardiomyopathy, unspecified: Secondary | ICD-10-CM

## 2023-02-14 LAB — CUP PACEART REMOTE DEVICE CHECK
Battery Remaining Longevity: 174 mo
Battery Remaining Percentage: 100 %
Brady Statistic RV Percent Paced: 0 %
Date Time Interrogation Session: 20240228015100
HighPow Impedance: 95 Ohm
Implantable Lead Connection Status: 753985
Implantable Lead Implant Date: 20220128
Implantable Lead Location: 753860
Implantable Lead Model: 673
Implantable Lead Serial Number: 151771
Implantable Pulse Generator Implant Date: 20220128
Lead Channel Impedance Value: 372 Ohm
Lead Channel Setting Pacing Amplitude: 2.5 V
Lead Channel Setting Pacing Pulse Width: 0.4 ms
Lead Channel Setting Sensing Sensitivity: 0.5 mV
Pulse Gen Serial Number: 289752
Zone Setting Status: 755011

## 2023-03-20 NOTE — Progress Notes (Signed)
Remote ICD transmission.   

## 2023-04-15 ENCOUNTER — Other Ambulatory Visit (HOSPITAL_COMMUNITY): Payer: Self-pay | Admitting: Cardiology

## 2023-05-15 NOTE — Progress Notes (Addendum)
Cardiology Office Note    Date:  05/15/2023   ID:  Larry [name], DOB [birth year], MRN [id number]  PCP:  Roberts Gaudy., MD  Cardiologist:  Armanda Magic, MD  Electrophysiologist:  Lanier Prude, MD   Chief Complaint: Follow up for chronic HFrEF  History of Present Illness:   Larry [name] is a 62 y.o. male with history of MS, CAD, chronic HFrEF, OSA on CPAP, peripheral neuropathy, HTN with orthostatic hypotension (prior syncope felt related to this), NSVT, mildly dilated aortic root by echo 03/2022, HLD, DM2, tobacco abuse who is seen for follow-up. He was initially seen by our team in 2021 when found to have hypoxia in setting of RSV infection, LVEF 40-45%, and syncope ultimately felt due to orthostasis. Cor CT was abnormal prompting cath 10/05/20 showing CTO of RCA with R-R and L-R collaterals, 70% mLAD s/p DES, 70% D2 too small for stent, LVEF 35-45%. OP monitor showed HR 56-134bpm, avg 75bpm, occasional PVC, 5 beat run NSVT. cMRI 11/2020 showed LVEF 30% with significant burden of transmural myocardial scarring (poor chance of recovery) in 7 segments (~ 40% of left ventricular myocardium). Underwent [employer] ICD in 12/2020 followed by Dr. Lalla Brothers. He was also seen previously by Dr. Gala Romney with AHF clinic, not on losartan or BB with soft BP. He was discharged from advanced heart failure clinic as there were no other treatment options. Repeat nuc 02/2022 showed infarct but no ischemia. Last echo 03/2022 showed EF 30-35%, mild LV dilation, normal RV, mild dilation of aortic root 38mm. He was last seen by Dr. Mayford Knife 08/2022 and felt to be clinically stable.  Today he reports he had been doing well overall, except having a GI illness for the last two weeks. He reports he had nausea, vomiting and diarrhea for one week, followed by a week with little to no appetite. His appetite returned last night. Prior to this he felt he had been doing well. Was at his baseline for dyspnea on exertion, had no  shortness of breath at rest. Denies chest pain, syncopal episodes or lower extremity edema. He reported one episode of dizziness last week, but has seen an improvement in this in the last few months. He wears his CPAP nightly and walks with a cane.   Labwork independently reviewed: 02/2023 LFTs ok, Hgb 16.7, plt 149, A1c 6.2 03/2022 K 4.0, Cr 1.13, Mg 2.3 02/2022 trig 92, LDL 53  Past History   Past Medical History:  Diagnosis Date   Arthritis    Chronic systolic CHF (congestive heart failure) (HCC) 04/09/2022   Coronary artery disease involving native coronary artery of native heart without angina pectoris    GERD without esophagitis 09/12/2020   Major depressive disorder 09/12/2020   Mixed hyperlipidemia 09/12/2020   Multiple sclerosis (HCC) 09/12/2020   Nerve pain    Thrombocytopenia (HCC) 08/28/2017    Past Surgical History:  Procedure Laterality Date   CORONARY PRESSURE/FFR STUDY N/A 10/05/2020   Procedure: INTRAVASCULAR PRESSURE WIRE/FFR STUDY;  Surgeon: Corky Crafts, MD;  Location: [facility] INVASIVE CV LAB;  Service: Cardiovascular;  Laterality: N/A;   CORONARY STENT INTERVENTION N/A 10/05/2020   Procedure: CORONARY STENT INTERVENTION;  Surgeon: Corky Crafts, MD;  Location: [facility] INVASIVE CV LAB;  Service: Cardiovascular;  Laterality: N/A;  LAD   CORONARY ULTRASOUND/IVUS N/A 10/05/2020   Procedure: Intravascular Ultrasound/IVUS;  Surgeon: Corky Crafts, MD;  Location: [facility] INVASIVE CV LAB;  Service: Cardiovascular;  Laterality: N/A;  LAD   ICD  IMPLANT N/A 01/14/2021   Procedure: ICD IMPLANT;  Surgeon: Lanier Prude, MD;  Location: [facility] INVASIVE CV LAB;  Service: Cardiovascular;  Laterality: N/A;   LEFT HEART CATH AND CORONARY ANGIOGRAPHY N/A 10/05/2020   Procedure: LEFT HEART CATH AND CORONARY ANGIOGRAPHY;  Surgeon: Corky Crafts, MD;  Location: [facility] INVASIVE CV LAB;  Service: Cardiovascular;  Laterality: N/A;    Current Medications: Current Meds  Medication Sig    acetaminophen (TYLENOL) 500 MG tablet Take 500 mg by mouth every 6 (six) hours as needed for mild pain, moderate pain or headache.   albuterol (VENTOLIN HFA) 108 (90 Base) MCG/ACT inhaler Inhale 2 puffs into the lungs every 6 (six) hours as needed for wheezing or shortness of breath.   aspirin EC 81 MG tablet Take 81 mg by mouth daily. Swallow whole.   atorvastatin (LIPITOR) 80 MG tablet Take 1 tablet (80 mg total) by mouth daily.   Cholecalciferol 125 MCG (5000 UT) TABS Take by mouth daily.   clopidogrel (PLAVIX) 75 MG tablet Take 1 tablet (75 mg total) by mouth daily.   cyanocobalamin (VITAMIN B12) 1000 MCG tablet Take by mouth daily.   Diroximel Fumarate (VUMERITY) 231 MG CPDR Take 462 mg by mouth in the morning and at bedtime.    DULoxetine (CYMBALTA) 60 MG capsule Take 60 mg by mouth daily.   ezetimibe (ZETIA) 10 MG tablet TAKE 1 TABLET(10 MG) BY MOUTH DAILY   JARDIANCE 10 MG TABS tablet TAKE 1 TABLET(10 MG) BY MOUTH DAILY BEFORE BREAKFAST   methocarbamol (ROBAXIN) 750 MG tablet Take 750 mg by mouth 3 (three) times daily. Scheduled   midodrine (PROAMATINE) 5 MG tablet TAKE 1 TABLET(5 MG) BY MOUTH THREE TIMES DAILY WITH MEALS   Multiple Vitamin (MULTIVITAMIN WITH MINERALS) TABS tablet Take 1 tablet by mouth daily.   pantoprazole (PROTONIX) 40 MG tablet Take 1 tablet (40 mg total) by mouth daily.   pregabalin (LYRICA) 200 MG capsule Take 200 mg by mouth 3 (three) times daily.   Pseudoephedrine-APAP-DM (DAYQUIL MULTI-SYMPTOM COLD/FLU PO) Take by mouth 2 (two) times daily as needed (cough, upper respiratory symptoms).   WIXELA INHUB 100-50 MCG/ACT AEPB Inhale into the lungs as needed (copd).   zolpidem (AMBIEN) 10 MG tablet Take 10 mg by mouth at bedtime.    Allergies:   Aspirin   Social History   Socioeconomic History   Marital status: Married    Spouse name: Not on file   Number of children: Not on file   Years of education: Not on file   Highest education level: Not on file   Occupational History   Not on file  Tobacco Use   Smoking status: Every Day    Types: Cigarettes   Smokeless tobacco: Never  Substance and Sexual Activity   Alcohol use: No   Drug use: No   Sexual activity: Not on file  Other Topics Concern   Not on file  Social History Narrative   Not on file   Social Determinants of Health   Financial Resource Strain: Not on file  Food Insecurity: Not on file  Transportation Needs: Not on file  Physical Activity: Not on file  Stress: Not on file  Social Connections: Not on file     Family History:  The patient's family history includes Heart attack (age of onset: 54) in his mother. There is no history of Lung cancer.  ROS:   Please see the history of present illness.  All other systems are reviewed and  otherwise negative.    EKG(s)/Additional Testing   EKG:  EKG is ordered today, personally reviewed, demonstrating normal sinus rhythm at 73bpm. Nonspecific STTW changes. Possible prior infarct. No acute change from prior.   CV Studies: Cardiac studies reviewed are outlined and summarized above. Otherwise please see EMR for full report.  Recent Labs: 02/2023 LFTs ok, Hgb 16.7, plt 149, A1c 6.2 03/2022 K 4.0, Cr 1.13, Mg 2.3 02/2022 trig 92, LDL 53  Recent Lipid Panel    Component Value Date/Time   CHOL 109 02/24/2022 1212   TRIG 92 02/24/2022 1212   HDL 38 (L) 02/24/2022 1212   CHOLHDL 2.9 02/24/2022 1212   LDLCALC 53 02/24/2022 1212   PHYSICAL EXAM:    VS:  BP 120/78   Pulse 73   Ht 5\' 10"  (1.778 m)   Wt 230 lb (104.3 kg)   SpO2 97%   BMI 33.00 kg/m   BMI: Body mass index is 33 kg/m.  GEN: Well nourished, well developed male in no acute distress HEENT: normocephalic, atraumatic Neck: no JVD, carotid bruits, or masses Cardiac: RRR; no murmurs, rubs, or gallops, no edema  Respiratory:  clear to auscultation bilaterally, normal work of breathing GI: soft, nontender, nondistended, + BS MS: no deformity or  atrophy Skin: warm and dry, no rash Neuro:  Alert and Oriented x 3, Strength and sensation are intact, follows commands Psych: euthymic mood, full affect  Wt Readings from Last 3 Encounters:  05/17/23 230 lb (104.3 kg)  08/24/22 243 lb 9.6 oz (110.5 kg)  04/13/22 247 lb 2.2 oz (112.1 kg)     ASSESSMENT & PLAN:   Chronic HFrEF: Last echo 4/23 indicated EF 30-35%. Today appears well, no shortness of breath at rest and no lower extremity edema is present. He is not on ACE/ARB/beta blocker due to dizziness and orthostatic hypotension, he is currently taking midodrine three times a day. Notes he has had improvement in dizziness. Previously released from advanced heart failure clinic due to no other options for his heart failure treatment. Has been tolerating Jardiance 10mg  daily, will continue if CMET returns WNL. Will obtain CMET to assess electrolyte status and kidney function post GI illness.   CAD: PCI to mLAD in 2021, chronic occlusion of mRCA noted with collaterals present. Lexiscan Myoview done for chest pain on 03/06/2022 showed fixed apical to basal inferior and inferior septal perfusion defect with hypokinesis consistent with prior infarct and no ischemia. LV function was reduced to 26%. Today reports no further chest pain. Continue ASA 81mg  daily, Plavix 75mg  daily and atorvastatin 80mg  daily. Will obtain lipid panel today. It appears that he has been maintained on chronic DAPT over the last several years. Will reach out to Dr. Mayford Knife to verify the plan is to continue long term approach. ADDENDUM 05/21/23: Per Dr. Mayford Knife patient to continue DAPT indefinitely.   Dilation of aortic root: Last echocardiogram on 03/2022 indicated mild dilation of aortic root at 38mm. Will obtain repeat echo to assess aortic root dilation.   Orthostatic hypotension/dizziness/NSVT: : Reports one episode of dizziness last week while sick with GI illness. States before then had seen improvement in dizziness. Denies  palpitations. Continue midodrine 5 mg three times a day. Labs as above.  OSA: using CPAP but not being actively followed by anyone. Will have RN send sleep team Coralee [place]) a message to inquire about following with Dr. Mayford Knife for this, particularly whether updated sleep study is needed before that encounter.    Disposition: F/u in 6  months with Dr. Mayford Knife for gen cards follow-up but may be sooner if sleep team recommends moving forward with sleep study + OSA f/u. Follow up for ICD with Dr. Lalla Brothers (overdue, last seen 2022).  Medication Adjustments/Labs and Tests Ordered: Current medicines are reviewed at length with the patient today.  Concerns regarding medicines are outlined above. Medication changes, Labs and Tests ordered today are summarized above and listed in the Patient Instructions accessible in Encounters.   Signed, Rip Harbour, NP  05/15/2023 4:45 PM    [facility] Phone: [phone]; Fax: [phone]

## 2023-05-16 ENCOUNTER — Ambulatory Visit (INDEPENDENT_AMBULATORY_CARE_PROVIDER_SITE_OTHER): Payer: Medicare HMO

## 2023-05-16 DIAGNOSIS — I429 Cardiomyopathy, unspecified: Secondary | ICD-10-CM

## 2023-05-17 ENCOUNTER — Ambulatory Visit: Payer: Medicare HMO | Attending: Physician Assistant | Admitting: Cardiology

## 2023-05-17 ENCOUNTER — Encounter: Payer: Self-pay | Admitting: Cardiology

## 2023-05-17 VITALS — BP 120/78 | HR 73 | Ht 70.0 in | Wt 230.0 lb

## 2023-05-17 DIAGNOSIS — I77810 Thoracic aortic ectasia: Secondary | ICD-10-CM | POA: Diagnosis not present

## 2023-05-17 DIAGNOSIS — I5022 Chronic systolic (congestive) heart failure: Secondary | ICD-10-CM

## 2023-05-17 DIAGNOSIS — I2510 Atherosclerotic heart disease of native coronary artery without angina pectoris: Secondary | ICD-10-CM

## 2023-05-17 DIAGNOSIS — I4729 Other ventricular tachycardia: Secondary | ICD-10-CM

## 2023-05-17 DIAGNOSIS — I951 Orthostatic hypotension: Secondary | ICD-10-CM

## 2023-05-17 DIAGNOSIS — E782 Mixed hyperlipidemia: Secondary | ICD-10-CM

## 2023-05-17 LAB — CUP PACEART REMOTE DEVICE CHECK
Battery Remaining Longevity: 162 mo
Battery Remaining Percentage: 100 %
Brady Statistic RV Percent Paced: 0 %
Date Time Interrogation Session: 20240529015200
HighPow Impedance: 107 Ohm
Implantable Lead Connection Status: 753985
Implantable Lead Implant Date: 20220128
Implantable Lead Location: 753860
Implantable Lead Model: 673
Implantable Lead Serial Number: 151771
Implantable Pulse Generator Implant Date: 20220128
Lead Channel Impedance Value: 399 Ohm
Lead Channel Setting Pacing Amplitude: 2.5 V
Lead Channel Setting Pacing Pulse Width: 0.4 ms
Lead Channel Setting Sensing Sensitivity: 0.5 mV
Pulse Gen Serial Number: 289752
Zone Setting Status: 755011

## 2023-05-17 NOTE — Progress Notes (Deleted)
Cardiology Office Note    Date:  05/17/2023   ID:  Larry [name], DOB 1961-10-08, MRN [id number]  PCP:  Larry [name]., MD  Cardiologist:  Armanda Magic, MD  Electrophysiologist:  Lanier Prude, MD   Chief Complaint: ***  History of Present Illness:   Larry [name] is a 61 y.o. male with history of ***  Labwork independently reviewed:    Past History   Past Medical History:  Diagnosis Date   Arthritis    Chronic systolic CHF (congestive heart failure) (HCC) 04/09/2022   Coronary artery disease involving native coronary artery of native heart without angina pectoris    GERD without esophagitis 09/12/2020   Major depressive disorder 09/12/2020   Mixed hyperlipidemia 09/12/2020   Multiple sclerosis (HCC) 09/12/2020   Nerve pain    Thrombocytopenia (HCC) 08/28/2017    Past Surgical History:  Procedure Laterality Date   CORONARY PRESSURE/FFR STUDY N/A 10/05/2020   Procedure: INTRAVASCULAR PRESSURE WIRE/FFR STUDY;  Surgeon: Corky Crafts, MD;  Location: [facility] INVASIVE CV LAB;  Service: Cardiovascular;  Laterality: N/A;   CORONARY STENT INTERVENTION N/A 10/05/2020   Procedure: CORONARY STENT INTERVENTION;  Surgeon: Corky Crafts, MD;  Location: [facility] INVASIVE CV LAB;  Service: Cardiovascular;  Laterality: N/A;  LAD   CORONARY ULTRASOUND/IVUS N/A 10/05/2020   Procedure: Intravascular Ultrasound/IVUS;  Surgeon: Corky Crafts, MD;  Location: [facility] INVASIVE CV LAB;  Service: Cardiovascular;  Laterality: N/A;  LAD   ICD IMPLANT N/A 01/14/2021   Procedure: ICD IMPLANT;  Surgeon: Lanier Prude, MD;  Location: [facility] INVASIVE CV LAB;  Service: Cardiovascular;  Laterality: N/A;   LEFT HEART CATH AND CORONARY ANGIOGRAPHY N/A 10/05/2020   Procedure: LEFT HEART CATH AND CORONARY ANGIOGRAPHY;  Surgeon: Corky Crafts, MD;  Location: [facility] INVASIVE CV LAB;  Service: Cardiovascular;  Laterality: N/A;    Current Medications: No outpatient medications have been marked as  taking for the 05/17/23 encounter (Appointment) with Laurann Montana, PA-C.   ***   Allergies:   Aspirin   Social History   Socioeconomic History   Marital status: Married    Spouse name: Not on file   Number of children: Not on file   Years of education: Not on file   Highest education level: Not on file  Occupational History   Not on file  Tobacco Use   Smoking status: Every Day    Types: Cigarettes   Smokeless tobacco: Never  Substance and Sexual Activity   Alcohol use: No   Drug use: No   Sexual activity: Not on file  Other Topics Concern   Not on file  Social History Narrative   Not on file   Social Determinants of Health   Financial Resource Strain: Not on file  Food Insecurity: Not on file  Transportation Needs: Not on file  Physical Activity: Not on file  Stress: Not on file  Social Connections: Not on file     Family History:  The patient's ***family history includes Heart attack (age of onset: 42) in his mother. There is no history of Lung cancer.  ROS:   Please see the history of present illness. Otherwise, review of systems is positive for ***.  All other systems are reviewed and otherwise negative.    EKG(s)/Additional Testing   EKG:  EKG is ordered today, personally reviewed, demonstrating ***  CV Studies: Cardiac studies reviewed are outlined and summarized above. Otherwise please see EMR for full report.  Recent Labs: No  results found for requested labs within last 365 days.  Recent Lipid Panel    Component Value Date/Time   CHOL 109 02/24/2022 1212   TRIG 92 02/24/2022 1212   HDL 38 (L) 02/24/2022 1212   CHOLHDL 2.9 02/24/2022 1212   LDLCALC 53 02/24/2022 1212    PHYSICAL EXAM:    VS:  There were no vitals taken for this visit.  BMI: There is no height or weight on file to calculate BMI.  GEN: Well nourished, well developed male in no acute distress HEENT: normocephalic, atraumatic Neck: no JVD, carotid bruits, or masses Cardiac:  ***RRR; no murmurs, rubs, or gallops, no edema  Respiratory:  clear to auscultation bilaterally, normal work of breathing GI: soft, nontender, nondistended, + BS MS: no deformity or atrophy Skin: warm and dry, no rash Neuro:  Alert and Oriented x 3, Strength and sensation are intact, follows commands Psych: euthymic mood, full affect  Wt Readings from Last 3 Encounters:  08/24/22 243 lb 9.6 oz (110.5 kg)  04/13/22 247 lb 2.2 oz (112.1 kg)  03/06/22 252 lb (114.3 kg)     ASSESSMENT & PLAN:   ***     Disposition: F/u with ***   Medication Adjustments/Labs and Tests Ordered: Current medicines are reviewed at length with the patient today.  Concerns regarding medicines are outlined above. Medication changes, Labs and Tests ordered today are summarized above and listed in the Patient Instructions accessible in Encounters.   Signed, Laurann Montana, PA-C  05/17/2023 7:37 AM    [facility] Phone: [phone]; Fax: [phone]

## 2023-05-17 NOTE — Patient Instructions (Signed)
Medication Instructions:  Your physician recommends that you continue on your current medications as directed. Please refer to the Current Medication list given to you today.  *If you need a refill on your cardiac medications before your next appointment, please call your pharmacy*  Lab Work: TODAY: Lipid, CMET If you have labs (blood work) drawn today and your tests are completely normal, you will receive your results only by: MyChart Message (if you have MyChart) OR A paper copy in the mail If you have any lab test that is abnormal or we need to change your treatment, we will call you to review the results.  Testing/Procedures: Your physician has requested that you have an echocardiogram. Echocardiography is a painless test that uses sound waves to create images of your heart. It provides your doctor with information about the size and shape of your heart and how well your heart's chambers and valves are working. This procedure takes approximately one hour. There are no restrictions for this procedure. Please do NOT wear cologne, perfume, aftershave, or lotions (deodorant is allowed). Please arrive 15 minutes prior to your appointment time.  Follow-Up: At [facility], you and your health needs are our priority.  As part of our continuing mission to provide you with exceptional heart care, we have created designated Provider Care Teams.  These Care Teams include your primary Cardiologist (physician) and Advanced Practice Providers (APPs -  Physician Assistants and Nurse Practitioners) who all work together to provide you with the care you need, when you need it.  Your next appointment:   6 month(s) with Dr. Mayford Knife or APP Next available with Dr. Lalla Brothers for ICD follow-up  The format for your next appointment:   In Person  Provider:   Armanda Magic, MD  or Jari Favre, PA-C, Ronie Spies, PA-C, Robin Searing, NP, Eligha Bridegroom, NP, or Tereso Newcomer, PA-C

## 2023-05-18 LAB — COMPREHENSIVE METABOLIC PANEL
ALT: 29 IU/L (ref 0–44)
AST: 23 IU/L (ref 0–40)
Albumin/Globulin Ratio: 2.2 (ref 1.2–2.2)
Albumin: 4.9 g/dL (ref 3.9–4.9)
Alkaline Phosphatase: 94 IU/L (ref 44–121)
BUN/Creatinine Ratio: 9 — ABNORMAL LOW (ref 10–24)
BUN: 9 mg/dL (ref 8–27)
Bilirubin Total: 0.8 mg/dL (ref 0.0–1.2)
CO2: 23 mmol/L (ref 20–29)
Calcium: 9.3 mg/dL (ref 8.6–10.2)
Chloride: 104 mmol/L (ref 96–106)
Creatinine, Ser: 1.05 mg/dL (ref 0.76–1.27)
Globulin, Total: 2.2 g/dL (ref 1.5–4.5)
Glucose: 98 mg/dL (ref 70–99)
Potassium: 4.1 mmol/L (ref 3.5–5.2)
Sodium: 142 mmol/L (ref 134–144)
Total Protein: 7.1 g/dL (ref 6.0–8.5)
eGFR: 81 mL/min/{1.73_m2} (ref >59)

## 2023-05-18 LAB — LIPID PANEL
Chol/HDL Ratio: 3.2 ratio (ref 0.0–5.0)
Cholesterol, Total: 116 mg/dL (ref 100–199)
HDL: 36 mg/dL — ABNORMAL LOW (ref >39)
LDL Chol Calc (NIH): 61 mg/dL (ref 0–99)
Triglycerides: 97 mg/dL (ref 0–149)
VLDL Cholesterol Cal: 19 mg/dL (ref 5–40)

## 2023-05-21 ENCOUNTER — Telehealth: Payer: Self-pay | Admitting: *Deleted

## 2023-05-21 NOTE — Telephone Encounter (Signed)
-----   Message from [place], [place] L sent at 05/17/2023  5:29 PM EDT ----- Lind Guest, NP saw [name] today and told her he hasn't followed up with anyone regarding his OSA in "a while." We were wondering if he would need to have an updated sleep study?

## 2023-05-21 NOTE — Telephone Encounter (Signed)
Reached out to patient to see if he had a provider following his sleep therapy and his wife PER DPR states no he does not but rather orders his supplies offline. She states he is retired from [employer] and does not have to be monitored anymore and they declined to get an appointment with dr Mayford Knife.

## 2023-06-04 ENCOUNTER — Other Ambulatory Visit: Payer: Self-pay | Admitting: Cardiology

## 2023-06-07 NOTE — Progress Notes (Signed)
Remote ICD transmission.   

## 2023-06-19 ENCOUNTER — Ambulatory Visit (HOSPITAL_COMMUNITY): Payer: Medicare HMO | Attending: Cardiology

## 2023-06-19 DIAGNOSIS — I5022 Chronic systolic (congestive) heart failure: Secondary | ICD-10-CM | POA: Diagnosis not present

## 2023-06-19 DIAGNOSIS — I77810 Thoracic aortic ectasia: Secondary | ICD-10-CM | POA: Insufficient documentation

## 2023-06-19 LAB — ECHOCARDIOGRAM COMPLETE
Area-P 1/2: 3.26 cm2
S' Lateral: 5.8 cm

## 2023-06-19 MED ORDER — PERFLUTREN LIPID MICROSPHERE
3.0000 mL | INTRAVENOUS | Status: AC | PRN
  Administered 2023-06-19: 3 mL via INTRAVENOUS

## 2023-06-20 NOTE — Telephone Encounter (Signed)
This encounter was created in error - please disregard.

## 2023-07-05 ENCOUNTER — Other Ambulatory Visit (HOSPITAL_COMMUNITY): Payer: Self-pay | Admitting: Internal Medicine

## 2023-08-15 ENCOUNTER — Ambulatory Visit (INDEPENDENT_AMBULATORY_CARE_PROVIDER_SITE_OTHER): Payer: Medicare HMO

## 2023-08-15 DIAGNOSIS — I429 Cardiomyopathy, unspecified: Secondary | ICD-10-CM | POA: Diagnosis not present

## 2023-08-16 LAB — CUP PACEART REMOTE DEVICE CHECK
Battery Remaining Longevity: 162 mo
Battery Remaining Percentage: 100 %
Brady Statistic RV Percent Paced: 0 %
Date Time Interrogation Session: 20240829125000
HighPow Impedance: 109 Ohm
Implantable Lead Connection Status: 753985
Implantable Lead Implant Date: 20220128
Implantable Lead Location: 753860
Implantable Lead Model: 673
Implantable Lead Serial Number: 151771
Implantable Pulse Generator Implant Date: 20220128
Lead Channel Impedance Value: 398 Ohm
Lead Channel Setting Pacing Amplitude: 2.5 V
Lead Channel Setting Pacing Pulse Width: 0.4 ms
Lead Channel Setting Sensing Sensitivity: 0.5 mV
Pulse Gen Serial Number: 289752
Zone Setting Status: 755011

## 2023-08-17 ENCOUNTER — Other Ambulatory Visit: Payer: Self-pay | Admitting: Cardiology

## 2023-08-23 NOTE — Progress Notes (Signed)
Remote ICD transmission.   

## 2023-09-04 ENCOUNTER — Ambulatory Visit: Payer: Medicare HMO | Admitting: Student

## 2023-10-10 NOTE — Progress Notes (Deleted)
Electrophysiology Office Note:   Date:  10/10/2023  ID:  Larry [name], DOB 21-Feb-1961, MRN [id number]  Primary Cardiologist: Armanda Magic, MD Electrophysiologist: Lanier Prude, MD   {Click to update primary MD,subspecialty MD or APP then REFRESH:1}    History of Present Illness:   Larry [name] is a 62 y.o. male with h/o ICM, sCHF s/p ICD, CAD s/p DES, COPD, tobacco abuse, OSA on CPAP, DM II, MS  seen today for routine electrophysiology followup.   Remote device check 08/19/23 showed normal device function, lead parameters stable per Dr. Lalla Brothers.   Last EP Clinic visit 06/2021 with Dr. Lalla Brothers he reported he felt as though he was steadily declining with poor exercise tolerance, SOB with exertion and exhausted.   Since last being seen in our clinic the patient reports doing ***.  he denies chest pain, palpitations, dyspnea, PND, orthopnea, nausea, vomiting, dizziness, syncope, edema, weight gain, or early satiety.   Review of systems complete and found to be negative unless listed in HPI.   EP Information / Studies Reviewed:    EKG is not ordered today. EKG from 05/17/23 reviewed which showed ST with PVC's      ICD Interrogation-  reviewed in detail today,  See PACEART report.  Device History: Magazine features editor ICD implanted 01/14/21 for ICM History of appropriate therapy: No History of AAD therapy: No   Studies:  Coronary CT 09/2020 > CCS 109 / 74th percentile for matched control, multivessel CAD LHC 09/2021 > mid RCA 100%, mid LAD 70%-->DES, 2nd diag lesion 70% lesion.  ECHO 06/2023 >  LVEF 30-35%, LV moderately decreased function with global hypokinesis, LV internal cavity severely dilated, GII DD, RV systolic function normal   Risk Assessment/Calculations:   {Does this patient have ATRIAL FIBRILLATION?:[phone]} No BP recorded.  {Refresh Note OR Click here to enter BP  :1}***        Physical Exam:   VS:  There were no vitals taken for this visit.    Wt Readings from Last 3 Encounters:  05/17/23 230 lb (104.3 kg)  08/24/22 243 lb 9.6 oz (110.5 kg)  04/13/22 247 lb 2.2 oz (112.1 kg)     GEN: Well nourished, well developed in no acute distress NECK: No JVD; No carotid bruits CARDIAC: {EPRHYTHM:28826}, no murmurs, rubs, gallops RESPIRATORY:  Clear to auscultation without rales, wheezing or rhonchi  ABDOMEN: Soft, non-tender, non-distended EXTREMITIES:  No edema; No deformity   ASSESSMENT AND PLAN:    Chronic Systolic Dysfunction due to ICM s/p [employer] single chamber ICD  NYHA II ***.  -euvolemic today -Stable on an appropriate medical regimen -Normal ICD function -See Pace Art report -No changes today -GDMT per Dr. Gala Romney   CAD s/p DES  -DAPT per Cardiology   Hx NSVT / Syncope, Orthostatic Hypotension  -limits GDMT  -on midodrine   HLD -per Cardiology   HTN  -on midodrine  -BP stable ***  Multiple Sclerosis  -per primary   Disposition:   Follow up with {EPPROVIDERS:28135} {EPFOLLOW AO:13086}   Signed, Canary Brim, MSN, APRN, NP-C, AGACNP-BC [facility] - Electrophysiology  10/10/2023, 8:12 AM

## 2023-10-11 ENCOUNTER — Ambulatory Visit: Payer: Medicare HMO | Admitting: Physician Assistant

## 2023-10-22 ENCOUNTER — Ambulatory Visit: Payer: Medicare HMO | Admitting: Cardiology

## 2023-11-02 ENCOUNTER — Ambulatory Visit: Payer: Medicare HMO | Admitting: Physician Assistant

## 2023-11-14 ENCOUNTER — Ambulatory Visit (INDEPENDENT_AMBULATORY_CARE_PROVIDER_SITE_OTHER): Payer: Medicare HMO

## 2023-11-14 DIAGNOSIS — I429 Cardiomyopathy, unspecified: Secondary | ICD-10-CM

## 2023-11-14 LAB — CUP PACEART REMOTE DEVICE CHECK
Battery Remaining Longevity: 168 mo
Battery Remaining Percentage: 100 %
Brady Statistic RV Percent Paced: 0 %
Date Time Interrogation Session: 20241127015100
HighPow Impedance: 106 Ohm
Implantable Lead Connection Status: 753985
Implantable Lead Implant Date: 20220128
Implantable Lead Location: 753860
Implantable Lead Model: 673
Implantable Lead Serial Number: 151771
Implantable Pulse Generator Implant Date: 20220128
Lead Channel Impedance Value: 373 Ohm
Lead Channel Setting Pacing Amplitude: 2.5 V
Lead Channel Setting Pacing Pulse Width: 0.4 ms
Lead Channel Setting Sensing Sensitivity: 0.5 mV
Pulse Gen Serial Number: 289752
Zone Setting Status: 755011

## 2023-11-19 ENCOUNTER — Other Ambulatory Visit: Payer: Self-pay

## 2023-11-19 MED ORDER — MIDODRINE HCL 5 MG PO TABS: 5.0000 mg | ORAL_TABLET | Freq: Three times a day (TID) | ORAL | 1 refills | Status: DC

## 2023-11-20 NOTE — Progress Notes (Unsigned)
Cardiology Office Note:  .   Date:  11/20/2023  ID:  Larry [name], DOB July 10, 1961, MRN [id number] PCP: Roberts Gaudy., MD  [facility] Providers Cardiologist:  Armanda Magic, MD Electrophysiologist:  Lanier Prude, MD {  History of Present Illness: .   Larry [name] is a 62 y.o. male w/PMHx of MS, ICM, ICD, COPD, HTN, DM, CAD (10/05/20 showing CTO of RCA with R-R and L-R collaterals, 70% mLAD s/p DES, 70% D2 too small for stent), DM, HTN > orthostatic hypotension,   Hx of syncope > c.MRI showed reduced EF <35% and large scar burden >> ICD planned  He sa Dr. Lalla Brothers 07/01/21, reported then a steady decline in overall wellbeing, exertional capacity, worse DOE. Referred to AHF clinic  He last saw th HF clinic Jan 2023, doing poorly, on midodrine with ongoing significant orthostatic symptoms, still smoking, had seen neurology, MRI neg for for any new MS lesions. Drinking a lot of water/mt dew. Not an advanced therapies candidate, losartan stopped, unable to get GDMT going 2/2 his orthatstic issues/symptoms, described as profound dizziness. Advised compression stockings, not felt to have more to offer from their perspective with plans to c/w dr. Norberto Sorenson  Seeing gen cards a few times since then, most recently 05/17/23, with K. Chad, NP, doing "well), getting over a GI illness, no rest SOB, known baseline DPE, , CPAP compliant, reported an episode of dizziness, maintained on midodrine, DAPT.  Today's visit is scheduled as an annual visit  ROS:   He is accompanied by his wife. He is at his baseline Orthostatic dizziness waxes/wanes more of late, no syncope No CP, palpitations Denies SOB  Device information BSCi single chamber ICD implanted 01/14/2021  Arrhythmia/AAD hx No AAD done to date that I find  Studies Reviewed: Marland Kitchen    EKG not done today  DEVICE interrogation done today and reviewed by myself Battery and lead measurements are good No VT VP <1% Heart logic  score is zero  echo 03/2022 showed EF 30-35%, mild LV dilation, normal RV, mild dilation of aortic root 38mm    CMRI  11/2020 -LVEF is severely reduced at 30% and there is significant burden of transmural myocardial scarring (poor chance of recovery) in 7 segments (~ 40% of left ventricular myocardium).    Echo 07/2021 - EF 30-35% , HK LV, RV normal Echo 11/2020- EF 30-35%, RV normal      LHC 11/2020  Mid RCA lesion is 100% stenosed. Chronic total occlusion. Right to right and left to right collaterals. Mid LAD lesion is 70% stenosed. DFR 0.85. A drug-eluting stent was successfully placed using a SYNERGY XD 3.0X28. Post intervention, there is a 0% residual stenosis. 2nd Diag lesion is 70% stenosed. This is a small branch vessel, too small for stent. There is moderate left ventricular systolic dysfunction. The left ventricular ejection fraction is 35-45% by visual estimate. LV end diastolic pressure is normal. There is no aortic valve stenosis.   Coronary CT -09/2021 CAD-RADS 4 Severe stenosis. (70-99%). Cardiac catheterization is recommended.     Risk Assessment/Calculations:    Physical Exam:   VS:  There were no vitals taken for this visit.   Wt Readings from Last 3 Encounters:  05/17/23 230 lb (104.3 kg)  08/24/22 243 lb 9.6 oz (110.5 kg)  04/13/22 247 lb 2.2 oz (112.1 kg)    GEN: Well nourished, well developed in no acute distress, chronically ill appearing NECK: No JVD; No carotid bruits CARDIAC: RRR, no  murmurs, rubs, gallops RESPIRATORY:   CTA b/l without rales, wheezing or rhonchi  ABDOMEN: Soft, non-tender, non-distended EXTREMITIES:  No edema; No deformity   ICD site: is stable, no thinning, fluctuation, tethering, he has a skin lesion, on the superior aspect of his pocket (known to have skin cancer in the past and worrisome in appearance and growth rate described)  ASSESSMENT AND PLAN: .    ICD Intact function No programming changes made  He has a skin  lesion on his pocket, worrisome with his hx of skin cancer and description of growth pattern/rate, he has dermatology appt,  though his wife is trying to get it moved up. Excision will need to be in coordination with [place], discussed with them Presumably will need/use some cautery and his device will need to be managed Also, discussed that his dermatologist will need to discuss with us/Dr. Lalla Brothers given it is directly ober the device, and importance of avoiding entering the pocket with excision, strategy for management/removal He and his wife state understanding and rational for concern of infection risks They will keep [place] in the loop   CAD No anginal symptoms C/w Dr. Norberto Sorenson  ICM Chronic CHF Orthostatic dizziness limits GDMT Not currently volume OL C/w Dr. Norberto Sorenson   Dispo: remotes as usual, othewise EP back in a year, sooner if needed  Signed, Helon Wisinski Norberto Sorenson, PA-C

## 2023-11-22 ENCOUNTER — Ambulatory Visit: Payer: Medicare HMO | Attending: Physician Assistant | Admitting: Physician Assistant

## 2023-11-22 ENCOUNTER — Encounter: Payer: Self-pay | Admitting: Physician Assistant

## 2023-11-22 VITALS — BP 112/62 | HR 68 | Ht 70.0 in | Wt 235.0 lb

## 2023-11-22 DIAGNOSIS — R42 Dizziness and giddiness: Secondary | ICD-10-CM

## 2023-11-22 DIAGNOSIS — Z95810 Presence of automatic (implantable) cardiac defibrillator: Secondary | ICD-10-CM | POA: Diagnosis not present

## 2023-11-22 DIAGNOSIS — I2510 Atherosclerotic heart disease of native coronary artery without angina pectoris: Secondary | ICD-10-CM | POA: Diagnosis not present

## 2023-11-22 DIAGNOSIS — I255 Ischemic cardiomyopathy: Secondary | ICD-10-CM

## 2023-11-22 DIAGNOSIS — I5022 Chronic systolic (congestive) heart failure: Secondary | ICD-10-CM | POA: Diagnosis not present

## 2023-11-22 LAB — CUP PACEART INCLINIC DEVICE CHECK
Date Time Interrogation Session: 20241205170501
HighPow Impedance: 101 Ohm
Implantable Lead Connection Status: 753985
Implantable Lead Implant Date: 20220128
Implantable Lead Location: 753860
Implantable Lead Model: 673
Implantable Lead Serial Number: 151771
Implantable Pulse Generator Implant Date: 20220128
Lead Channel Impedance Value: 369 Ohm
Lead Channel Pacing Threshold Amplitude: 1 V
Lead Channel Pacing Threshold Pulse Width: 0.4 ms
Lead Channel Sensing Intrinsic Amplitude: 9.8 mV
Lead Channel Setting Pacing Amplitude: 2.5 V
Lead Channel Setting Pacing Pulse Width: 0.4 ms
Lead Channel Setting Sensing Sensitivity: 0.5 mV
Pulse Gen Serial Number: 289752
Zone Setting Status: 755011

## 2023-11-22 MED ORDER — NITROGLYCERIN 0.4 MG SL SUBL: 0.4000 mg | SUBLINGUAL_TABLET | SUBLINGUAL | 3 refills | Status: AC | PRN

## 2023-11-22 NOTE — Patient Instructions (Signed)
Medication Instructions:   Your physician recommends that you continue on your current medications as directed. Please refer to the Current Medication list given to you today.   *If you need a refill on your cardiac medications before your next appointment, please call your pharmacy*   Lab Work: NONE ORDERED  TODAY    If you have labs (blood work) drawn today and your tests are completely normal, you will receive your results only by: MyChart Message (if you have MyChart) OR A paper copy in the mail If you have any lab test that is abnormal or we need to change your treatment, we will call you to review the results.   Testing/Procedures: NONE ORDERED  TODAY    Follow-Up:  At [facility], you and your health needs are our priority.  As part of our continuing mission to provide you with exceptional heart care, we have created designated Provider Care Teams.  These Care Teams include your primary Cardiologist (physician) and Advanced Practice Providers (APPs -  Physician Assistants and Nurse Practitioners) who all work together to provide you with the care you need, when you need it.  We recommend signing up for the patient portal called "MyChart".  Sign up information is provided on this After Visit Summary.  MyChart is used to connect with patients for Virtual Visits (Telemedicine).  Patients are able to view lab/test results, encounter notes, upcoming appointments, etc.  Non-urgent messages can be sent to your provider as well.   To learn more about what you can do with MyChart, go to [email or web address].    Your next appointment:    1 year(s)  Provider:    You may see Lanier Prude, MD or one of the following Advanced Practice Providers on your designated Care Team:   Francis Dowse, [place]  Other Instructions

## 2023-12-20 DIAGNOSIS — I429 Cardiomyopathy, unspecified: Secondary | ICD-10-CM | POA: Diagnosis not present

## 2024-01-03 ENCOUNTER — Ambulatory Visit: Payer: Medicare HMO | Admitting: Cardiology

## 2024-02-13 ENCOUNTER — Ambulatory Visit (INDEPENDENT_AMBULATORY_CARE_PROVIDER_SITE_OTHER): Payer: Medicare HMO

## 2024-02-13 DIAGNOSIS — I255 Ischemic cardiomyopathy: Secondary | ICD-10-CM | POA: Diagnosis not present

## 2024-02-13 LAB — COLOGUARD: COLOGUARD: POSITIVE — AB

## 2024-02-13 LAB — EXTERNAL GENERIC LAB PROCEDURE: COLOGUARD: POSITIVE — AB

## 2024-02-14 LAB — CUP PACEART REMOTE DEVICE CHECK
Battery Remaining Longevity: 162 mo
Battery Remaining Percentage: 100 %
Brady Statistic RV Percent Paced: 0 %
Date Time Interrogation Session: 20250226020400
HighPow Impedance: 95 Ohm
Implantable Lead Connection Status: 753985
Implantable Lead Implant Date: 20220128
Implantable Lead Location: 753860
Implantable Lead Model: 673
Implantable Lead Serial Number: 151771
Implantable Pulse Generator Implant Date: 20220128
Lead Channel Impedance Value: 353 Ohm
Lead Channel Setting Pacing Amplitude: 2.5 V
Lead Channel Setting Pacing Pulse Width: 0.4 ms
Lead Channel Setting Sensing Sensitivity: 0.5 mV
Pulse Gen Serial Number: 289752
Zone Setting Status: 755011

## 2024-02-19 ENCOUNTER — Encounter: Payer: Self-pay | Admitting: Cardiology

## 2024-03-10 NOTE — Progress Notes (Unsigned)
 Cardiology Office Note   Date:  03/11/2024   ID:  Larry [name], DOB January 25, 1961, MRN [id number]  PCP:  Roberts Gaudy., MD  Cardiologist: Dr. Armanda Magic, MD  Chief Complaint  Patient presents with   Coronary Artery Disease   Hyperlipidemia   Hypertension   Congestive Heart Failure   Cardiomyopathy    History of Present Illness: Larry [name] is a 63 y.o. male  with hx of MS, OSA on CPAP, peripheral neuropathy, HTN with orthostatic hypotension, HLD, DM2,  tobacco use, and recent RSV  who recently underwent outpatient cardiac catheterization secondary to positive cardiac CTA showing total RCA disease, LAD with 50 to 69% stenosis, CT FFR across the lesion showing 0.61 plaque with high risk features.  LCx noted with a 50 to 69% stenosis and CT FFR at 0.76.  Given this, OP LHC was arranged.    He underwent cardiac catheterization 10/05/2020 which showed a chronic total occlusion or mRCA with right to right and left to right collaterals. Mid LAD lesion is 70% stenosed (DFR 0.85) s/p DES placement.  There was a 70% 2nd diag, vessel small for stent.  Plan for DAPT with ASA/Plavix for at least 6 months and consider Plavix as monotherapy after 6 months. The patient was seen by cardiac rehab while in short stay. There were no observed complications post cath. Radial cath site was re-evaluated prior to discharge and found to be stable without any complications. Instructions/precautions regarding cath site care were given prior to discharge.  He has a hx of syncope which prompted admission to hospital.  This occurred while standing emptying some buckets.  He then had another episode of syncope that occurred while sitting in his chair and suddenly had a syncopal episode with no prodrome.  It was felt that his dizziness and syncope were related to orthostatic hypotension and he has been on compression hose and midodrine.  He is followed by Dr. Gala Romney advanced heart failure clinic.  His losartan  had to be stopped due to soft blood pressures and orthostatic hypotension with dizziness.  He has not been on a beta-blocker as well.  He was discharged from advanced heart failure clinic as there was no other treatment options.  He does have an ICD in for dilated cardiomyopathy as well as ventricular tachycardia.  He is followed in EP clinic.  He is on midodrine 5 mg 3 times daily and compression hose for his orthostatic hypotension and dizziness.  He does not tolerate ACE/ARB/ARNi or diuretics due to his orthostatic hypotension.   He is here today for followup and is doing well.  He still has chronic DOE but it is very stable. He denies any chest pain or pressure, PND, orthopnea, LE edema, palpitations or syncope. He occasionally has dizziness when going from sitting to standing. He is tolerating meds with no SE.    Past Medical History:  Diagnosis Date   Arthritis    Chronic systolic CHF (congestive heart failure) (HCC) 04/09/2022   Coronary artery disease involving native coronary artery of native heart without angina pectoris    GERD without esophagitis 09/12/2020   Major depressive disorder 09/12/2020   Mixed hyperlipidemia 09/12/2020   Multiple sclerosis (HCC) 09/12/2020   Nerve pain    Thrombocytopenia (HCC) 08/28/2017    Past Surgical History:  Procedure Laterality Date   CORONARY PRESSURE/FFR STUDY N/A 10/05/2020   Procedure: INTRAVASCULAR PRESSURE WIRE/FFR STUDY;  Surgeon: Corky Crafts, MD;  Location: [facility] INVASIVE CV  LAB;  Service: Cardiovascular;  Laterality: N/A;   CORONARY STENT INTERVENTION N/A 10/05/2020   Procedure: CORONARY STENT INTERVENTION;  Surgeon: Corky Crafts, MD;  Location: [facility] INVASIVE CV LAB;  Service: Cardiovascular;  Laterality: N/A;  LAD   CORONARY ULTRASOUND/IVUS N/A 10/05/2020   Procedure: Intravascular Ultrasound/IVUS;  Surgeon: Corky Crafts, MD;  Location: [facility] INVASIVE CV LAB;  Service: Cardiovascular;  Laterality: N/A;  LAD   ICD IMPLANT N/A  01/14/2021   Procedure: ICD IMPLANT;  Surgeon: Lanier Prude, MD;  Location: [facility] INVASIVE CV LAB;  Service: Cardiovascular;  Laterality: N/A;   LEFT HEART CATH AND CORONARY ANGIOGRAPHY N/A 10/05/2020   Procedure: LEFT HEART CATH AND CORONARY ANGIOGRAPHY;  Surgeon: Corky Crafts, MD;  Location: [facility] INVASIVE CV LAB;  Service: Cardiovascular;  Laterality: N/A;     Current Outpatient Medications  Medication Sig Dispense Refill   acetaminophen (TYLENOL) 500 MG tablet Take 500 mg by mouth every 6 (six) hours as needed for mild pain, moderate pain or headache.     albuterol (VENTOLIN HFA) 108 (90 Base) MCG/ACT inhaler Inhale 2 puffs into the lungs every 6 (six) hours as needed for wheezing or shortness of breath. 8 g 2   aspirin EC 81 MG tablet Take 81 mg by mouth daily. Swallow whole.     atorvastatin (LIPITOR) 80 MG tablet Take 1 tablet (80 mg total) by mouth daily. 90 tablet 3   Cholecalciferol 125 MCG (5000 UT) TABS Take by mouth daily.     clopidogrel (PLAVIX) 75 MG tablet Take 1 tablet (75 mg total) by mouth daily. 90 tablet 3   cyanocobalamin (VITAMIN B12) 1000 MCG tablet Take by mouth daily.     Diroximel Fumarate (VUMERITY) 231 MG CPDR Take 462 mg by mouth in the morning and at bedtime.      DULoxetine (CYMBALTA) 60 MG capsule Take 60 mg by mouth daily.  5   ezetimibe (ZETIA) 10 MG tablet Take 1 tablet (10 mg total) by mouth daily. 90 tablet 3   JARDIANCE 10 MG TABS tablet TAKE 1 TABLET(10 MG) BY MOUTH DAILY BEFORE BREAKFAST 30 tablet 11   methocarbamol (ROBAXIN) 750 MG tablet Take 750 mg by mouth 3 (three) times daily. Scheduled     midodrine (PROAMATINE) 5 MG tablet Take 1 tablet (5 mg total) by mouth 3 (three) times daily with meals. 270 tablet 1   Multiple Vitamin (MULTIVITAMIN WITH MINERALS) TABS tablet Take 1 tablet by mouth daily.     pantoprazole (PROTONIX) 40 MG tablet Take 1 tablet (40 mg total) by mouth daily. 90 tablet 3   pregabalin (LYRICA) 200 MG capsule Take 200 mg  by mouth 3 (three) times daily.     Pseudoephedrine-APAP-DM (DAYQUIL MULTI-SYMPTOM COLD/FLU PO) Take by mouth 2 (two) times daily as needed (cough, upper respiratory symptoms).     WIXELA INHUB 100-50 MCG/ACT AEPB Inhale into the lungs as needed (copd).     zolpidem (AMBIEN) 10 MG tablet Take 10 mg by mouth at bedtime.     nitroGLYCERIN (NITROSTAT) 0.4 MG SL tablet Place 1 tablet (0.4 mg total) under the tongue every 5 (five) minutes as needed for chest pain. 25 tablet 3   No current facility-administered medications for this visit.    Allergies:   Aspirin    Social History:  The patient  reports that he has been smoking cigarettes. He has never used smokeless tobacco. He reports that he does not drink alcohol and does not use drugs.  Family History:  The patient's family history includes Heart attack (age of onset: 8) in his mother.    ROS:  Please see the history of present illness. Otherwise, review of systems are positive for none.   All other systems are reviewed and negative.    PHYSICAL EXAM: VS:  BP 130/86   Pulse 66   Ht 5\' 10"  (1.778 m)   Wt 230 lb (104.3 kg)   SpO2 92%   BMI 33.00 kg/m  , BMI Body mass index is 33 kg/m.   GEN: Well nourished, well developed in no acute distress HEENT: Normal NECK: No JVD; No carotid bruits LYMPHATICS: No lymphadenopathy CARDIAC:RRR, no murmurs, rubs, gallops RESPIRATORY:  Clear to auscultation without rales, wheezing or rhonchi  ABDOMEN: Soft, non-tender, non-distended MUSCULOSKELETAL:  No edema; No deformity  SKIN: Warm and dry NEUROLOGIC:  Alert and oriented x 3 PSYCHIATRIC:  Normal affect  Recent Labs: 05/17/2023: ALT 29; BUN 9; Creatinine, Ser 1.05; Potassium 4.1; Sodium 142    Lipid Panel    Component Value Date/Time   CHOL 116 05/17/2023 1623   TRIG 97 05/17/2023 1623   HDL 36 (L) 05/17/2023 1623   CHOLHDL 3.2 05/17/2023 1623   LDLCALC 61 05/17/2023 1623      Wt Readings from Last 3 Encounters:  03/11/24 230  lb (104.3 kg)  11/22/23 235 lb (106.6 kg)  05/17/23 230 lb (104.3 kg)    Other studies Reviewed: Additional studies/ records that were reviewed today include:  Review of the above records demonstrates:    Cardiac Catheterization 10/05/2020:   CORONARY STENT INTERVENTION  INTRAVASCULAR PRESSURE WIRE/FFR STUDY  Intravascular Ultrasound/IVUS  LEFT HEART CATH AND CORONARY ANGIOGRAPHY  Conclusion     Mid RCA lesion is 100% stenosed. Chronic total occlusion. Right to right and left to right collaterals. Mid LAD lesion is 70% stenosed. DFR 0.85. A drug-eluting stent was successfully placed using a SYNERGY XD 3.0X28. Post intervention, there is a 0% residual stenosis. 2nd Diag lesion is 70% stenosed. This is a small branch vessel, too small for stent. There is moderate left ventricular systolic dysfunction. The left ventricular ejection fraction is 35-45% by visual estimate. LV end diastolic pressure is normal. There is no aortic valve stenosis.   Continue aggressive secondary prevention.  Consider clopidogrel monotherapy after 6 months given other CAD.   Medical therapy for LV dysfunction.  EF should improve with this revascularization since flow to anterior wall and collaterals to RCA has improved.    Diagnostic Dominance: Right  Intervention     ASSESSMENT AND PLAN:  1. CAD s/p PCI to mLAD: -LHC performed 10/05/2020 which showed a chronic total occlusion or mRCA with right to right and left to right collaterals. Mid LAD lesion is 70% stenosed (DFR 0.85) s/p DES placement.  There was a 70% 2nd diag, vessel small for stent.  -Lexiscan Myoview done for chest pain on 03/06/2022 showed fixed apical to basal inferior and inferior septal perfusion defect with hypokinesis consistent with prior infarct and no ischemia.  LV function was reduced to 26% -He denies any anginal symptoms -Continue prescription drug adjuvant with atorvastatin 80 mg daily, Zetia 10 mg daily, aspirin 81 mg  daily, Plavix 75 mg daily -No beta-blocker due to history of orthostatic hypotension  2. HLD: -LDL goal < 70 -check FLP and ALT today -Continue prescription drug management atorvastatin 80 mg daily and Zetia 10 mg daily with as needed refills  3. HTN: -BP is controlled on exam today -He has  not been on any antihypertensive therapy due to orthostatic hypotension and requires midodrine  4.  Syncope/NSVT/Orthostatic Hypotension/dizziness -he has had several episdoes of syncope prior to his heart cath. The first episode occurred while standing up emptying some buckets.  The other episode occurred when he was sitting in a chair and just fell out with no prodrome. -event monitor showed episodes of NSVT but no bradyarrhythmias -He has had an extensive evaluation by neurology with no revealing etiology. -Losartan was recently stopped to allow higher blood pressure and compression hose were added and he was continued on midodrine 5 mg 3 times daily -He denies any further episodes recently of dizziness or syncope. -Continue prescription management with midodrine 5 mg 3 times daily with as needed refills  5.  Chronic Systolic Heart Failure -CMRI 11/2020 -LVEF is severely reduced at 30% and there is significant burden of transmural myocardial scarring (poor chance of recovery) in 7 segments (~ 40% of left ventricular myocardium).  - 12/2020 [employer] ICD placed. - Echo 07/2021 - EF 30-35% , HK LV, RV normal - NYHA III predominantly related to dizziness - No ACE/ARB/beta-blocker due to orthostatic hypotension and dizziness - Continue drug management with Jardiance 10 mg daily with as needed refills - Unable to initiate additional GDMT with need for midodrine and profound dizziness in the past.  - He is not candidate for advanced therapies. - He follows in device clinic  6.  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

## 2024-03-11 ENCOUNTER — Ambulatory Visit: Payer: Medicare HMO | Attending: Cardiology | Admitting: Cardiology

## 2024-03-11 ENCOUNTER — Encounter: Payer: Self-pay | Admitting: Cardiology

## 2024-03-11 VITALS — BP 130/86 | HR 66 | Ht 70.0 in | Wt 230.0 lb

## 2024-03-11 DIAGNOSIS — I10 Essential (primary) hypertension: Secondary | ICD-10-CM | POA: Diagnosis not present

## 2024-03-11 DIAGNOSIS — E7800 Pure hypercholesterolemia, unspecified: Secondary | ICD-10-CM

## 2024-03-11 DIAGNOSIS — I5022 Chronic systolic (congestive) heart failure: Secondary | ICD-10-CM

## 2024-03-11 DIAGNOSIS — I255 Ischemic cardiomyopathy: Secondary | ICD-10-CM

## 2024-03-11 DIAGNOSIS — I77810 Thoracic aortic ectasia: Secondary | ICD-10-CM

## 2024-03-11 DIAGNOSIS — I951 Orthostatic hypotension: Secondary | ICD-10-CM

## 2024-03-11 DIAGNOSIS — I2510 Atherosclerotic heart disease of native coronary artery without angina pectoris: Secondary | ICD-10-CM

## 2024-03-11 NOTE — Patient Instructions (Signed)
 Medication Instructions:  Your physician recommends that you continue on your current medications as directed. Please refer to the Current Medication list given to you today.  *If you need a refill on your cardiac medications before your next appointment, please call your pharmacy*  Lab Work: TODAY: fasting Lipid panel, ALT If you have labs (blood work) drawn today and your tests are completely normal, you will receive your results only by: MyChart Message (if you have MyChart) OR A paper copy in the mail If you have any lab test that is abnormal or we need to change your treatment, we will call you to review the results.  Testing/Procedures: Your physician has requested that you have an echocardiogram. Echocardiography is a painless test that uses sound waves to create images of your heart. It provides your doctor with information about the size and shape of your heart and how well your heart's chambers and valves are working. This procedure takes approximately one hour. There are no restrictions for this procedure. Please do NOT wear cologne, perfume, aftershave, or lotions (deodorant is allowed). Please arrive 15 minutes prior to your appointment time.  Please note: We ask at that you not bring children with you during ultrasound (echo/ vascular) testing. Due to room size and safety concerns, children are not allowed in the ultrasound rooms during exams. Our front office staff cannot provide observation of children in our lobby area while testing is being conducted. An adult accompanying a patient to their appointment will only be allowed in the ultrasound room at the discretion of the ultrasound technician under special circumstances. We apologize for any inconvenience.  Follow-Up: At [facility], you and your health needs are our priority.  As part of our continuing mission to provide you with exceptional heart care, we have created designated Provider Care Teams.  These Care Teams  include your primary Cardiologist (physician) and Advanced Practice Providers (APPs -  Physician Assistants and Nurse Practitioners) who all work together to provide you with the care you need, when you need it.  Your next appointment:   6 month(s)  The format for your next appointment:   In Person  Provider:   Jari Favre, PA-C, Ronie Spies, PA-C, Robin Searing, NP, or Tereso Newcomer, PA-C     Then, Armanda Magic, MD will plan to see you again in 1 year(s).{  Other Instructions   1st Floor: - Lobby - Registration  - Pharmacy  - Lab - Cafe  2nd Floor: - PV Lab - Diagnostic Testing (echo, CT, nuclear med)  3rd Floor: - Vacant  4th Floor: - TCTS (cardiothoracic surgery) - AFib Clinic - Structural Heart Clinic - Vascular Surgery  - Vascular Ultrasound  5th Floor: - HeartCare Cardiology (general and EP) - Clinical Pharmacy for coumadin, hypertension, lipid, weight-loss medications, and med management appointments    Valet parking services will be available as well.

## 2024-03-11 NOTE — Addendum Note (Signed)
 Addended by: Franchot Gallo on: 03/11/2024 11:45 AM   Modules accepted: Orders

## 2024-03-19 NOTE — Progress Notes (Signed)
 Remote ICD transmission.

## 2024-03-19 NOTE — Addendum Note (Signed)
 Addended by: Elease Etienne A on: 03/19/2024 08:47 AM   Modules accepted: Orders

## 2024-03-27 ENCOUNTER — Telehealth: Payer: Self-pay | Admitting: *Deleted

## 2024-03-27 NOTE — Telephone Encounter (Signed)
   Pre-operative Risk Assessment    Patient Name: Larry [name]  DOB: 11-Jun-1961 MRN: [id number]   Date of last office visit: 03/11/24 DR. TURNER Date of next office visit: NONE   Request for Surgical Clearance    Procedure:   EGD/COLONOSCOPY  Date of Surgery:  Clearance 04/09/24                                Surgeon:  DR. Concepcion Elk Surgeon's Group or Practice Name:  [facility]  Phone number:  [phone] Fax number:  [phone]   Type of Clearance Requested:   - Medical  - Pharmacy:  Hold Clopidogrel (Plavix) x 4 DAYS PRIOR   Type of Anesthesia:   PROPOFOL?    Additional requests/questions:    Elpidio Anis   03/27/2024, 9:40 AM

## 2024-03-27 NOTE — Telephone Encounter (Signed)
   Patient Name: Larry [name]  DOB: 1961/06/27 MRN: [id number]  Primary Cardiologist: Armanda Magic, MD  Chart reviewed as part of pre-operative protocol coverage. Given past medical history and time since last visit, based on ACC/AHA guidelines, Larry [name] is at acceptable risk for the planned procedure without further cardiovascular testing.  Per Dr. Mayford Knife please be mindful sedation with history of orthostatic hypotension  during procedure.  Patient is fine to hold Plavix 5 days prior to procedure and should restart postprocedure when surgically safe and hemostasis achieved.  The patient was advised that if he develops new symptoms prior to surgery to contact our office to arrange for a follow-up visit, and he verbalized understanding.  I will route this recommendation to the requesting party via Epic fax function and remove from pre-op pool.  Please call with questions.  Napoleon Form, Leodis Rains, NP 03/27/2024, 10:26 AM

## 2024-03-27 NOTE — Telephone Encounter (Signed)
 Good Morning Dr. Mayford Knife  We have received a surgical clearance request for Mr. Larry [name] for upcoming EGD/colonoscopy. They were seen recently in clinic on 03/11/2024.  He has a PMH of LHC CTO of mRCA with right to left collaterals and mid LAD lesion treated with DES x 1 with 10% second diagonal small for stent.  Can you please comment on surgical clearance and guidance on holding Plavix prior to scheduled procedure endoscopy procedure please forward you guidance and recommendations to P CV DIV PREOP.  Thanks,  Robin Searing, MD

## 2024-04-15 ENCOUNTER — Ambulatory Visit (HOSPITAL_COMMUNITY): Attending: Internal Medicine

## 2024-04-15 DIAGNOSIS — I255 Ischemic cardiomyopathy: Secondary | ICD-10-CM | POA: Diagnosis not present

## 2024-04-15 DIAGNOSIS — I2510 Atherosclerotic heart disease of native coronary artery without angina pectoris: Secondary | ICD-10-CM | POA: Insufficient documentation

## 2024-04-15 DIAGNOSIS — E119 Type 2 diabetes mellitus without complications: Secondary | ICD-10-CM | POA: Diagnosis not present

## 2024-04-15 DIAGNOSIS — I509 Heart failure, unspecified: Secondary | ICD-10-CM | POA: Insufficient documentation

## 2024-04-15 DIAGNOSIS — E669 Obesity, unspecified: Secondary | ICD-10-CM | POA: Insufficient documentation

## 2024-04-15 DIAGNOSIS — I77810 Thoracic aortic ectasia: Secondary | ICD-10-CM | POA: Diagnosis not present

## 2024-04-17 LAB — ECHOCARDIOGRAM COMPLETE
Area-P 1/2: 2.93 cm2
S' Lateral: 5.7 cm

## 2024-05-14 ENCOUNTER — Ambulatory Visit: Payer: Medicare HMO

## 2024-05-14 ENCOUNTER — Other Ambulatory Visit: Payer: Self-pay

## 2024-05-14 DIAGNOSIS — I255 Ischemic cardiomyopathy: Secondary | ICD-10-CM

## 2024-05-14 MED ORDER — MIDODRINE HCL 5 MG PO TABS: 5.0000 mg | ORAL_TABLET | Freq: Three times a day (TID) | ORAL | 3 refills | Status: AC

## 2024-05-15 LAB — CUP PACEART REMOTE DEVICE CHECK
Battery Remaining Longevity: 162 mo
Battery Remaining Percentage: 100 %
Brady Statistic RV Percent Paced: 0 %
Date Time Interrogation Session: 20250528015100
HighPow Impedance: 89 Ohm
Implantable Lead Connection Status: 753985
Implantable Lead Implant Date: 20220128
Implantable Lead Location: 753860
Implantable Lead Model: 673
Implantable Lead Serial Number: 151771
Implantable Pulse Generator Implant Date: 20220128
Lead Channel Impedance Value: 367 Ohm
Lead Channel Setting Pacing Amplitude: 2.5 V
Lead Channel Setting Pacing Pulse Width: 0.4 ms
Lead Channel Setting Sensing Sensitivity: 0.5 mV
Pulse Gen Serial Number: 289752
Zone Setting Status: 755011

## 2024-05-19 ENCOUNTER — Ambulatory Visit: Payer: Self-pay | Admitting: Cardiology

## 2024-07-04 NOTE — Progress Notes (Signed)
 Remote ICD transmission.

## 2024-07-04 NOTE — Addendum Note (Signed)
 Addended by: VICCI SELLER A on: 07/04/2024 04:02 PM   Modules accepted: Orders

## 2024-07-25 ENCOUNTER — Other Ambulatory Visit (HOSPITAL_COMMUNITY): Payer: Self-pay | Admitting: Internal Medicine

## 2024-08-13 ENCOUNTER — Ambulatory Visit (INDEPENDENT_AMBULATORY_CARE_PROVIDER_SITE_OTHER)

## 2024-08-13 DIAGNOSIS — I255 Ischemic cardiomyopathy: Secondary | ICD-10-CM | POA: Diagnosis not present

## 2024-08-14 LAB — CUP PACEART REMOTE DEVICE CHECK
Battery Remaining Longevity: 156 mo
Battery Remaining Percentage: 100 %
Brady Statistic RV Percent Paced: 0 %
Date Time Interrogation Session: 20250827015100
HighPow Impedance: 95 Ohm
Implantable Lead Connection Status: 753985
Implantable Lead Implant Date: 20220128
Implantable Lead Location: 753860
Implantable Lead Model: 673
Implantable Lead Serial Number: 151771
Implantable Pulse Generator Implant Date: 20220128
Lead Channel Impedance Value: 364 Ohm
Lead Channel Setting Pacing Amplitude: 2.5 V
Lead Channel Setting Pacing Pulse Width: 0.4 ms
Lead Channel Setting Sensing Sensitivity: 0.5 mV
Pulse Gen Serial Number: 289752
Zone Setting Status: 755011

## 2024-08-15 ENCOUNTER — Ambulatory Visit: Payer: Self-pay | Admitting: Cardiology

## 2024-08-27 ENCOUNTER — Other Ambulatory Visit (HOSPITAL_COMMUNITY): Payer: Self-pay | Admitting: Cardiology

## 2024-09-01 NOTE — Progress Notes (Signed)
Remote ICD Transmission.

## 2024-10-07 ENCOUNTER — Other Ambulatory Visit (HOSPITAL_COMMUNITY): Payer: Self-pay | Admitting: Cardiology

## 2024-10-20 ENCOUNTER — Ambulatory Visit: Admitting: Cardiology

## 2024-11-12 ENCOUNTER — Ambulatory Visit

## 2024-11-12 DIAGNOSIS — I255 Ischemic cardiomyopathy: Secondary | ICD-10-CM | POA: Diagnosis not present

## 2024-11-19 LAB — CUP PACEART REMOTE DEVICE CHECK
Battery Remaining Longevity: 156 mo
Battery Remaining Percentage: 100 %
Brady Statistic RV Percent Paced: 0 %
Date Time Interrogation Session: 20251126015100
HighPow Impedance: 100 Ohm
Implantable Lead Connection Status: 753985
Implantable Lead Implant Date: 20220128
Implantable Lead Location: 753860
Implantable Lead Model: 673
Implantable Lead Serial Number: 151771
Implantable Pulse Generator Implant Date: 20220128
Lead Channel Impedance Value: 374 Ohm
Lead Channel Setting Pacing Amplitude: 2.5 V
Lead Channel Setting Pacing Pulse Width: 0.4 ms
Lead Channel Setting Sensing Sensitivity: 0.5 mV
Pulse Gen Serial Number: 289752
Zone Setting Status: 755011

## 2024-11-19 NOTE — Progress Notes (Signed)
 Remote ICD Transmission

## 2024-12-01 ENCOUNTER — Other Ambulatory Visit: Payer: Self-pay

## 2024-12-03 MED ORDER — EMPAGLIFLOZIN 10 MG PO TABS: 10.0000 mg | ORAL_TABLET | Freq: Every day | ORAL | 0 refills | Status: AC

## 2024-12-05 ENCOUNTER — Ambulatory Visit: Payer: Self-pay | Admitting: Cardiology

## 2024-12-20 NOTE — Progress Notes (Unsigned)
" °  Electrophysiology Office Follow up Visit Note:    Date:  12/22/2024   ID:  Larry [name], DOB 16-Aug-1961, MRN [id number]  PCP:  Geofm Franky CROME., MD  [facility] Cardiologist:  Wilbert Bihari, MD  [facility] Electrophysiologist:  OLE ONEIDA HOLTS, MD    Interval History:     Larry [name] is a 64 y.o. male who presents for a follow up visit.   He was last seen by [facility] 11/22/2023. He has a history of ICM, ICD, COPD, HTN, DM, CAD, HFrEF with an ICD in situ. He was doing poorly at the last appt. He saw the HF clinic.  He is with his wife today in clinic.  He reports general decline in his health.  Decreased exercise capacity.  Does not report swelling.  He continues to smoke at least 1 pack/day.      Past medical, surgical, social and family history were reviewed.  ROS:   Please see the history of present illness.    All other systems reviewed and are negative.  EKGs/Labs/Other Studies Reviewed:    The following studies were reviewed today:  12/23/2023 in clinic device interrogation personally reviewed. Battery and lead parameters stable.  No evidence of volume accumulation.   EKG Interpretation Date/Time:  Monday December 22 2024 09:58:43 EST Ventricular Rate:  72 PR Interval:  156 QRS Duration:  108 QT Interval:  418 QTC Calculation: 457 R Axis:   8  Text Interpretation: Normal sinus rhythm Normal ECG Confirmed by Holts Ole [phone]) on 12/22/2024 10:05:29 AM    Physical Exam:    VS:  BP 110/66 (BP Location: Left Arm, Patient Position: Sitting, Cuff Size: Normal)   Pulse 72   Ht 5' 10 (1.778 m)   Wt 211 lb (95.7 kg)   BMI 30.28 kg/m     Wt Readings from Last 3 Encounters:  12/22/24 211 lb (95.7 kg)  03/11/24 230 lb (104.3 kg)  11/22/23 235 lb (106.6 kg)     GEN: no distress CARD: RRR, No MRG. Generator pocket well healed. RESP: No IWOB. CTAB.      ASSESSMENT:    1. Ischemic cardiomyopathy   2. Coronary artery disease involving native  coronary artery of native heart without angina pectoris   3. Chronic HFrEF (heart failure with reduced ejection fraction) (HCC)   4. Cardiac defibrillator in situ    PLAN:    In order of problems listed above:  #HFrEF #ICM #ICD in situ NYHA III. Warm and dry on exam. Continue GDMT ICD functioning appropriately, continue remote monitoring I am going to get him plugged into the heart failure clinic for further evaluation.  Unclear if his symptoms are related to his cardiomyopathy or MS potentially?  #CAD No ischemic symptoms today.  I discussed my upcoming departure from [facility] during today's clinic appointment.  The patient will continue to follow-up with one of my EP partners moving forward.  Follow up 1 year with APP.    Signed, Ole Holts, MD, [facility], [facility] 12/22/2024 10:17 AM    Electrophysiology [facility] "

## 2024-12-22 ENCOUNTER — Ambulatory Visit: Payer: Self-pay | Admitting: Cardiology

## 2024-12-22 ENCOUNTER — Ambulatory Visit: Attending: Cardiology | Admitting: Cardiology

## 2024-12-22 ENCOUNTER — Encounter: Payer: Self-pay | Admitting: Cardiology

## 2024-12-22 VITALS — BP 110/66 | HR 72 | Ht 70.0 in | Wt 211.0 lb

## 2024-12-22 DIAGNOSIS — I255 Ischemic cardiomyopathy: Secondary | ICD-10-CM | POA: Diagnosis not present

## 2024-12-22 DIAGNOSIS — I2510 Atherosclerotic heart disease of native coronary artery without angina pectoris: Secondary | ICD-10-CM

## 2024-12-22 DIAGNOSIS — Z95810 Presence of automatic (implantable) cardiac defibrillator: Secondary | ICD-10-CM

## 2024-12-22 DIAGNOSIS — I5022 Chronic systolic (congestive) heart failure: Secondary | ICD-10-CM | POA: Diagnosis not present

## 2024-12-22 LAB — CUP PACEART INCLINIC DEVICE CHECK
Brady Statistic RV Percent Paced: 1 % — CL
Date Time Interrogation Session: 20260105104652
HighPow Impedance: 99 Ohm
Implantable Lead Connection Status: 753985
Implantable Lead Implant Date: 20220128
Implantable Lead Location: 753860
Implantable Lead Model: 673
Implantable Lead Serial Number: 151771
Implantable Pulse Generator Implant Date: 20220128
Lead Channel Impedance Value: 393 Ohm
Lead Channel Pacing Threshold Amplitude: 0.7 V
Lead Channel Pacing Threshold Pulse Width: 0.4 ms
Lead Channel Sensing Intrinsic Amplitude: 11.7 mV
Lead Channel Setting Pacing Amplitude: 2.5 V
Lead Channel Setting Pacing Pulse Width: 0.4 ms
Lead Channel Setting Sensing Sensitivity: 0.5 mV
Pulse Gen Serial Number: 289752
Zone Setting Status: 755011

## 2024-12-22 NOTE — Patient Instructions (Addendum)
 Medication Instructions:  Your physician recommends that you continue on your current medications as directed. Please refer to the Current Medication list given to you today.  *If you need a refill on your cardiac medications before your next appointment, please call your pharmacy*  Follow-Up: At [facility], you and your health needs are our priority.  As part of our continuing mission to provide you with exceptional heart care, our providers are all part of one team.  This team includes your primary Cardiologist (physician) and Advanced Practice Providers or APPs (Physician Assistants and Nurse Practitioners) who all work together to provide you with the care you need, when you need it.  Your next appointment:   1 year  Provider:   You will see one of the following Advanced Practice Providers on your designated Care Team:   Charlies Arthur, [place] Ozell Jodie Passey, PA-C Suzann Riddle, NP Daphne Barrack, NP Artist Pouch, PA-C    Other Instructions Follow up with Advanced Heart Failure Clinic

## 2025-01-01 ENCOUNTER — Encounter: Payer: Self-pay | Admitting: Cardiology

## 2025-01-09 ENCOUNTER — Encounter: Payer: Self-pay | Admitting: Cardiology

## 2025-01-09 ENCOUNTER — Telehealth (HOSPITAL_BASED_OUTPATIENT_CLINIC_OR_DEPARTMENT_OTHER): Payer: Self-pay | Admitting: *Deleted

## 2025-01-09 NOTE — Telephone Encounter (Signed)
" ° °  Name: Larry [name]  DOB: Dec 04, 1961  MRN: [id number]  Primary Cardiologist: Wilbert Bihari, MD  Chart reviewed as part of pre-operative protocol coverage. Because of Larry [name]'s past medical history and time since last visit, he will require a follow-up in-office visit in order to better assess preoperative cardiovascular risk.  Patient already has appointment with Dr. Cherrie on 01/13/25.   Pre-op covering staff: - Please schedule appointment and call patient to inform them. If patient already had an upcoming appointment within acceptable timeframe, please add pre-op clearance to the appointment notes so provider is aware. - Please contact requesting surgeon's office via preferred method (i.e, phone, fax) to inform them of need for appointment prior to surgery.   Rollo FABIENE Louder, PA-C  01/09/2025, 10:38 AM   "

## 2025-01-09 NOTE — Telephone Encounter (Signed)
"  ° °  Pre-operative Risk Assessment    Patient Name: Larry [name]  DOB: [birth year] MRN: [id number]   Date of last office visit: 09/21/25 DR. CINDIE Date of next office visit: 01/13/25 DR. CHERRIE; 01/28/25 DR. TURNER  Request for Surgical Clearance    Procedure:  Dental Extraction - Amount of Teeth to be Pulled:  2 TEETH FOR SURGICAL EXTRACTIONS (# 19 AND 21) ALONG WITH A DEEP DENTAL CLEANING; WILL ALSO BE REMOVING IMPLANT (#13)  Date of Surgery:  Clearance TBD                                Surgeon:  DR. THOM [name], DDS Surgeon's Group or Practice Name:  Larry [name], DDS, [facility] Phone number:  [phone] Fax number:  [phone]   Type of Clearance Requested:   - Medical  - Pharmacy:  Hold Aspirin  and Clopidogrel  (Plavix )   ALSO WILL NEED DEVICE CLEARANCE FOR DEFIBRILLATOR PER FORM   Type of Anesthesia:  Local    Additional requests/questions:    Larry [name]   01/09/2025, 10:31 AM   "

## 2025-01-09 NOTE — Progress Notes (Unsigned)
 PERIOPERATIVE PRESCRIPTION FOR IMPLANTED CARDIAC DEVICE PROGRAMMING  Patient Information: Name:  Larry [name]  DOB:  Jul 31, 1961  MRN:  [id number]  Procedure:  Dental Extraction - Amount of Teeth to be Pulled:  2 TEETH FOR SURGICAL EXTRACTIONS (# 19 AND 21) ALONG WITH A DEEP DENTAL CLEANING; WILL ALSO BE REMOVING IMPLANT (#13)   Date of Surgery:  Clearance TBD                                  Surgeon:  DR. THOM GAINER, DDS Surgeon's Group or Practice Name:  [facility] Phone number:  [phone] Fax number:  [phone]   Type of Clearance Requested:   - Medical  - Pharmacy:  Hold Aspirin  and Clopidogrel  (Plavix )   ALSO WILL NEED DEVICE CLEARANCE FOR DEFIBRILLATOR PER FORM   Type of Anesthesia:  Local  Device Information:  Clinic EP Physician:  DR. FONDA KITTY   Device Type:  Defibrillator Manufacturer and Phone #:  [employer]: [phone] Pacemaker Dependent?:  No. Date of Last Device Check:  12/22/24 IN CLINIC  Normal Device Function?:  Yes.    Electrophysiologist's Recommendations:  Have magnet available. Provide continuous ECG monitoring when magnet is used or reprogramming is to be performed.  Procedure will likely interfere with device function.  Device should be programmed:  Tachy therapies disabled  Per Device Clinic Standing Orders, [name], RN  12:32 PM 01/09/2025

## 2025-01-09 NOTE — Telephone Encounter (Signed)
 Preop clearance has been added to appt notes for appt on 01/13/25 with Dr. Bensimhon

## 2025-01-13 ENCOUNTER — Ambulatory Visit (HOSPITAL_COMMUNITY): Admission: RE | Admit: 2025-01-13 | Source: Ambulatory Visit | Admitting: Internal Medicine

## 2025-01-14 NOTE — Progress Notes (Addendum)
 "  ADVANCED HF CLINIC NOTE  Primary Care: Geofm Franky CROME., MD Primary Cardiologist: Wilbert Bihari, MD HF Cardiologist: Dr. Cherrie  HPI: Larry [name] is a 64 y.o. male with obesity, multiple sclerosis, COPD with ongoing tobacco use, HTN, DM2, OSA, CAD with DES mid LAD and chronic systolic heart failure due to iCM, s/p BSci ICD  Admitted 9/21 acute hypoxemia respiratory failure +  syncope. + RSV. Echo showed EF 40-45%. Cardiology consulted and placed holter monitor and set up for outpatient Coronary CT.   + Coronary CT --> had LHC in 09/2021 with mid RCA 100%, mid LAD 70%-->DES, 2nd diag lesion 70% lesion.   11/21 he was referred to Dr Cindie for syncope consultation. The patient described 2 different syncopal episodes with one concerning for orthostasis and the other concerning for arrhythmia. CMRI was ordered, showing LVEF < 35% and large scar burden.   Underwent single chamber BoSCI 1/22  Referred to HF Clinic in 11/22. Felt limitations was multifactorial and felt not to be candidate for advanced therapies. Recommended:  1) change midodrine  to 2.5 tid (from 5 mg daily) 2) work on smoking cessation 3) attempt to walk for 5 minutes 2-3x/day around the house with goal of 10 min/3xday 4) refer to outpatient PT 5) start Jardiance  10 mg daily  Last seen by Dr Bensimhon 12/2021, not a candidate for advanced therapies and had little else to offer --> graduated from AHF clinic.  Echo 4/23: EF 30-35%, RV ok  Echo 7/24 EF 30-35%, G2DD, RV ok  Echo 4/25: EF 25-30%, RV ok  Today he returns for HF follow up with his wife. He was referred back to AHF by Dr. Cindie due to general decline in health. Unclear if this is related to HF vs MS. Overall not feeling well. Able to walk short distances on flat ground, but feels wore out. Hands & feet are numb and cold. He feels he is just slowing down. He has occasional positional dizziness, no recent falls or syncope. Wife says last year he was able to  do some yardwork, now it's hard for him to even get out of the house. Denies palpitations, abnormal bleeding, CP, edema, or PND/Orthopnea. Appetite fair, has lost 20+ lbs. Taking all medications. Smokes >1 ppd, occasional THC, no ETOH or hard drugs. Needs pre-op clearance for dental extractions.  Cardiac Testing - Echo 4/25: EF 25-30%, RV ok - Echo 7/24 EF 30-35%, G2DD, RV ok - Echo 4/23: EF 30-35%, RV ok - CMRI  12/21: LVEF is severely reduced at 30% and there is significant burden of transmural myocardial scarring (poor chance of recovery) in 7 segments (~ 40% of left ventricular myocardium).  - Coronary CT 10/22: CAD-RADS 4 Severe stenosis. (70-99%). Cardiac catheterization is recommended. - Echo 8/22: EF 30-35% , HK LV, RV normal - Echo 12/21: EF 30-35%, RV normal   - LHC 12/21: Mid RCA lesion is 100% stenosed. Chronic total occlusion. Right to right and left to right collaterals. Mid LAD lesion is 70% stenosed. DFR 0.85. A drug-eluting stent was successfully placed using a SYNERGY XD 3.0X28. Post intervention, there is a 0% residual stenosis. 2nd Diag lesion is 70% stenosed. This is a small branch vessel, too small for stent. There is moderate left ventricular systolic dysfunction. The left ventricular ejection fraction is 35-45% by visual estimate. LV end diastolic pressure is normal. There is no aortic valve stenosis.  Past Medical History:  Diagnosis Date   Arthritis    Chronic systolic CHF (congestive heart  failure) (HCC) 04/09/2022   Coronary artery disease involving native coronary artery of native heart without angina pectoris    GERD without esophagitis 09/12/2020   Major depressive disorder 09/12/2020   Mixed hyperlipidemia 09/12/2020   Multiple sclerosis 09/12/2020   Nerve pain    Thrombocytopenia 08/28/2017   Current Outpatient Medications  Medication Sig Dispense Refill   acetaminophen  (TYLENOL ) 500 MG tablet Take 500 mg by mouth every 6 (six) hours as needed for mild  pain, moderate pain or headache.     albuterol  (VENTOLIN  HFA) 108 (90 Base) MCG/ACT inhaler Inhale 2 puffs into the lungs every 6 (six) hours as needed for wheezing or shortness of breath. 8 g 2   aspirin  EC 81 MG tablet Take 81 mg by mouth daily. Swallow whole.     atorvastatin  (LIPITOR) 80 MG tablet Take 1 tablet (80 mg total) by mouth daily. 90 tablet 3   Cholecalciferol 125 MCG (5000 UT) TABS Take by mouth daily.     clopidogrel  (PLAVIX ) 75 MG tablet Take 1 tablet (75 mg total) by mouth daily. 90 tablet 3   cyanocobalamin (VITAMIN B12) 1000 MCG tablet Take by mouth daily.     Diroximel Fumarate  (VUMERITY ) 231 MG CPDR Take 462 mg by mouth in the morning and at bedtime.      DULoxetine  (CYMBALTA ) 60 MG capsule Take 60 mg by mouth daily.  5   empagliflozin  (JARDIANCE ) 10 MG TABS tablet Take 1 tablet (10 mg total) by mouth daily. 90 tablet 0   methocarbamol  (ROBAXIN ) 750 MG tablet Take 750 mg by mouth 3 (three) times daily. Scheduled     midodrine  (PROAMATINE ) 5 MG tablet Take 1 tablet (5 mg total) by mouth 3 (three) times daily with meals. 270 tablet 3   Multiple Vitamin (MULTIVITAMIN WITH MINERALS) TABS tablet Take 1 tablet by mouth daily.     nitroGLYCERIN  (NITROSTAT ) 0.4 MG SL tablet Place 1 tablet (0.4 mg total) under the tongue every 5 (five) minutes as needed for chest pain. 25 tablet 3   pantoprazole  (PROTONIX ) 40 MG tablet Take 1 tablet (40 mg total) by mouth daily. 90 tablet 3   pregabalin  (LYRICA ) 200 MG capsule Take 200 mg by mouth 3 (three) times daily.     Pseudoephedrine-APAP-DM (DAYQUIL MULTI-SYMPTOM COLD/FLU PO) Take by mouth 2 (two) times daily as needed (cough, upper respiratory symptoms).     WIXELA INHUB 100-50 MCG/ACT AEPB Inhale into the lungs as needed (copd).     zolpidem  (AMBIEN ) 10 MG tablet Take 10 mg by mouth at bedtime.     ezetimibe  (ZETIA ) 10 MG tablet Take 1 tablet (10 mg total) by mouth daily. (Patient not taking: Reported on 01/16/2025) 90 tablet 3   No current  facility-administered medications for this encounter.   Allergies  Allergen Reactions   Aspirin  Other (See Comments)    Nose Bleeding (with high doses)   Social History   Socioeconomic History   Marital status: Married    Spouse name: Not on file   Number of children: Not on file   Years of education: Not on file   Highest education level: Not on file  Occupational History   Not on file  Tobacco Use   Smoking status: Every Day    Types: Cigarettes   Smokeless tobacco: Never  Substance and Sexual Activity   Alcohol use: No   Drug use: No   Sexual activity: Not on file  Other Topics Concern   Not on file  Social History  Narrative   Not on file   Social Drivers of Health   Tobacco Use: High Risk (12/22/2024)   Patient History    Smoking Tobacco Use: Every Day    Smokeless Tobacco Use: Never    Passive Exposure: Not on file  Financial Resource Strain: Low Risk (03/24/2024)   Received from [facility]   Overall Financial Resource Strain (CARDIA)    Difficulty of Paying Living Expenses: Not hard at all  Food Insecurity: No Food Insecurity (03/24/2024)   Received from [facility]   Epic    Within the past 12 months, you worried that your food would run out before you got the money to buy more.: Never true    Within the past 12 months, the food you bought just didn't last and you didn't have money to get more.: Never true  Transportation Needs: No Transportation Needs (03/24/2024)   Received from [facility] - Transportation    Lack of Transportation (Medical): No    Lack of Transportation (Non-Medical): No  Physical Activity: Unknown (03/24/2024)   Received from [facility]   Exercise Vital Sign    On average, how many days per week do you engage in moderate to strenuous exercise (like a brisk walk)?: 0 days    Minutes of Exercise per Session: Not on file  Stress: No Stress Concern Present (04/09/2024)   Received from [facility] of  Occupational Health - Occupational Stress Questionnaire    Feeling of Stress : Only a little  Recent Concern: Stress - Stress Concern Present (03/24/2024)   Received from [facility] of Occupational Health - Occupational Stress Questionnaire    Feeling of Stress : Rather much  Social Connections: Patient Declined (03/24/2024)   Received from [facility]   Social Network    How would you rate your social network (family, work, friends)?: Patient declined  Intimate Partner Violence: Not At Risk (03/24/2024)   Received from [facility]   HITS    Over the last 12 months how often did your partner physically hurt you?: Never    Over the last 12 months how often did your partner insult you or talk down to you?: Never    Over the last 12 months how often did your partner threaten you with physical harm?: Never    Over the last 12 months how often did your partner scream or curse at you?: Never  Depression (PHQ2-9): Not on file  Alcohol Screen: Not on file  Housing: Low Risk (03/24/2024)   Received from [facility]    In the last 12 months, was there a time when you were not able to pay the mortgage or rent on time?: No    In the past 12 months, how many times have you moved where you were living?: 0    At any time in the past 12 months, were you homeless or living in a shelter (including now)?: No  Utilities: Not At Risk (03/24/2024)   Received from [facility] Utilities    Threatened with loss of utilities: No  Health Literacy: Not on file   Family History  Problem Relation Age of Onset   Heart attack Mother 47   Lung cancer Neg Hx    Wt Readings from Last 3 Encounters:  01/16/25 93.4 kg (206 lb)  12/22/24 95.7 kg (211 lb)  03/11/24 104.3 kg (230 lb)   BP  128/84   Pulse 79   Wt 93.4 kg (206 lb)   SpO2 95%   BMI 29.56 kg/m   PHYSICAL EXAM: General:  NAD. No resp difficulty, arrived in WC, fatigued-appearing HEENT: Normal Neck: Supple. No  JVD. Cor: Regular rate & rhythm. No rubs, gallops or murmurs. Lungs: Diminished Abdomen: Soft, nontender, nondistended.  Extremities: No cyanosis, clubbing, rash, edema Neuro: Alert & oriented x 3, moves all 4 extremities w/o difficulty. Affect pleasant.  ReDs reading: 29 %, normal  ASSESSMENT & PLAN: 1. Chronic Systolic Heart Failure - CMRI 87/78: LVEF is severely reduced at 30% and there is significant burden of transmural myocardial scarring (poor chance of recovery) in 7 segments (~ 40% of left ventricular myocardium).  - s/p [employer] ICD placed 1/22 - Echo 8/22: EF 30-35% , HK LV, RV normal - Echo 4/23: EF 30-35%, RV ok - Echo 7/24 EF 30-35%, G2DD, RV ok - Echo 4/25: EF 25-30%, RV ok - NYHA III, functional class confounded by COPD and MS. Volume OK today, ReDs normal at 29% - Unable to initiate additional GDMT with need for midodrine  and orthostasis - Continue midodrine  5 mg tid  - Continue Jardiance  10 mg daily. - Off losartan  with orthostasis - Continue compression hose - He is not candidate for advanced therapies. - Long discussion with Larry [name] and his wife today about HF trajectory. Current symptoms may be related to HF, but he also has significant COPD and MS, both of which limit him physically. Not a transplant candidate with tobacco use, and not an LVAD candidate with adv COPD and poor mobility. We discussed possibility of CR to see if we could improve his functional status, but Larry [name] is not sure he would be able to participate. - At this point we have little left to offer him in the AHF clinic. Will have him f/u with Dr. Shlomo, he is optimized from a HF standpoint and on the most GDMT he can tolerate.  2. CAD - LHC 09/2020 -  mid RCA 100%, mid LAD 70%-->DES, 2nd diag lesion 70% lesion. - No chest pain - Continue statin - On ASA + Plavix , consider stopping ASA but will defer to Dr. Shlomo  3. DM II - Well-controlled, hgb A1c 5.6 - Continue Jardiance    4. OSA -  Unable to tolerate CPAP - Follows with Dr. Shlomo  5. MS  - Disabled since 2017.  - Follows with Neurology  6. COPD - On Wixela - Smokes >1 ppd, discussed cessation  7. Pre-Op CV Risk Stratification - Needs dental extractions - OK from CV standpoint for dental procedure.  - OK to hold ASA + Plavix  5-7 days before dental procedures, resume post-procedure as soon as safe to do so.  Follow up PRN in AHF clinic. Discussed with Dr. Bensimhon  Spent > 40 minutes today discussing advanced therapies, medications, heart failure and GOC/EOL discussions.  Harlene CHRISTELLA Gainer, FNP  2:44 PM "

## 2025-01-16 ENCOUNTER — Telehealth (HOSPITAL_COMMUNITY): Payer: Self-pay

## 2025-01-16 ENCOUNTER — Encounter (HOSPITAL_COMMUNITY): Payer: Self-pay

## 2025-01-16 ENCOUNTER — Ambulatory Visit (HOSPITAL_COMMUNITY)
Admission: RE | Admit: 2025-01-16 | Discharge: 2025-01-16 | Disposition: A | Source: Ambulatory Visit | Attending: Family Medicine

## 2025-01-16 VITALS — BP 128/84 | HR 79 | Wt 206.0 lb

## 2025-01-16 DIAGNOSIS — I5022 Chronic systolic (congestive) heart failure: Secondary | ICD-10-CM | POA: Diagnosis present

## 2025-01-16 DIAGNOSIS — J449 Chronic obstructive pulmonary disease, unspecified: Secondary | ICD-10-CM | POA: Diagnosis not present

## 2025-01-16 DIAGNOSIS — I110 Hypertensive heart disease with heart failure: Secondary | ICD-10-CM | POA: Insufficient documentation

## 2025-01-16 DIAGNOSIS — G4733 Obstructive sleep apnea (adult) (pediatric): Secondary | ICD-10-CM | POA: Insufficient documentation

## 2025-01-16 DIAGNOSIS — Z79899 Other long term (current) drug therapy: Secondary | ICD-10-CM | POA: Diagnosis not present

## 2025-01-16 DIAGNOSIS — G35D Multiple sclerosis, unspecified: Secondary | ICD-10-CM | POA: Insufficient documentation

## 2025-01-16 DIAGNOSIS — F17210 Nicotine dependence, cigarettes, uncomplicated: Secondary | ICD-10-CM | POA: Insufficient documentation

## 2025-01-16 DIAGNOSIS — I2510 Atherosclerotic heart disease of native coronary artery without angina pectoris: Secondary | ICD-10-CM | POA: Diagnosis not present

## 2025-01-16 DIAGNOSIS — E119 Type 2 diabetes mellitus without complications: Secondary | ICD-10-CM | POA: Diagnosis not present

## 2025-01-16 DIAGNOSIS — Z7902 Long term (current) use of antithrombotics/antiplatelets: Secondary | ICD-10-CM | POA: Insufficient documentation

## 2025-01-16 DIAGNOSIS — Z95810 Presence of automatic (implantable) cardiac defibrillator: Secondary | ICD-10-CM | POA: Insufficient documentation

## 2025-01-16 DIAGNOSIS — Z955 Presence of coronary angioplasty implant and graft: Secondary | ICD-10-CM | POA: Diagnosis not present

## 2025-01-16 DIAGNOSIS — Z7982 Long term (current) use of aspirin: Secondary | ICD-10-CM | POA: Diagnosis not present

## 2025-01-16 DIAGNOSIS — Z7984 Long term (current) use of oral hypoglycemic drugs: Secondary | ICD-10-CM | POA: Insufficient documentation

## 2025-01-16 DIAGNOSIS — Z01818 Encounter for other preprocedural examination: Secondary | ICD-10-CM

## 2025-01-16 NOTE — Patient Instructions (Signed)
 Thank you for your visit today.  There has been no changes to your medications   PLEASE FOLLOW UP WITH DR. SHLOMO.  At the [facility], you and your health needs are our priority. As part of our continuing mission to provide you with exceptional heart care, we have created designated Provider Care Teams. These Care Teams include your primary Cardiologist (physician) and Advanced Practice Providers (APPs- Physician Assistants and Nurse Practitioners) who all work together to provide you with the care you need, when you need it.   You may see any of the following providers on your designated Care Team at your next follow up: Dr Toribio Fuel Dr Ezra Shuck Dr. Morene Brownie Greig Mosses, NP Caffie Shed, [place] [facility] [place], [place] Beckey Coe, NP Jordan Lee, NP Ellouise Class, NP Tinnie Redman, PharmD Jaun Bash, PharmD   Please be sure to bring in all your medications bottles to every appointment.    Thank you for choosing [facility]

## 2025-01-16 NOTE — Addendum Note (Signed)
 Encounter addended by: Glena Harlene HERO, FNP on: 01/16/2025 4:33 PM  Actions taken: Clinical Note Signed

## 2025-01-16 NOTE — Telephone Encounter (Signed)
 Called  and spoke to pt's wife Angie to confirm/remind patient of their appointment at the [facility] on 01/16/25.   Appointment:   [x] Confirmed  [] Left mess   [] No answer/No voice mail  [] VM Full/unable to leave message  [] Phone not in service  Patient reminded to bring all medications and/or complete list.  Confirmed patient has transportation. Gave directions, instructed to utilize valet parking.

## 2025-01-19 NOTE — Telephone Encounter (Signed)
 Per preop APP Josefa Beauvais, FNP: Message Received: Today Cleaver, Josefa HERO, NP  Wynetta Niels HERO, CMA Please forward heart failure office note to requesting office for planned dental extraction.  Thank you for your help.  Josefa HERO. Cleaver NP-C  [image]   01/19/2025, 7:07 AM [facility] [address] [place], [place] [address] Office [phone]

## 2025-01-28 ENCOUNTER — Ambulatory Visit: Admitting: Cardiology

## 2025-02-11 ENCOUNTER — Ambulatory Visit

## 2025-05-13 ENCOUNTER — Ambulatory Visit

## 2025-08-12 ENCOUNTER — Ambulatory Visit
